# Patient Record
Sex: Female | Born: 1965 | Race: White | Hispanic: No | Marital: Married | Smoking: Never smoker
Health system: Southern US, Community
[De-identification: ages and names within clinical notes are randomized; demographics above are authoritative.]

## PROBLEM LIST (undated history)

## (undated) DIAGNOSIS — M069 Rheumatoid arthritis, unspecified: Secondary | ICD-10-CM

## (undated) DIAGNOSIS — E785 Hyperlipidemia, unspecified: Secondary | ICD-10-CM

## (undated) DIAGNOSIS — G459 Transient cerebral ischemic attack, unspecified: Secondary | ICD-10-CM

## (undated) DIAGNOSIS — F419 Anxiety disorder, unspecified: Secondary | ICD-10-CM

## (undated) DIAGNOSIS — G47419 Narcolepsy without cataplexy: Principal | ICD-10-CM

## (undated) HISTORY — PX: OTHER SURGICAL HISTORY: SHX169

## (undated) HISTORY — PX: WISDOM TOOTH EXTRACTION: SHX21

## (undated) HISTORY — DX: Hyperlipidemia, unspecified: E78.5

## (undated) HISTORY — DX: Transient cerebral ischemic attack, unspecified: G45.9

## (undated) HISTORY — DX: Narcolepsy without cataplexy: G47.419

---

## 1998-12-26 ENCOUNTER — Other Ambulatory Visit: Admission: RE | Admit: 1998-12-26 | Discharge: 1998-12-26 | Payer: Self-pay | Admitting: Obstetrics and Gynecology

## 1999-12-18 ENCOUNTER — Other Ambulatory Visit: Admission: RE | Admit: 1999-12-18 | Discharge: 1999-12-18 | Payer: Self-pay | Admitting: Obstetrics and Gynecology

## 2000-12-30 ENCOUNTER — Other Ambulatory Visit: Admission: RE | Admit: 2000-12-30 | Discharge: 2000-12-30 | Payer: Self-pay | Admitting: Obstetrics and Gynecology

## 2001-08-25 ENCOUNTER — Other Ambulatory Visit: Admission: RE | Admit: 2001-08-25 | Discharge: 2001-08-25 | Payer: Self-pay | Admitting: Obstetrics and Gynecology

## 2002-03-30 ENCOUNTER — Inpatient Hospital Stay (HOSPITAL_COMMUNITY): Admission: AD | Admit: 2002-03-30 | Discharge: 2002-04-03 | Payer: Self-pay | Admitting: Obstetrics and Gynecology

## 2002-05-01 ENCOUNTER — Other Ambulatory Visit: Admission: RE | Admit: 2002-05-01 | Discharge: 2002-05-01 | Payer: Self-pay | Admitting: Obstetrics and Gynecology

## 2002-10-17 ENCOUNTER — Emergency Department (HOSPITAL_COMMUNITY): Admission: EM | Admit: 2002-10-17 | Discharge: 2002-10-17 | Payer: Self-pay | Admitting: Emergency Medicine

## 2002-12-25 ENCOUNTER — Ambulatory Visit (HOSPITAL_COMMUNITY): Admission: RE | Admit: 2002-12-25 | Discharge: 2002-12-25 | Payer: Self-pay | Admitting: Gastroenterology

## 2003-04-30 ENCOUNTER — Ambulatory Visit (HOSPITAL_BASED_OUTPATIENT_CLINIC_OR_DEPARTMENT_OTHER): Admission: RE | Admit: 2003-04-30 | Discharge: 2003-04-30 | Payer: Self-pay | Admitting: Neurology

## 2003-05-14 ENCOUNTER — Other Ambulatory Visit: Admission: RE | Admit: 2003-05-14 | Discharge: 2003-05-14 | Payer: Self-pay | Admitting: Obstetrics and Gynecology

## 2003-08-06 ENCOUNTER — Encounter (INDEPENDENT_AMBULATORY_CARE_PROVIDER_SITE_OTHER): Payer: Self-pay | Admitting: Specialist

## 2003-08-06 ENCOUNTER — Ambulatory Visit (HOSPITAL_BASED_OUTPATIENT_CLINIC_OR_DEPARTMENT_OTHER): Admission: RE | Admit: 2003-08-06 | Discharge: 2003-08-06 | Payer: Self-pay | Admitting: Surgery

## 2003-08-06 ENCOUNTER — Ambulatory Visit (HOSPITAL_COMMUNITY): Admission: RE | Admit: 2003-08-06 | Discharge: 2003-08-06 | Payer: Self-pay | Admitting: Surgery

## 2004-08-07 ENCOUNTER — Encounter: Admission: RE | Admit: 2004-08-07 | Discharge: 2004-08-07 | Payer: Self-pay | Admitting: Gastroenterology

## 2004-09-04 ENCOUNTER — Encounter: Admission: RE | Admit: 2004-09-04 | Discharge: 2004-09-04 | Payer: Self-pay | Admitting: Gastroenterology

## 2005-07-28 ENCOUNTER — Inpatient Hospital Stay (HOSPITAL_COMMUNITY): Admission: RE | Admit: 2005-07-28 | Discharge: 2005-07-31 | Payer: Self-pay | Admitting: Obstetrics and Gynecology

## 2007-10-06 HISTORY — PX: COLONOSCOPY: SHX174

## 2008-12-11 ENCOUNTER — Encounter: Admission: RE | Admit: 2008-12-11 | Discharge: 2008-12-11 | Payer: Self-pay | Admitting: Obstetrics and Gynecology

## 2008-12-18 ENCOUNTER — Observation Stay (HOSPITAL_COMMUNITY): Admission: EM | Admit: 2008-12-18 | Discharge: 2008-12-18 | Payer: Self-pay | Admitting: Emergency Medicine

## 2010-01-14 ENCOUNTER — Encounter: Admission: RE | Admit: 2010-01-14 | Discharge: 2010-01-14 | Payer: Self-pay | Admitting: Obstetrics and Gynecology

## 2011-01-15 LAB — BASIC METABOLIC PANEL
BUN: 17 mg/dL (ref 6–23)
CO2: 28 mEq/L (ref 19–32)
Calcium: 9.1 mg/dL (ref 8.4–10.5)
Chloride: 102 mEq/L (ref 96–112)
Creatinine, Ser: 0.84 mg/dL (ref 0.4–1.2)
GFR calc Af Amer: >60 mL/min (ref >60)
GFR calc non Af Amer: >60 mL/min (ref >60)
Glucose, Bld: 73 mg/dL (ref 70–99)
Potassium: 3.3 mEq/L — ABNORMAL LOW (ref 3.5–5.1)
Sodium: 137 mEq/L (ref 135–145)

## 2011-01-15 LAB — URINE MICROSCOPIC-ADD ON

## 2011-01-15 LAB — POCT I-STAT, CHEM 8
BUN: 19 mg/dL (ref 6–23)
Calcium, Ion: 1.21 mmol/L (ref 1.12–1.32)
Chloride: 104 mEq/L (ref 96–112)
Creatinine, Ser: 1 mg/dL (ref 0.4–1.2)
Glucose, Bld: 79 mg/dL (ref 70–99)
HCT: 50 % — ABNORMAL HIGH (ref 36.0–46.0)
Hemoglobin: 17 g/dL — ABNORMAL HIGH (ref 12.0–15.0)
Potassium: 3.4 mEq/L — ABNORMAL LOW (ref 3.5–5.1)
Sodium: 137 mEq/L (ref 135–145)
TCO2: 27 mmol/L (ref 0–100)

## 2011-01-15 LAB — CBC
HCT: 47.1 % — ABNORMAL HIGH (ref 36.0–46.0)
Hemoglobin: 16.7 g/dL — ABNORMAL HIGH (ref 12.0–15.0)
MCHC: 35.5 g/dL (ref 30.0–36.0)
MCV: 85.1 fL (ref 78.0–100.0)
Platelets: 317 10*3/uL (ref 150–400)
RBC: 5.53 MIL/uL — ABNORMAL HIGH (ref 3.87–5.11)
RDW: 12 % (ref 11.5–15.5)
WBC: 8 10*3/uL (ref 4.0–10.5)

## 2011-01-15 LAB — URINALYSIS, ROUTINE W REFLEX MICROSCOPIC
Glucose, UA: NEGATIVE mg/dL
Hgb urine dipstick: NEGATIVE
Ketones, ur: 15 mg/dL — AB
Nitrite: NEGATIVE
Protein, ur: 30 mg/dL — AB
Specific Gravity, Urine: 1.038 — ABNORMAL HIGH (ref 1.005–1.030)
Urobilinogen, UA: 1 mg/dL (ref 0.0–1.0)
pH: 6.5 (ref 5.0–8.0)

## 2011-01-15 LAB — DIFFERENTIAL
Basophils Absolute: 0 10*3/uL (ref 0.0–0.1)
Basophils Relative: 1 % (ref 0–1)
Eosinophils Absolute: 0.2 10*3/uL (ref 0.0–0.7)
Eosinophils Relative: 2 % (ref 0–5)
Lymphocytes Relative: 21 % (ref 12–46)
Lymphs Abs: 1.7 10*3/uL (ref 0.7–4.0)
Monocytes Absolute: 0.8 10*3/uL (ref 0.1–1.0)
Monocytes Relative: 10 % (ref 3–12)
Neutro Abs: 5.3 10*3/uL (ref 1.7–7.7)
Neutrophils Relative %: 66 % (ref 43–77)

## 2011-01-15 LAB — MAGNESIUM: Magnesium: 1.9 mg/dL (ref 1.5–2.5)

## 2011-02-20 NOTE — H&P (Signed)
[facility]  Patient:    Kathleen Alvarez, Kathleen Alvarez Visit Number: [id number] MRN: [id number]          Service Type: OBS Location: 910B 9154 01 Attending Physician:  Frederich Balding Dictated by:   Lenoard Aden, M.D. Admit Date:  03/30/2002                           History and Physical  CHIEF COMPLAINT:              Postdate for cervical ripening and induction.  HISTORY OF PRESENT ILLNESS:   A 45 year old white female, G1, P0, EDD of June 19,2003 at 418 weeks gestation, who presents for induction.  PAST OBSTETRIC HISTORY:       Noncontributory.  MEDICATIONS:                  Prenatal vitamins.  ALLERGIES:                    No known drug allergies.  PAST MEDICAL HISTORY:         No medical or surgical hospitalizations.  FAMILY HISTORY:               Colon cancer and adult onset diabetes and hypertension.  PRENATAL LABORATORY DATA:     Blood type B+, Rh antibody negative, rubella immune, hepatitis B surface antigen negative, HIV nonreactive.  GC and Chlamydia negative.  Pregnancy complicated by postdate and questionable borderline pelvimetry.  PHYSICAL EXAMINATION:  GENERAL:                      She is a well-developed, well-nourished, obese white female in no apparent distress.  HEENT:                        Normal.  LUNGS:                        Clear.  HEART:                        Regular rate and rhythm.  ABDOMEN:                      Soft, gravid, nontender.  Estimated fetal weight of 7.5-8 pounds.  PELVIC:                       Cervix is closed, 3 cm, long, vertex, and -2.  EXTREMITIES:                  No cords.  NEUROLOGIC:                   Nonfocal.  IMPRESSION:                   Postdates intrauterine pregnancy with unfavorable cervix for cervical ripening and induction.  PLAN:                         Proceed with cervical ripening and induction. Dictated by:   Lenoard Aden, M.D. Attending Physician:  Frederich Balding DD:  03/30/02 TD:  03/30/02 Job: [id number]

## 2011-02-20 NOTE — Op Note (Signed)
[facility]  Patient:    Kathleen Alvarez, Kathleen Alvarez Visit Number: [id number] MRN: [id number]          Service Type: OBS Location: 910A 9137 01 Attending Physician:  Lenoard Aden Dictated by:   Lenoard Aden, M.D. Proc. Date: 03/31/02 Admit Date:  03/30/2002   CC:         [facility]   Operative Report  PREOPERATIVE DIAGNOSES:       Nonreassuring fetal heart rate tracing, active phase arrest, probable cephalopelvic disproportion.  POSTOPERATIVE DIAGNOSES:      Nonreassuring fetal heart rate tracing, active phase arrest, probable cephalopelvic disproportion.  PROCEDURE:                    Primary low segment transverse cesarean section.  SURGEON:                      Lenoard Aden, M.D.  ANESTHESIA:                   Epidural.  ESTIMATED BLOOD LOSS:         1200 cc.  COMPLICATIONS:                None.  DRAINS:                       Foley.  COUNTS:                       Correct.  FINDINGS:                     Full-term living female occiput anterior position.  Placenta posterior and intact.  Normal tubes and ovaries noted. Three vessel cord noted.  Apgars 8 and 9.  BRIEF OPERATIVE NOTE:         After being apprised of the risks of anesthesia, infection, bleeding, injury to abdominal organs with need for repair, the patient was brought to the operating room where she was administered additional epidural anesthetic without complications.  Prepped and draped in the usual sterile fashion.  Foley catheter previously placed and draining well.  After achieving adequate anesthesia, dilute Marcaine solution placed in the area of Pfannenstiel skin incision which is made with the scalpel and carried down to the fascia which is nicked in the midline and opened transversely using Mayo scissors.  Rectus muscles dissected sharply in the midline.  Peritoneum entered sharply.  Bladder blade placed. Visceroperitoneum is scored in a smile like  fashion.  Kerr hysterotomy incision made for atraumatic delivery of a molded fetal vertex from occiput anterior position.  Full-term living female, Apgars 8 and 9.  Handed to pediatricians who were in attendance after bulb suction.  Placenta delivered manually intact.  Three vessel cord noted.  Uterus exteriorized.  Normal tubes and ovaries noted.  Uterus curetted using a dry lap pack and good hemostasis achieved.  Bladder flap was well away from the lower aspect of the uterine incision.  Uterine incision closed using a 0 Monocryl in a continuous running fashion in two layers.  At this time pericolic gutters are irrigated.  Blood clots subsequently removed.  Two interrupted stitches placed for hemostasis along the lower uterine incision.  At this time bladder flap is reinspected and found to be hemostatic.  Fascia closed using a 0 Vicryl in a continuous running fashion.  Skin closed using staples.  The patient tolerates  procedure well and is transferred to recovery in good condition. Dictated by:   Lenoard Aden, M.D. Attending Physician:  Lenoard Aden DD:  03/31/02 TD:  04/02/02 Job: [id number]

## 2011-02-20 NOTE — Op Note (Signed)
Kathleen Alvarez, Kathleen Alvarez                ACCOUNT NO.:  [id number]   MEDICAL RECORD NO.:  [id number]          PATIENT TYPE:  INP   LOCATION:  9114                          FACILITY:  [facility]   PHYSICIAN:  Lenoard Aden, M.D.DATE OF BIRTH:  [birth year]   DATE OF PROCEDURE:  07/28/2005  DATE OF DISCHARGE:                                 OPERATIVE REPORT   PREOPERATIVE DIAGNOSIS:  39-week intrauterine pregnancy, previous cesarean  section, for repeat.   POSTOPERATIVE DIAGNOSIS:  39-week intrauterine pregnancy, previous cesarean  section, for repeat.   PROCEDURE:  Repeat low segment transverse cesarean section.   SURGEON:  Lenoard Aden, M.D.   ASSISTANT:  Richardean Sale, M.D.   ANESTHESIA:  Spinal by Raul Del, M.D.   ESTIMATED BLOOD LOSS:  500 mL.   COMPLICATIONS:  None.   DRAINS:  Foley.   COUNTS:  Correct.   DISPOSITION:  The patient went to recovery room in good condition.   FINDINGS:  Full term living female, occipitotransverse position, 6 pounds,  Apgars 8 and 9, and nuchal cord x1.  Pediatricians in attendance.  Placenta  was posterior.  Normal tubes and normal ovaries are palpable.   DESCRIPTION OF PROCEDURE:  After achieving adequate anesthesia, the patient  is prepped and draped in the usual sterile fashion and Foley catheter  placed.  Dilute Marcaine solution placed in the area of previous  Pfannenstiel. Skin incision made with the scalpel and carried down to the  fascia which was nicked in the midline and opened transversely using Mayo  scissors.  The rectus muscles were dissected sharply in the midline.  The  peritoneum was entered bluntly.  The bladder blade was placed.  The  visceroperitoneum was scored sharply using scissors and dissected sharply  off of the lower uterine segment.  Kerr hysterotomy incision was made and  extended with bandage scissors.  Atraumatic amniotomy with clear amniotic  fluid noted.  Atraumatic delivery of full term living  female as noted.  Apgars 8 and 9.  Pediatricians in attendance.  Cord blood collected.  Placenta delivered manually intact.  The uterus was curetted using a dry  laparoscopy pack and closed in two layers using a 0 Monocryl suture.  Bleeding along the right lateral edges controlled using an interrupted  suture and bleeding along the bladder flap was  controlled with a 3-0 Vicryl interrupted suture.  At this time good  hemostasis was noted and irrigation accomplished.  The fascia was then  closed using a 0 Monocryl in continuous running fashion.  Skin closed using  staples.  The patient tolerates the procedure well and is transferred to  recovery room in good condition.      Lenoard Aden, M.D.  Electronically Signed     RJT/MEDQ  D:  07/28/2005  T:  07/28/2005  Job:  [id number]

## 2011-02-20 NOTE — Op Note (Signed)
   NAMELOUVINIA, CUMBO                            ACCOUNT NO.:  [id number]   MEDICAL RECORD NO.:  [id number]                   PATIENT TYPE:  AMB   LOCATION:  DSC                                  FACILITY:  [facility]   PHYSICIAN:  Sandria Bales. Ezzard Standing, M.D.               DATE OF BIRTH:  May 26, 1966   DATE OF PROCEDURE:  08/06/2003  DATE OF DISCHARGE:                                 OPERATIVE REPORT   PREOPERATIVE DIAGNOSIS:  Lipoma, left upper arm.   POSTOPERATIVE DIAGNOSIS:  Five-centimeter lipoma of left upper arm.   PROCEDURE:  Excision of lipoma, left upper arm.   SURGEON:  Sandria Bales. Ezzard Standing, M.D.   FIRST ASSISTANT:  No first assistant.   ANESTHESIA:  MAC with approximately 19 mL of 1% Xylocaine.   COMPLICATIONS:  None.   INDICATION FOR PROCEDURE:  Ms. Stmarie is a 45 year old white female who has  a mass of her left upper arm.  She has had this for at least two years.  She  said it was biopsied about two years ago and told it was a lipoma.  It has  gotten slowly bigger and causing discomfort and she now was interested in  having this excised.   DESCRIPTION OF PROCEDURE:  The patient was placed in a supine position and  given a MAC anesthesia.  She had the left upper arm painted with Betadine  solution and sterilely draped.  I marked the mass preoperatively; it  measured about 5 to 6 cm in diameter.  I made an incision directly over the  mass.  She also had a little scar from the biopsy site that she wanted me to  excise so I excised it at the same time.  This mass actually was fairly  deep, attached to sort of the lateral part of her biceps and triceps muscle  in kind of that groove, but I excised the mass out, measuring about 5 cm in  diameter.  Hemostasis was controlled with Bovie electrocautery.  The  subcutaneous tissues were closed with 3-0 Vicryl suture, the skin with a 5-0  Vicryl suture, painted with tincture of Benzoin and steri-stripped.   The patient tolerated the  procedure well.  We discharged her home today, to  return to see me in two weeks, call for any problems.                                               Sandria Bales. Ezzard Standing, M.D.    DHN/MEDQ  D:  08/06/2003  T:  08/06/2003  Job:  [id number]   cc:   Oley Balm. Georgina Pillion, M.D.  [address]  [place]  [place] [address]  Fax: [phone]

## 2011-02-20 NOTE — Discharge Summary (Signed)
[facility]  Patient:    SHERIECE, JEFCOAT Visit Number: [id number] MRN: [id number]          Service Type: OBS Location: 910A 9137 01 Attending Physician:  Lenoard Aden Dictated by:   Lenoard Aden, M.D. Admit Date:  03/30/2002 Discharge Date: 04/03/2002                             Discharge Summary  The patient underwent cervical ripening and induction for postdates and had a primary cesarean section for nonreassuring fetal heart rate tracing and active phase arrest on March 31, 2002, without complications.  Postoperative course without difficulty.  Hemoglobin 11.4.  Discharged to home on day #3, tolerated a regular diet without difficulty.  DISCHARGE MEDICATIONS:        1. Tylox #30.                               2. Motrin 600 mg #30 given.  FOLLOW-UP:                    In the office in four to six weeks.  Discharge teaching done. Dictated by:   Lenoard Aden, M.D. Attending Physician:  Lenoard Aden DD:  04/03/02 TD:  04/04/02 Job: [id number]

## 2011-02-20 NOTE — Discharge Summary (Signed)
Kathleen Alvarez, Kathleen Alvarez                ACCOUNT NO.:  [id number]   MEDICAL RECORD NO.:  [id number]          PATIENT TYPE:  INP   LOCATION:  9114                          [facility]   PHYSICIAN:  Lenoard Aden, M.D.DATE OF BIRTH:  05-17-1966   DATE OF ADMISSION:  07/28/2005  DATE OF DISCHARGE:  07/31/2005                                 DISCHARGE SUMMARY   The patient underwent uncomplicated repeat cesarean section July 28, 2005. Postoperative course uncomplicated. Tolerated a regular diet well.  Discharged to home day 3.  Tylox, prenatal vitamins and iron given. Follow  up in the office four to six weeks.  Discharge teaching done.      Lenoard Aden, M.D.  Electronically Signed     RJT/MEDQ  D:  09/17/2005  T:  09/18/2005  Job:  [id number]

## 2011-02-20 NOTE — Op Note (Signed)
Kathleen Alvarez, Kathleen Alvarez                            ACCOUNT NO.:  [id number]   MEDICAL RECORD NO.:  [id number]                   PATIENT TYPE:  AMB   LOCATION:  ENDO                                 FACILITY:  [facility]   PHYSICIAN:  Anselmo Rod, M.D.               DATE OF BIRTH:  [birth year]   DATE OF PROCEDURE:  12/25/2002  DATE OF DISCHARGE:                                 OPERATIVE REPORT   PROCEDURE:  Colonoscopy.   ENDOSCOPIST:  Charna Elizabeth, M.D.   INSTRUMENT USED:  Pediatric adjustable Olympus colonoscope.   INDICATIONS FOR PROCEDURE:  A 45 year old white female with history of  rectal bleeding and family history of colon cancer in her father.  Rule out  colonic polyps, masses, etc.   PREPROCEDURE PREPARATION:  Informed consent was procured from the patient.  The patient was fasted for eight hours prior to the procedure and prepped  with a bottle of magnesium citrate and GoLYTELY the night prior to the  procedure.   PREPROCEDURE PHYSICAL:  VITAL SIGNS:  The patient had stable vital signs.  NECK:  Supple.  CHEST:  Clear to auscultation.  CARDIAC:  S1 and S2 regular.  ABDOMEN:  Soft with normal bowel sounds.   DESCRIPTION OF PROCEDURE:  The patient was placed in the left lateral  decubitus position and sedated with 70 mg of Demerol and 7.5 mg of Versed  intravenously.  Once the patient was adequately sedated, maintained on low  flow oxygen and continuous cardiac monitoring, the Olympus video colonoscope  was advanced from the rectum to the cecum with difficulty.  There was some  residual stool especially in the right colon.  Multiple washes were done.  The cecum was completely obliterated by stool and therefore, after washing  liberally, basically there was no change.  The patient's position was  changed to the supine position.  The appendiceal orifice and ileocecal valve  were visualized and photographed.  The terminal ileum appeared normal.  A  small amount of bleeding  internal hemorrhoids were seen on retroflexion.  No  other abnormalities were noted.  No masses or polyps were seen.  The patient  tolerated the procedure well without complications.   IMPRESSION:  1. Normal colonoscopy up to the terminal ileum except for small amount of     bleeding internal hemorrhoids.  2. No masses or polyps.  3. Some residual stool in the right colon __________.   RECOMMENDATIONS:  1. High fiber diet was discussed with the patient.  Brochures were given for     education.  2.     Repeat colorectal cancer screening is recommended in five years unless the     patient develops any abnormal symptoms in the interim.  3. Outpatient followup on a p.r.n. basis.  Anselmo Rod, M.D.    JNM/MEDQ  D:  12/25/2002  T:  12/25/2002  Job:  [id number]   cc:   Schuyler Amor, M.D.  [address]  [place], [place] [address]  Fax: [phone]

## 2011-02-24 ENCOUNTER — Other Ambulatory Visit: Payer: Self-pay | Admitting: Obstetrics and Gynecology

## 2011-02-24 DIAGNOSIS — Z1231 Encounter for screening mammogram for malignant neoplasm of breast: Secondary | ICD-10-CM

## 2011-03-10 ENCOUNTER — Ambulatory Visit
Admission: RE | Admit: 2011-03-10 | Discharge: 2011-03-10 | Disposition: A | Discharge status: Home / Self Care | Payer: BC Managed Care – PPO | Source: Ambulatory Visit | Attending: Obstetrics and Gynecology | Admitting: Obstetrics and Gynecology

## 2011-03-10 DIAGNOSIS — Z1231 Encounter for screening mammogram for malignant neoplasm of breast: Secondary | ICD-10-CM

## 2012-03-24 ENCOUNTER — Other Ambulatory Visit: Payer: Self-pay | Admitting: Obstetrics and Gynecology

## 2012-03-24 DIAGNOSIS — Z1231 Encounter for screening mammogram for malignant neoplasm of breast: Secondary | ICD-10-CM

## 2012-04-05 ENCOUNTER — Ambulatory Visit
Admission: RE | Admit: 2012-04-05 | Discharge: 2012-04-05 | Disposition: A | Discharge status: Home / Self Care | Payer: BC Managed Care – PPO | Source: Ambulatory Visit | Attending: Obstetrics and Gynecology | Admitting: Obstetrics and Gynecology

## 2012-04-05 DIAGNOSIS — Z1231 Encounter for screening mammogram for malignant neoplasm of breast: Secondary | ICD-10-CM

## 2012-04-13 ENCOUNTER — Ambulatory Visit (INDEPENDENT_AMBULATORY_CARE_PROVIDER_SITE_OTHER): Payer: BC Managed Care – PPO | Admitting: Sports Medicine

## 2012-04-13 VITALS — BP 107/75 | Ht 63.0 in | Wt 150.0 lb

## 2012-04-13 DIAGNOSIS — S86119A Strain of other muscle(s) and tendon(s) of posterior muscle group at lower leg level, unspecified leg, initial encounter: Secondary | ICD-10-CM

## 2012-04-13 DIAGNOSIS — S838X9A Sprain of other specified parts of unspecified knee, initial encounter: Secondary | ICD-10-CM

## 2012-04-13 DIAGNOSIS — S86819A Strain of other muscle(s) and tendon(s) at lower leg level, unspecified leg, initial encounter: Secondary | ICD-10-CM

## 2012-04-13 NOTE — Progress Notes (Signed)
  Subjective:    Patient ID: Kathleen Alvarez, female    DOB: 05-22-1966, 46 y.o.   MRN: [id number]  HPI  patient is a 46 year old female that comes in today complaining of 2 months of left calf pain. She is a sprint triathlete and initially noticed pain with running 2 months ago. She describes a pulling type sensation along the medial aspect of her proximal calf. She to go week off from running and her symptoms improved. 3 weeks ago she reinjured her calf while doing speed work. She had some swelling and bruising at the time which is improved. She's not run since. 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 She denies any numbness or tingling. No pain brought more proximally in her knee.  Medications include of Vyvance and Effexor. No known drug allergies. Socially she does not smoke, does not drink, and works as an Research scientist (medical) for [employer].  Review of Systems     Objective:   Physical Exam  Well-developed well-nourished. No acute distress, awake alert and oriented x3. Examination of the left calf shows tenderness to palpation along the medial head of the gastrocnemius proximally. No soft tissue swelling, no ecchymosis. No palpable defect. No tenderness to palpation along the lateral calf. Mild pain with Achilles stretches. Evaluation of her running form shows her to be a forefoot Striker lens and varus. She runs without a limp but did get some discomfort in the calf immediately afterwards.  Muscle skeletal ultrasound: Examination of the left calf was performed. Images were obtained in both the transit and longitudinal positions. She has extensive hypo-echogenicity and increased Doppler flow in both the mid to proximal portions of the medial head of the gastrocnemius as well as the underlying soleus muscle. This likely represents partial tearing.      Assessment & Plan:  #1. Strain with MSK ultrasound evidence of  probable partial tear of the medial head of the gastrocnemius  Sports insoles with a 5/16 inch heel lift. Body helix sleeve to wear with activity. I recommended that she continue to cross train for an additional 2-3 weeks and then gradually increase her running. I cautioned her about stretching and injured muscle. She will start decentered strengthening exercises when comfortable. She'll followup for ongoing or recalcitrant issues.

## 2012-07-11 ENCOUNTER — Encounter (HOSPITAL_COMMUNITY): Payer: Self-pay | Admitting: *Deleted

## 2012-07-12 ENCOUNTER — Other Ambulatory Visit: Payer: Self-pay | Admitting: Obstetrics and Gynecology

## 2012-07-16 ENCOUNTER — Encounter (HOSPITAL_COMMUNITY): Payer: Self-pay | Admitting: Pharmacist

## 2012-07-29 ENCOUNTER — Ambulatory Visit (HOSPITAL_COMMUNITY)
Admission: RE | Admit: 2012-07-29 | Payer: BC Managed Care – PPO | Source: Ambulatory Visit | Admitting: Obstetrics and Gynecology

## 2012-07-29 ENCOUNTER — Encounter (HOSPITAL_COMMUNITY): Admission: RE | Payer: Self-pay | Source: Ambulatory Visit

## 2012-07-29 HISTORY — DX: Anxiety disorder, unspecified: F41.9

## 2012-07-29 SURGERY — DILATATION & CURETTAGE/HYSTEROSCOPY WITH NOVASURE ABLATION
Anesthesia: Choice

## 2012-08-25 NOTE — H&P (Signed)
Kathleen Alvarez, Kathleen Alvarez                ACCOUNT NO.:  [id number]  MEDICAL RECORD NO.:  [id number]  LOCATION:  PERIO                         [facility]  PHYSICIAN:  Lenoard Aden, M.D.DATE OF BIRTH:  May 03, 1966  DATE OF ADMISSION:  08/09/2012 DATE OF DISCHARGE:                             HISTORY & PHYSICAL   CHIEF COMPLAINT:  Refractory menorrhagia.  HISTORY OF PRESENT ILLNESS:  She is a 46 year old white female, G2, P2, who presents for endometrial ablation.  She has a history of C-section x2.  ALLERGIES:  MONISTAT.  MEDICATIONS:  Vyvanse, Effexor, and multivitamin.  FAMILY HISTORY:  She has a family history of colon cancer, liver cancer, and diabetes.  PHYSICAL EXAMINATION:  GENERAL:  She is a well-developed, well-nourished white female, in no acute distress. VITAL SIGNS: stable HEENT:  Normal. NECK:  Supple, full range of motion. LUNGS:  Clear. HEART:  Regular rhythm. ABDOMEN:  Soft and nontender. PELVIC:  Reveals normal size, shape, and contour uterus with no adnexal masses.  IMPRESSION:  Refractory menorrhagia for endometrial ablation.  PLAN:  Proceed with NovaSure endometrial ablation.  Risks include anesthesia, infection, bleeding, injury to abdominal organs, need for repair was discussed, delayed versus immediate complications to include bowel and bladder injury, possible need for repair noted.  Failure risk of NovaSure ablation discussed.  The patient acknowledges and wishes to proceed.     Lenoard Aden, M.D.     RJT/MEDQ  D:  08/25/2012  T:  08/25/2012  Job:  [id number]

## 2012-08-26 ENCOUNTER — Encounter (HOSPITAL_COMMUNITY)
Admission: RE | Disposition: A | Discharge status: Home / Self Care | Payer: Self-pay | Source: Ambulatory Visit | Attending: Obstetrics and Gynecology

## 2012-08-26 ENCOUNTER — Encounter (HOSPITAL_COMMUNITY): Payer: Self-pay | Admitting: Anesthesiology

## 2012-08-26 ENCOUNTER — Encounter (HOSPITAL_COMMUNITY): Payer: Self-pay | Admitting: *Deleted

## 2012-08-26 ENCOUNTER — Ambulatory Visit (HOSPITAL_COMMUNITY): Payer: BC Managed Care – PPO | Admitting: Anesthesiology

## 2012-08-26 ENCOUNTER — Ambulatory Visit (HOSPITAL_COMMUNITY)
Admission: RE | Admit: 2012-08-26 | Discharge: 2012-08-26 | Disposition: A | Discharge status: Home / Self Care | Payer: BC Managed Care – PPO | Source: Ambulatory Visit | Attending: Obstetrics and Gynecology | Admitting: Obstetrics and Gynecology

## 2012-08-26 DIAGNOSIS — N920 Excessive and frequent menstruation with regular cycle: Secondary | ICD-10-CM | POA: Insufficient documentation

## 2012-08-26 HISTORY — PX: DILITATION & CURRETTAGE/HYSTROSCOPY WITH NOVASURE ABLATION: SHX5568

## 2012-08-26 LAB — CBC
HCT: 41.7 % (ref 36.0–46.0)
Hemoglobin: 14.3 g/dL (ref 12.0–15.0)
MCH: 29.8 pg (ref 26.0–34.0)
MCHC: 34.3 g/dL (ref 30.0–36.0)
MCV: 86.9 fL (ref 78.0–100.0)
Platelets: 324 10*3/uL (ref 150–400)
RBC: 4.8 MIL/uL (ref 3.87–5.11)
RDW: 11.5 % (ref 11.5–15.5)
WBC: 8.8 10*3/uL (ref 4.0–10.5)

## 2012-08-26 LAB — HCG, SERUM, QUALITATIVE: Preg, Serum: NEGATIVE

## 2012-08-26 SURGERY — DILATATION & CURETTAGE/HYSTEROSCOPY WITH NOVASURE ABLATION
Anesthesia: General | Wound class: Clean Contaminated

## 2012-08-26 MED ORDER — CEFAZOLIN SODIUM-DEXTROSE 2-3 GM-% IV SOLR
2.0000 g | INTRAVENOUS | Status: AC
  Administered 2012-08-26: 2 g via INTRAVENOUS

## 2012-08-26 MED ORDER — BUPIVACAINE HCL (PF) 0.25 % IJ SOLN
INTRAMUSCULAR | Status: AC
  Filled 2012-08-26: qty 30

## 2012-08-26 MED ORDER — LACTATED RINGERS IV SOLN
INTRAVENOUS | Status: DC
  Administered 2012-08-26: 50 mL/h via INTRAVENOUS
  Administered 2012-08-26: 16:00:00 via INTRAVENOUS

## 2012-08-26 MED ORDER — MIDAZOLAM HCL 2 MG/2ML IJ SOLN
INTRAMUSCULAR | Status: AC
  Filled 2012-08-26: qty 2

## 2012-08-26 MED ORDER — PROPOFOL 10 MG/ML IV EMUL
INTRAVENOUS | Status: AC
  Filled 2012-08-26: qty 20

## 2012-08-26 MED ORDER — ONDANSETRON HCL 4 MG/2ML IJ SOLN: 4.0000 mg | Freq: Once | INTRAMUSCULAR | Status: DC | PRN

## 2012-08-26 MED ORDER — FENTANYL CITRATE 0.05 MG/ML IJ SOLN
INTRAMUSCULAR | Status: DC | PRN
  Administered 2012-08-26 (×2): 50 ug via INTRAVENOUS

## 2012-08-26 MED ORDER — BUPIVACAINE HCL (PF) 0.25 % IJ SOLN
INTRAMUSCULAR | Status: DC | PRN
  Administered 2012-08-26: 20 mL

## 2012-08-26 MED ORDER — ONDANSETRON HCL 4 MG/2ML IJ SOLN
INTRAMUSCULAR | Status: DC | PRN
  Administered 2012-08-26: 4 mg via INTRAVENOUS

## 2012-08-26 MED ORDER — LACTATED RINGERS IR SOLN
Status: DC | PRN
  Administered 2012-08-26: 3000 mL

## 2012-08-26 MED ORDER — DEXAMETHASONE SODIUM PHOSPHATE 10 MG/ML IJ SOLN
INTRAMUSCULAR | Status: AC
  Filled 2012-08-26: qty 1

## 2012-08-26 MED ORDER — ONDANSETRON HCL 4 MG/2ML IJ SOLN
INTRAMUSCULAR | Status: AC
  Filled 2012-08-26: qty 2

## 2012-08-26 MED ORDER — LIDOCAINE HCL (CARDIAC) 20 MG/ML IV SOLN
INTRAVENOUS | Status: DC | PRN
  Administered 2012-08-26: 60 mg via INTRAVENOUS

## 2012-08-26 MED ORDER — MIDAZOLAM HCL 5 MG/5ML IJ SOLN
INTRAMUSCULAR | Status: DC | PRN
  Administered 2012-08-26: 2 mg via INTRAVENOUS

## 2012-08-26 MED ORDER — PROPOFOL 10 MG/ML IV EMUL
INTRAVENOUS | Status: DC | PRN
  Administered 2012-08-26: 150 mg via INTRAVENOUS

## 2012-08-26 MED ORDER — OXYCODONE-ACETAMINOPHEN 5-325 MG PO TABS: 1.0000 | ORAL_TABLET | ORAL | Status: DC | PRN

## 2012-08-26 MED ORDER — KETOROLAC TROMETHAMINE 30 MG/ML IJ SOLN: 15.0000 mg | Freq: Once | INTRAMUSCULAR | Status: DC | PRN

## 2012-08-26 MED ORDER — FENTANYL CITRATE 0.05 MG/ML IJ SOLN
25.0000 ug | INTRAMUSCULAR | Status: DC | PRN
  Administered 2012-08-26: 50 ug via INTRAVENOUS
  Administered 2012-08-26: 25 ug via INTRAVENOUS

## 2012-08-26 MED ORDER — CEFAZOLIN SODIUM-DEXTROSE 2-3 GM-% IV SOLR
INTRAVENOUS | Status: AC
  Filled 2012-08-26: qty 50

## 2012-08-26 MED ORDER — FENTANYL CITRATE 0.05 MG/ML IJ SOLN
INTRAMUSCULAR | Status: AC
  Filled 2012-08-26: qty 2

## 2012-08-26 MED ORDER — LIDOCAINE HCL (CARDIAC) 20 MG/ML IV SOLN
INTRAVENOUS | Status: AC
  Filled 2012-08-26: qty 5

## 2012-08-26 MED ORDER — DEXAMETHASONE SODIUM PHOSPHATE 4 MG/ML IJ SOLN
INTRAMUSCULAR | Status: DC | PRN
  Administered 2012-08-26: 4 mg via INTRAVENOUS

## 2012-08-26 MED ORDER — MEPERIDINE HCL 25 MG/ML IJ SOLN: 6.2500 mg | INTRAMUSCULAR | Status: DC | PRN

## 2012-08-26 SURGICAL SUPPLY — 13 items
ABLATOR ENDOMETRIAL BIPOLAR (ABLATOR) ×2 IMPLANT
CATH ROBINSON RED A/P 16FR (CATHETERS) ×2 IMPLANT
CLOTH BEACON ORANGE TIMEOUT ST (SAFETY) ×2 IMPLANT
CONTAINER PREFILL 10% NBF 60ML (FORM) ×4 IMPLANT
DRESSING TELFA 8X3 (GAUZE/BANDAGES/DRESSINGS) ×2 IMPLANT
GLOVE BIO SURGEON STRL SZ7.5 (GLOVE) ×4 IMPLANT
GOWN STRL REIN XL XLG (GOWN DISPOSABLE) ×4 IMPLANT
PACK HYSTEROSCOPY LF (CUSTOM PROCEDURE TRAY) ×2 IMPLANT
PAD OB MATERNITY 4.3X12.25 (PERSONAL CARE ITEMS) ×2 IMPLANT
PAD PREP 24X48 CUFFED NSTRL (MISCELLANEOUS) ×2 IMPLANT
SYR TB 1ML 25GX5/8 (SYRINGE) ×2 IMPLANT
TOWEL OR 17X24 6PK STRL BLUE (TOWEL DISPOSABLE) ×4 IMPLANT
WATER STERILE IRR 1000ML POUR (IV SOLUTION) ×2 IMPLANT

## 2012-08-26 NOTE — Anesthesia Preprocedure Evaluation (Signed)
Anesthesia Evaluation  Patient identified by MRN, date of birth, ID band Patient awake    Reviewed: Allergy & Precautions, H&P , NPO status , Patient's Chart, lab work & pertinent test results  Airway Mallampati: I TM Distance: >3 FB Neck ROM: full    Dental No notable dental hx. (+) Teeth Intact   Pulmonary neg pulmonary ROS,    Pulmonary exam normal       Cardiovascular negative cardio ROS      Neuro/Psych negative neurological ROS  negative psych ROS   GI/Hepatic negative GI ROS, Neg liver ROS,   Endo/Other  negative endocrine ROS  Renal/GU negative Renal ROS  negative genitourinary   Musculoskeletal negative musculoskeletal ROS (+)   Abdominal Normal abdominal exam  (+)   Peds negative pediatric ROS (+)  Hematology negative hematology ROS (+)   Anesthesia Other Findings   Reproductive/Obstetrics negative OB ROS                           Anesthesia Physical  Anesthesia Plan  ASA: I  Anesthesia Plan: General   Post-op Pain Management:    Induction: Intravenous  Airway Management Planned: LMA  Additional Equipment:   Intra-op Plan:   Post-operative Plan:   Informed Consent: I have reviewed the patients History and Physical, chart, labs and discussed the procedure including the risks, benefits and alternatives for the proposed anesthesia with the patient or authorized representative who has indicated his/her understanding and acceptance.     Plan Discussed with: CRNA and Surgeon  Anesthesia Plan Comments:         Anesthesia Quick Evaluation  

## 2012-08-26 NOTE — Preoperative (Signed)
Beta Blockers   Reason not to administer Beta Blockers:Not Applicable 

## 2012-08-26 NOTE — Progress Notes (Signed)
Patient ID: Kathleen Alvarez, female   DOB: March 15, 1966, 46 y.o.   MRN: [id number] Patient seen and examined. Consent witnessed and signed. No changes noted. Update completed.

## 2012-08-26 NOTE — Anesthesia Postprocedure Evaluation (Signed)
Anesthesia Post Note  Patient: Kathleen Alvarez  Procedure(s) Performed: Procedure(s) (LRB): DILATATION & CURETTAGE/HYSTEROSCOPY WITH NOVASURE ABLATION (N/A)  Anesthesia type: General  Patient location: PACU  Post pain: Pain level controlled  Post assessment: Post-op Vital signs reviewed  Last Vitals:  Filed Vitals:   08/26/12 1439  BP: 118/82  Pulse: 69  Temp: 37 C  Resp: 18    Post vital signs: Reviewed  Level of consciousness: sedated  Complications: No apparent anesthesia complicationsfj

## 2012-08-26 NOTE — Op Note (Signed)
08/26/2012  3:48 PM  PATIENT:  Kathleen Alvarez  46 y.o. female  PRE-OPERATIVE DIAGNOSIS:  Menorrhagia  POST-OPERATIVE DIAGNOSIS:  Menorrhagia  PROCEDURE:  Procedure(s): DILATATION & CURETTAGE/HYSTEROSCOPY WITH NOVASURE ABLATION  SURGEON:  Surgeon(s): Lenoard Aden, MD  ASSISTANTS: none   ANESTHESIA:   local and general  ESTIMATED BLOOD LOSS: less than 50 cc  DRAINS: none   LOCAL MEDICATIONS USED:  MARCAINE     SPECIMEN:  Source of Specimen:  [facility]  DISPOSITION OF SPECIMEN:  PATHOLOGY  COUNTS:  YES  DICTATION #: [id number]  PLAN OF CARE: DC home  PATIENT DISPOSITION:  PACU - hemodynamically stable.

## 2012-08-26 NOTE — Transfer of Care (Signed)
Immediate Anesthesia Transfer of Care Note  Patient: Kathleen Alvarez  Procedure(s) Performed: Procedure(s) (LRB) with comments: DILATATION & CURETTAGE/HYSTEROSCOPY WITH NOVASURE ABLATION (N/A)  Patient Location: PACU  Anesthesia Type:General  Level of Consciousness: awake, alert , oriented and patient cooperative  Airway & Oxygen Therapy: Patient Spontanous Breathing and Patient connected to nasal cannula oxygen  Post-op Assessment: Report given to PACU RN and Post -op Vital signs reviewed and stable  Post vital signs: Reviewed and stable  Complications: No apparent anesthesia complications

## 2012-08-27 NOTE — Op Note (Signed)
Kathleen Alvarez, NALLEY                ACCOUNT NO.:  [id number]  MEDICAL RECORD NO.:  [id number]  LOCATION:  [facility]                          FACILITY:  [facility]  PHYSICIAN:  Lenoard Aden, M.D.DATE OF BIRTH:  [birth year]  DATE OF PROCEDURE:  08/26/2012 DATE OF DISCHARGE:  08/26/2012                              OPERATIVE REPORT   DESCRIPTION OF PROCEDURE:  After consents were signed, the patient was brought to the operating room where she was administered general anesthetic without complications.  Feet were placed in Yellofin stirrups.  She was prepped and draped in usual sterile fashion, catheterized until the bladder was empty.  Exam under anesthesia reveals a small anteflexed uterus and no adnexal masses.  At this time, the dilute Marcaine solution was placed.  Standard paracervical block, 20 mL total.  Cervix dilated up to 23 Pratt dilator.  Hysteroscope placed. 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  At this time, the endometrial curettings are collected using sharp curettage in a 4-quadrant method.  The NovaSure device was then placed seated to a length of 5.5 and a width of 2.6.  The device was initiated to a power of 100 watts for 1 minute and 28 seconds without complication.  Device removed, inspected, and found to be intact.  Revisualization revealed slight thinning of the endometrium along the left fundal contour, but no evidence of uterine perforation.  Good hemostasis was noted.  All devices were removed. Fluid deficit of 25 mL total is noted.  The patient tolerated the procedure well, was awakened, and transferred to recovery in good condition.     Lenoard Aden, M.D.     RJT/MEDQ  D:  08/26/2012  T:  08/27/2012  Job:  [id number]

## 2012-08-29 ENCOUNTER — Encounter (HOSPITAL_COMMUNITY): Payer: Self-pay | Admitting: Obstetrics and Gynecology

## 2012-09-16 ENCOUNTER — Other Ambulatory Visit: Payer: Self-pay

## 2012-12-29 ENCOUNTER — Other Ambulatory Visit: Payer: Self-pay

## 2012-12-29 MED ORDER — LISDEXAMFETAMINE DIMESYLATE 70 MG PO CAPS: 70.0000 mg | ORAL_CAPSULE | Freq: Every day | ORAL | Status: DC

## 2012-12-29 NOTE — Telephone Encounter (Signed)
Patient  Calling to request Vyvanse refill.  Dr Vickey Huger is out of the office.  Dr Eusebio Me

## 2013-02-02 ENCOUNTER — Other Ambulatory Visit: Payer: Self-pay

## 2013-02-02 MED ORDER — LISDEXAMFETAMINE DIMESYLATE 70 MG PO CAPS: 70.0000 mg | ORAL_CAPSULE | Freq: Every day | ORAL | Status: DC

## 2013-02-02 NOTE — Telephone Encounter (Signed)
Patient called requesting refill on Vyvanse.  She would like to pick it up when ready.

## 2013-03-01 ENCOUNTER — Other Ambulatory Visit: Payer: Self-pay

## 2013-03-01 MED ORDER — LISDEXAMFETAMINE DIMESYLATE 70 MG PO CAPS: 70.0000 mg | ORAL_CAPSULE | Freq: Every day | ORAL | Status: DC

## 2013-03-02 NOTE — Telephone Encounter (Signed)
Rx ready for pick up.  I called patient.  Got no answer.  Left message.

## 2013-03-31 ENCOUNTER — Other Ambulatory Visit: Payer: Self-pay

## 2013-03-31 MED ORDER — LISDEXAMFETAMINE DIMESYLATE 70 MG PO CAPS: 70.0000 mg | ORAL_CAPSULE | Freq: Every day | ORAL | Status: DC

## 2013-03-31 NOTE — Telephone Encounter (Signed)
Patient called requesting a refill on Vyvanse.  She would like to pick Rx up when it's ready.  Call back number [phone].

## 2013-05-01 ENCOUNTER — Other Ambulatory Visit: Payer: Self-pay

## 2013-05-01 MED ORDER — LISDEXAMFETAMINE DIMESYLATE 70 MG PO CAPS: 70.0000 mg | ORAL_CAPSULE | Freq: Every day | ORAL | Status: DC

## 2013-05-01 NOTE — Telephone Encounter (Signed)
Patient called requesting a refill on Vyvanse.  She would like to pick up the Rx when it's ready.  Call back number [phone].

## 2013-05-17 ENCOUNTER — Other Ambulatory Visit: Payer: Self-pay | Admitting: Gastroenterology

## 2013-05-23 ENCOUNTER — Other Ambulatory Visit: Payer: Self-pay

## 2013-05-31 ENCOUNTER — Other Ambulatory Visit: Payer: Self-pay

## 2013-05-31 MED ORDER — LISDEXAMFETAMINE DIMESYLATE 70 MG PO CAPS: 70.0000 mg | ORAL_CAPSULE | Freq: Every day | ORAL | Status: DC

## 2013-05-31 NOTE — Telephone Encounter (Signed)
Patient called requesting a refill on Vyvanse.  She would like to pick up the Rx when it's ready.  Call back number [phone].   

## 2013-06-01 NOTE — Telephone Encounter (Signed)
Rx signed, ready for pick up.  I called the patient, got no answer.  Left message.

## 2013-06-27 ENCOUNTER — Other Ambulatory Visit: Payer: Self-pay

## 2013-06-27 MED ORDER — LISDEXAMFETAMINE DIMESYLATE 70 MG PO CAPS: 70.0000 mg | ORAL_CAPSULE | Freq: Every day | ORAL | Status: DC

## 2013-06-27 NOTE — Telephone Encounter (Signed)
Patient called requesting a refill on Vyvanse.  She would like to pick up the Rx when it's ready.  Call back number [phone].   

## 2013-06-28 NOTE — Telephone Encounter (Signed)
Rx signed, ready for pick up.  I called the patient.  Got no answer.  Left message.  

## 2013-07-04 ENCOUNTER — Telehealth: Payer: Self-pay

## 2013-07-04 NOTE — Telephone Encounter (Signed)
Patient called stating Vyvanse requires Prior Auth through ins.  I have submitted all required paperwork to ins, pending response.

## 2013-08-01 ENCOUNTER — Other Ambulatory Visit: Payer: Self-pay

## 2013-08-01 NOTE — Telephone Encounter (Signed)
Patient called requesting a refill on Vyvanse.  She would like to pick up the Rx when it's ready.  Call back number [phone].

## 2013-08-02 MED ORDER — LISDEXAMFETAMINE DIMESYLATE 70 MG PO CAPS: 70.0000 mg | ORAL_CAPSULE | Freq: Every day | ORAL | Status: DC

## 2013-08-03 ENCOUNTER — Telehealth: Payer: Self-pay | Admitting: Neurology

## 2013-08-03 NOTE — Telephone Encounter (Signed)
Call pt and left a message notifying her that her prescription was ready to be picked up, and if she had any questions or concerns to call the office.

## 2013-08-30 ENCOUNTER — Other Ambulatory Visit: Payer: Self-pay

## 2013-08-30 MED ORDER — LISDEXAMFETAMINE DIMESYLATE 70 MG PO CAPS: 70.0000 mg | ORAL_CAPSULE | Freq: Every day | ORAL | Status: DC

## 2013-08-30 NOTE — Telephone Encounter (Signed)
Patient called requesting a refill on Vyvanse.  She would like to pick up the Rx when it's ready.  Call back number [phone].   

## 2013-08-30 NOTE — Telephone Encounter (Signed)
I called and left patient a VM that her Rx will be at the front desk today for pick up.

## 2013-08-30 NOTE — Telephone Encounter (Signed)
I called patient back and left VM that we are closing the office now at 4:30 p.m. We will reopen on Monday and she will be able to pick up her Rx then.

## 2013-09-19 ENCOUNTER — Encounter (INDEPENDENT_AMBULATORY_CARE_PROVIDER_SITE_OTHER): Payer: Self-pay

## 2013-09-19 ENCOUNTER — Encounter: Payer: Self-pay | Admitting: Neurology

## 2013-09-19 ENCOUNTER — Ambulatory Visit (INDEPENDENT_AMBULATORY_CARE_PROVIDER_SITE_OTHER): Payer: BC Managed Care – PPO | Admitting: Neurology

## 2013-09-19 VITALS — BP 104/72 | HR 86 | Resp 15 | Ht 63.5 in | Wt 154.0 lb

## 2013-09-19 DIAGNOSIS — G47419 Narcolepsy without cataplexy: Secondary | ICD-10-CM

## 2013-09-19 MED ORDER — LISDEXAMFETAMINE DIMESYLATE 70 MG PO CAPS: 70.0000 mg | ORAL_CAPSULE | Freq: Every day | ORAL | Status: DC

## 2013-09-19 NOTE — Progress Notes (Signed)
[facility]  Provider:  Melvyn Novas, M D  Referring Provider: No ref. provider found Primary Care Physician:  No primary provider on file.  Chief Complaint  Patient presents with  . Follow-up    Room 10  . Fatigue    HPI:  Kathleen Alvarez is a 47 y.o. female  Is seen here as a revisit for education refill.  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 She had this condition since college years and was first seen in this practice of [facility] years ago. For the treatment of for hypersomnia she was started on Provigil but at type but this in 6 months he has noted some irritability and anger on that medication this in addition was more complicated as the patient became pregnant. Provigil was therefore discontinued and she was started on Adderall trooper who vertebral much better for about a year she was eventually changed to Vyvanse in 02/20/2007 . she felt normal , ha no further issues of anger or impulsivity and has the needed  energy . She is taking a dose of 70 mg daily.   Review of Systems: Out of a complete 14 system review, the patient complains of only the following symptoms, and all other reviewed systems are negative. Asians and Doris some environmental allergies daytime sleepiness and sleep talking. She also underwent a breast reduction surgery in December 2013 at her last surgical procedure. She said that there were no changes in her immediate family medical history.  The patient social history:  she is employed at [place] to [employer]. She is married with 2 children does not use to buckle, all or illicit drugs she drinks about one cup of coffee a day.  Patient's father died in 2012/02/20 he had survived colon cancer but died of liver cancer or metastasis. He also was a diabetic. The patient's mother has high cholesterol. Paternal uncle  has sleep apnea but narcolepsy or narcolepsy like hypersomnia is not known in the family.  Epworth sleepiness score on Vyvanse was endorsed at 7 points the Epworth sleepiness score without medication was endorsed at 20 points.    History   Social History  . Marital Status: Married    Spouse Name: [name]     Number of Children: 2  . 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 Smoking status: Never Smoker   . Smokeless tobacco: Never Used  . Alcohol Use: No  . Drug Use: No  . Sexual Activity: Not on file   Other Topics Concern  . Not on file   Social History Narrative   Patient is employed.    Patient married with 2 children.          History reviewed. No pertinent family history.  Past Medical History  Diagnosis Date  . Anxiety     Past Surgical History  Procedure Laterality Date  . Cesarean section      x2  . Lipoma     . Wisdom tooth extraction    . Colonoscopy  20-Feb-2008    family history of colon ca  . Dilitation & currettage/hystroscopy with novasure ablation  08/26/2012    Procedure: DILATATION & CURETTAGE/HYSTEROSCOPY WITH NOVASURE ABLATION;  Surgeon: Lenoard Aden, MD;  Location: [facility];  Service: Gynecology;  Laterality: N/A;  Current Outpatient Prescriptions  Medication Sig Dispense Refill  . albuterol (PROVENTIL HFA;VENTOLIN HFA) 108 (90 BASE) MCG/ACT inhaler Inhale 2 puffs into the lungs every 6 (six) hours as needed. For exercise-induced bronchospasm      . cholecalciferol (VITAMIN D) 1000 UNITS tablet Take 1,000 Units by mouth daily.      Marland Kitchen ibuprofen (ADVIL,MOTRIN) 200 MG tablet Take 800 mg by mouth every 6 (six) hours as needed. For muscle soreness      . lisdexamfetamine (VYVANSE) 70 MG capsule Take 1 capsule (70 mg total) by mouth daily.  30 capsule  0  . Multiple Vitamin (MULTIVITAMIN WITH MINERALS) TABS Take 1 tablet by mouth daily.      Marland Kitchen oxyCODONE-acetaminophen (ROXICET)  5-325 MG per tablet Take 1-2 tablets by mouth every 4 (four) hours as needed for pain.  30 tablet  0   No current facility-administered medications for this visit.    Allergies as of 09/19/2013  . (No Known Allergies)    Vitals: BP 104/72  Pulse 86  Resp 15  Ht 5' 3.5" (1.613 m)  Wt 154 lb (69.854 kg)  BMI 26.85 kg/m2 Last Weight:  Wt Readings from Last 1 Encounters:  09/19/13 154 lb (69.854 kg)   Last Height:   Ht Readings from Last 1 Encounters:  09/19/13 5' 3.5" (1.613 m)    Physical exam:  General: The patient is awake, alert and appears not in acute distress. The patient is well groomed. Head: Normocephalic, atraumatic. Neck is supple. Mallampati*3, neck circumference: 13.75 ,  Cardiovascular:  Regular rate and rhythm , without  murmurs or carotid bruit, and without distended neck veins. Respiratory: Lungs are clear to auscultation. Skin:  Without evidence of edema, or rash Trunk: normal posture.  Neurologic exam : The patient is awake and alert, oriented to place and time.  Memory subjective described as intact.  There is a normal attention span & concentration ability on medication . Speech is fluent without  dysarthria, dysphonia or aphasia. Mood and affect are appropriate.  Cranial nerves: Pupils are equal and briskly reactive to light. Funduscopic exam without  evidence of pallor or edema. Extraocular movements  in vertical and horizontal planes intact and without nystagmus. Visual fields by finger perimetry are intact. Hearing to finger rub intact.  Facial sensation intact to fine touch. Facial motor strength is symmetric and tongue and uvula move midline.  Motor exam:  Normal tone and muscle bulk and symmetric normal strength in all extremities.  Sensory:  Fine touch, pinprick and vibration were tested in all extremities. Proprioception is  normal.  Coordination: Rapid alternating movements in the fingers/hands is tested and normal. Finger-to-nose maneuver  tested and normal without evidence of ataxia, dysmetria or tremor.  Gait and station: Patient walks without assistive device . Strength within normal limits. Stance is stable and normal. Tandem gait isunfragmented. Romberg testing is normal.  Deep tendon reflexes: in the  upper and lower extremities are symmetric and intact. Babinski maneuver response is downgoing.   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 2) Fatigue .  3) hyptension  Plan:  Treatment plan and additional workup : Vyvanse refill 70 mg daily.  Need 99 years.

## 2013-09-19 NOTE — Patient Instructions (Signed)
Narcolepsy Narcolepsy is a disabling neurological disorder of sleep regulation. It affects the control of sleep. It also affects the control of wakefulness. It is an interruption of the dreaming state of sleep. This state is known as REM or rapid eye movement sleep.  SYMPTOMS  The development, number, and severity of symptoms vary widely among people with the disorder. Symptoms generally begin between the ages of 15 and 30. The four classic symptoms of the disorder are:   Excessive daytime sleepiness.  Cataplexy. This is sudden, brief episodes of muscle weakness or paralysis. It is caused by strong emotions. Common strong emotions are laughter, anger, surprise, or anticipation.  Sleep paralysis. This is paralysis upon falling asleep or waking up.  Hallucinations. These are vivid dream-like images that occur at when you first fall asleep. Other symptoms include:   Unrelenting excessive sleepiness. This is usually the first and most obvious symptom.  Sleep attacks. Patients have strong sleep attacks throughout the day. These attacks can last for 30 seconds to more than 30 minutes. These happen no matter how much or how well the person slept the night before. These attacks end up making the person sleep at work and social events. The person can fall asleep while eating, talking, and driving. They also fall asleep at other out of place times.  Disturbed nighttime sleep.  Tossing and turning in bed.  Leg jerks.  Nightmares.  Waking up often. DIAGNOSIS  It's possible that genetics play a role in this disorder. Narcolepsy is not a rare disorder. It is often misdiagnosed. It is often diagnosed years after symptoms first appear. Early diagnosis and treatment are important. This help the physical and mental well-being of the patient. TREATMENT  There is no cure for narcolepsy. The symptoms can be controlled with behavioral and medical therapy. The excessive daytime sleepiness may be treated with  stimulant drugs. It may also be treated with the drug modafinil (Provigil). Cataplexy and other REM-sleep symptoms may be treated with antidepressant medications. Medications will reduce the symptoms. Medications will not ease symptoms entirely. Many available medications have side effects. Basic lifestyle changes may also reduce the symptoms. These changes include having regular sleep schedules and scheduled daytime naps. Other lifestyle changes include avoiding "over-stimulating" situations. Document Released: 09/11/2002 Document Revised: 12/14/2011 Document Reviewed: 09/21/2005 ExitCare Patient Information 2014 [employer].  

## 2013-09-20 ENCOUNTER — Encounter (INDEPENDENT_AMBULATORY_CARE_PROVIDER_SITE_OTHER): Payer: BC Managed Care – PPO | Admitting: Ophthalmology

## 2013-09-20 DIAGNOSIS — H43819 Vitreous degeneration, unspecified eye: Secondary | ICD-10-CM

## 2013-12-18 ENCOUNTER — Ambulatory Visit (INDEPENDENT_AMBULATORY_CARE_PROVIDER_SITE_OTHER): Payer: BC Managed Care – PPO | Admitting: Sports Medicine

## 2013-12-18 ENCOUNTER — Encounter: Payer: Self-pay | Admitting: Sports Medicine

## 2013-12-18 VITALS — BP 101/72 | Ht 63.0 in | Wt 148.0 lb

## 2013-12-18 DIAGNOSIS — IMO0002 Reserved for concepts with insufficient information to code with codable children: Secondary | ICD-10-CM

## 2013-12-18 DIAGNOSIS — S76019A Strain of muscle, fascia and tendon of unspecified hip, initial encounter: Secondary | ICD-10-CM

## 2013-12-18 MED ORDER — MELOXICAM 15 MG PO TABS: 15.0000 mg | ORAL_TABLET | Freq: Every day | ORAL | Status: DC | PRN

## 2013-12-19 NOTE — Progress Notes (Signed)
   Subjective:    Patient ID: Kathleen Alvarez, female    DOB: [birth year], 48 y.o.   MRN: [id number]  HPI chief complaint: Left hip pain/left leg pain  Patient comes in today complaining of about 6 weeks of lateral left hip and leg pain. Since her last office visit, she has begun to do more cross fit training. She denies any specific injury but does remember an acute onset of discomfort that occurred while doing lunges. Since then, she has had intermittent pain along the left lateral hip. Her pain does not change with exercise. She has been using a foam roller as well as ibuprofen but despite that her pain persists. She gets some radiating pain down the upper lateral aspect of the left leg. No radiation to the knee. No groin pain. No similar injuries in the past. No numbness or tingling. No low back pain.  Interim medical history is reviewed and unchanged since last office visit Medications are reviewed    Review of Systems As above     Objective:   Physical Exam Well-developed, well-nourished. No acute distress. Awake alert and oriented x3. Vital signs are reviewed  Left hip: Smooth painless hip range of motion with a negative log roll. Tight IT band. There is tenderness to palpation at the posterior aspect of the greater trochanter at the insertion of the gluteus medius tendon. Reproducible pain with stressing of the gluteus medius muscle. No tenderness to palpation at the piriformis. No tenderness to palpation over the greater trochanteric bursa. Neurovascularly intact distally. Walking without a significant limp.       Assessment & Plan:  Lateral left hip pain likely secondary to gluteus medius tendon strain  I've asked the patient to meet with Ellamae Sia and learn a comprehensive home exercise program including hip strengthening and stretching. She may also benefit from some dry needling so I've asked her to discuss this with Jonny Ruiz as well. Meloxicam 15 mg daily as needed and  followup with me in 4 weeks. If pain persists, we will plan on an ultrasound evaluation. I think she is okay to continue with activity as tolerated using pain as her guide. I've asked her to call or followup sooner with questions or concerns.

## 2013-12-27 ENCOUNTER — Other Ambulatory Visit: Payer: Self-pay | Admitting: Neurology

## 2013-12-27 DIAGNOSIS — G47419 Narcolepsy without cataplexy: Secondary | ICD-10-CM

## 2013-12-27 MED ORDER — LISDEXAMFETAMINE DIMESYLATE 70 MG PO CAPS: 70.0000 mg | ORAL_CAPSULE | Freq: Every day | ORAL | Status: DC

## 2013-12-27 NOTE — Telephone Encounter (Signed)
Patient needs written Rx for Vyvanse--please call when ready to pickup--thank you.

## 2013-12-27 NOTE — Telephone Encounter (Signed)
Called pt and left message informing her that her Rx was ready to be picked up at the front desk and if she has any other problems, questions or concerns to call the office.  °

## 2014-01-22 ENCOUNTER — Other Ambulatory Visit: Payer: BC Managed Care – PPO | Admitting: Sports Medicine

## 2014-04-03 ENCOUNTER — Other Ambulatory Visit: Payer: Self-pay

## 2014-04-03 ENCOUNTER — Telehealth: Payer: Self-pay | Admitting: Neurology

## 2014-04-03 MED ORDER — LISDEXAMFETAMINE DIMESYLATE 70 MG PO CAPS: 70.0000 mg | ORAL_CAPSULE | Freq: Every day | ORAL | Status: DC

## 2014-04-03 NOTE — Telephone Encounter (Signed)
Called pt and left message informing her that her Rx was ready to be picked up at the front desk and if she has any other problems, questions or concerns to call the office.  °

## 2014-04-03 NOTE — Telephone Encounter (Signed)
Needs Rx for lisdexamfetamine (VYVANSE) 70 MG capsule please call for pickup

## 2014-04-04 ENCOUNTER — Other Ambulatory Visit: Payer: Self-pay

## 2014-04-04 DIAGNOSIS — Z1231 Encounter for screening mammogram for malignant neoplasm of breast: Secondary | ICD-10-CM

## 2014-04-24 ENCOUNTER — Ambulatory Visit
Admission: RE | Admit: 2014-04-24 | Discharge: 2014-04-24 | Disposition: A | Discharge status: Home / Self Care | Payer: BC Managed Care – PPO | Source: Ambulatory Visit

## 2014-04-24 ENCOUNTER — Encounter (INDEPENDENT_AMBULATORY_CARE_PROVIDER_SITE_OTHER): Payer: Self-pay

## 2014-04-24 DIAGNOSIS — Z1231 Encounter for screening mammogram for malignant neoplasm of breast: Secondary | ICD-10-CM

## 2014-06-25 ENCOUNTER — Telehealth: Payer: Self-pay | Admitting: Neurology

## 2014-06-25 NOTE — Telephone Encounter (Signed)
Patient requesting refill of Vyvanse 90 day supply, please call and advise.

## 2014-06-28 ENCOUNTER — Other Ambulatory Visit: Payer: Self-pay | Admitting: Neurology

## 2014-06-28 MED ORDER — LISDEXAMFETAMINE DIMESYLATE 70 MG PO CAPS: 70.0000 mg | ORAL_CAPSULE | Freq: Every day | ORAL | Status: DC

## 2014-06-29 NOTE — Telephone Encounter (Signed)
Called pt and left message informing her that her Rx was ready to be picked up at the front desk and if she has any other problems, questions or concerns to call the office.  °

## 2014-08-10 ENCOUNTER — Telehealth: Payer: Self-pay | Admitting: Neurology

## 2014-08-10 NOTE — Telephone Encounter (Signed)
Left message for patient regarding rescheduling 09/19/14 appointment per Dr. Vickey Huger, left message with new appointment time.

## 2014-09-19 ENCOUNTER — Ambulatory Visit (INDEPENDENT_AMBULATORY_CARE_PROVIDER_SITE_OTHER): Payer: BC Managed Care – PPO | Admitting: Neurology

## 2014-09-19 ENCOUNTER — Ambulatory Visit: Payer: BC Managed Care – PPO | Admitting: Neurology

## 2014-09-19 ENCOUNTER — Encounter: Payer: Self-pay | Admitting: Neurology

## 2014-09-19 VITALS — BP 98/65 | HR 81 | Resp 20 | Ht 62.5 in | Wt 149.5 lb

## 2014-09-19 DIAGNOSIS — G47419 Narcolepsy without cataplexy: Secondary | ICD-10-CM

## 2014-09-19 HISTORY — DX: Narcolepsy without cataplexy: G47.419

## 2014-09-19 MED ORDER — AMPHETAMINE-DEXTROAMPHETAMINE 10 MG PO TABS: 10.0000 mg | ORAL_TABLET | Freq: Every day | ORAL | Status: DC

## 2014-09-19 MED ORDER — LISDEXAMFETAMINE DIMESYLATE 70 MG PO CAPS: 70.0000 mg | ORAL_CAPSULE | Freq: Every day | ORAL | Status: DC

## 2014-09-19 NOTE — Progress Notes (Signed)
[facility]  Provider:  Melvyn Novas, M D  Referring Provider: No ref. provider found Primary Care Physician:  Lenoard Aden, MD  Chief Complaint  Patient presents with  . RV narcolepsy    Rm 10, alone    HPI:  Kathleen Alvarez is a 48 y.o. female and  seen here as a revisit for medication refill.  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 She had this condition since college years and was first seen in this practice  In 02-07-2006. For the treatment of for hypersomnia she was started on Provigil but  in 6 months she had noted some irritability and anger on that medication,  this in addition was more complicated as the patient became pregnant.  Provigil was therefore discontinued and she was started on Adderall trooper who vertebral much better for about a year she was eventually changed to Vyvanse in 2007/02/08 .  She continues to do well on Vyvanse after over 6 years. She still sleeps 10-12 hours over a 24 hour period. She is not snoring. No apnea.     Review of Systems: Out of a complete 14 system review, the patient complains of only the following symptoms, and all other reviewed systems are negative.  environmental allergies / daytime sleepiness and sleep talking.  She also underwent a breast reduction surgery in December 2013 at her last surgical procedure.   The patient social history: She is employed at  [employer],  teaching  physiology/ exercise sciences.  She is married with 2 children does not use to buckle, all or illicit drugs she drinks about one cup of coffee a day. Patient's father died in 08-Feb-2012 he had survived initial treatment for colon cancer but died of liver cancer or metastasis.  He also was a diabetic.The patient's mother has high cholesterol. Paternal uncle has sleep apnea but narcolepsy or narcolepsy like hypersomnia is not known in the family.    ROS  sleep related :  Epworth sleepiness score on Vyvanse was endorsed at 5 points, FSS 34 -  the Epworth sleepiness score without medication was endorsed at 20 points.    History   Social History  . Marital Status: Married    Spouse Name: [name]     Number of Children: 2  . 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 Smoking status: Never Smoker   . Smokeless tobacco: Never Used  . Alcohol Use: No  . Drug Use: No  . Sexual Activity: Not on file   Other Topics Concern  . Not on file   Social History Narrative   Patient is employed.    Patient married with 2 children.          History reviewed. No pertinent family history.  Past Medical History  Diagnosis Date  . Anxiety   . Narcolepsy without cataplexy 09/19/2014    Past Surgical History  Procedure Laterality Date  . Cesarean section      x2  . Lipoma     . Wisdom tooth extraction    . Colonoscopy  02-08-08    family history of colon ca  . Dilitation & currettage/hystroscopy with novasure ablation  08/26/2012    Procedure: DILATATION & CURETTAGE/HYSTEROSCOPY WITH NOVASURE ABLATION;  Surgeon: Lenoard Aden, MD;  Location: [facility];  Service: Gynecology;  Laterality: N/A;  Current Outpatient Prescriptions  Medication Sig Dispense Refill  . albuterol (PROVENTIL HFA;VENTOLIN HFA) 108 (90 BASE) MCG/ACT inhaler Inhale 2 puffs into the lungs every 6 (six) hours as needed. For exercise-induced bronchospasm    . cholecalciferol (VITAMIN D) 1000 UNITS tablet Take 1,000 Units by mouth daily.    Marland Kitchen ibuprofen (ADVIL,MOTRIN) 200 MG tablet Take 800 mg by mouth every 6 (six) hours as needed. For muscle soreness    . lisdexamfetamine (VYVANSE) 70 MG capsule Take 1 capsule (70 mg total) by mouth daily. 90 capsule 0  . Multiple Vitamin (MULTIVITAMIN WITH MINERALS) TABS Take 1 tablet by mouth daily.     No current facility-administered medications for this visit.     Allergies as of 09/19/2014 - Review Complete 09/19/2014  Allergen Reaction Noted  . Peg [polyethylene glycol] Itching and Nausea And Vomiting 09/19/2014    Vitals: BP 98/65 mmHg  Pulse 81  Resp 20  Ht 5' 2.5" (1.588 m)  Wt 149 lb 8 oz (67.813 kg)  BMI 26.89 kg/m2 Last Weight:  Wt Readings from Last 1 Encounters:  09/19/14 149 lb 8 oz (67.813 kg)   Last Height:   Ht Readings from Last 1 Encounters:  09/19/14 5' 2.5" (1.588 m)    Physical exam:  General: The patient is awake, alert and appears not in acute distress. The patient is well groomed. Head: Normocephalic, atraumatic. Neck is supple. Mallampati*3, neck circumference: 13.75 ,  Cardiovascular:  Regular rate and rhythm , without  murmurs or carotid bruit, and without distended neck veins. Respiratory: Lungs are clear to auscultation. Skin:  Without evidence of edema, or rash Trunk: normal posture.  Neurologic exam : The patient is awake and alert, oriented to place and time. Memory subjective described as intact.  There is a normal attention span & concentration ability on medication.  Speech is fluent without  dysarthria, dysphonia or aphasia. Mood and affect are appropriate.  Cranial nerves: Pupils are equal and briskly reactive to light. Funduscopic exam without  evidence of pallor or edema.  Extraocular movements in vertical and horizontal planes intact and without nystagmus. Visual fields by finger perimetry are intact. Facial sensation intact to fine touch. Facial motor strength is symmetric and tongue and uvula move midline.  Motor exam:  Normal tone and muscle bulk and symmetric normal strength in all extremities. Sensory:  Fine touch, pinprick and vibration were tested in all extremities. Proprioception is  normal. Coordination: Rapid alternating movements in the fingers/hands  Normal. Gait and station: Patient walks without assistive device . Deep tendon reflexes: in the  upper and lower extremities are  symmetric and intact. Babinski maneuver response is downgoing.  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 2) Fatigue treated with vyvanse.Marland Kitchen  3) hypotension, sporadic.   Plan:  Treatment plan and additional workup : Vyvanse refill 70 mg daily.   Need 90 days.

## 2014-09-19 NOTE — Patient Instructions (Signed)
Lisdexamfetamine Oral Capsule What is this medicine? LISDEXAMFETAMINE (lis DEX am fet a meen) is used to treat attention-deficit hyperactivity disorder (ADHD) in adults and children. It is also used to treat binge-eating disorder in adults. Federal law prohibits giving this medicine to any person other than the person for whom it was prescribed. Do not share this medicine with anyone else. This medicine may be used for other purposes; ask your health care provider or pharmacist if you have questions. COMMON BRAND NAME(S): Vyvanse What should I tell my health care provider before I take this medicine? They need to know if you have any of these conditions: -anxiety or panic attacks -circulation problems in fingers and toes -glaucoma -hardening or blockages of the arteries or heart blood vessels -heart disease or a heart defect -high blood pressure -history of a drug or alcohol abuse problem -history of stroke -kidney disease -liver disease -mental illness -seizures -suicidal thoughts, plans, or attempt; a previous suicide attempt by you or a family member -thyroid disease -Tourette's syndrome -an unusual or allergic reaction to lisdexamfetamine, other medicines, foods, dyes, or preservatives -pregnant or trying to get pregnant -breast-feeding How should I use this medicine? Take this medicine by mouth. Follow the directions on the prescription label. Swallow the capsules with a drink of water. You may open capsule and add to a glass of water, then drink right away. Take your doses at regular intervals. Do not take your medicine more often than directed. Do not suddenly stop your medicine. You must gradually reduce the dose or you may feel withdrawal effects. Ask your doctor or health care professional for advice. A special MedGuide will be given to you by the pharmacist with each prescription and refill. Be sure to read this information carefully each time. Talk to your pediatrician  regarding the use of this medicine in children. While this drug may be prescribed for children as young as 8 years of age for selected conditions, precautions do apply. Overdosage: If you think you have taken too much of this medicine contact a poison control center or emergency room at once. NOTE: This medicine is only for you. Do not share this medicine with others. What if I miss a dose? If you miss a dose, take it as soon as you can. If it is almost time for your next dose, take only that dose. Do not take double or extra doses. What may interact with this medicine? Do not take this medicine with any of the following medications: -certain medicines for migraine headache like almotriptan, eletriptan, frovatriptan, naratriptan, rizatriptan, sumatriptan, zolmitriptan -MAOIs like Carbex, Eldepryl, Marplan, Nardil, and Parnate -meperidine -other stimulant medicines for attention disorders, weight loss, or to stay awake -pimozide -procarbazine This medicine may also interact with the following medications: -acetazolamide -ammonium chloride -antacids -ascorbic acid -atomoxetine -caffeine -certain medicines for blood pressure -certain medicines for depression, anxiety, or psychotic disturbances -certain medicines for seizures like carbamazepine, phenobarbital, phenytoin -certain medicines for stomach problems like cimetidine, famotidine, omeprazole, lansoprazole -cold or allergy medicines -green tea -levodopa -linezolid -medicines for sleep during surgery -methenamine -norepinephrine -phenothiazines like chlorpromazine, mesoridazine, prochlorperazine, thioridazine -propoxyphene -sodium acid phosphate -sodium bicarbonate This list may not describe all possible interactions. Give your health care provider a list of all the medicines, herbs, non-prescription drugs, or dietary supplements you use. Also tell them if you smoke, drink alcohol, or use illegal drugs. Some items may interact  with your medicine. What should I watch for while using this medicine? Visit your doctor for  regular check ups. This prescription requires that you follow special procedures with your doctor and pharmacy. You will need to have a new written prescription from your doctor every time you need a refill. This medicine may affect your concentration, or hide signs of tiredness. Until you know how this medicine affects you, do not drive, ride a bicycle, use machinery, or do anything that needs mental alertness. Tell your doctor or health care professional if this medicine loses its effects, or if you feel you need to take more than the prescribed amount. Do not change your dose without talking to your doctor or health care professional. Decreased appetite is a common side effect when starting this medicine. Eating small, frequent meals or snacks can help. Talk to your doctor if you continue to have poor eating habits. Height and weight growth of a child taking this medicine will be monitored closely. Do not take this medicine close to bedtime. It may prevent you from sleeping. If you are going to need surgery, a MRI, CT scan, or other procedure, tell your doctor that you are taking this medicine. You may need to stop taking this medicine before the procedure. Tell your doctor or healthcare professional right away if you notice unexplained wounds on your fingers and toes while taking this medicine. You should also tell your healthcare provider if you experience numbness or pain, changes in the skin color, or sensitivity to temperature in your fingers or toes. What side effects may I notice from receiving this medicine? Side effects that you should report to your doctor or health care professional as soon as possible: -allergic reactions like skin rash, itching or hives, swelling of the face, lips, or tongue -changes in vision -chest pain or chest tightness -confusion, trouble speaking or understanding -fast,  irregular heartbeat -fingers or toes feel numb, cool, painful -hallucination, loss of contact with reality -high blood pressure -males: prolonged or painful erection -seizures -severe headaches -shortness of breath -suicidal thoughts or other mood changes -trouble walking, dizziness, loss of balance or coordination -uncontrollable head, mouth, neck, arm, or leg movements Side effects that usually do not require medical attention (report to your doctor or health care professional if they continue or are bothersome): -anxious -headache -loss of appetite -nausea, vomiting -trouble sleeping -weight loss This list may not describe all possible side effects. Call your doctor for medical advice about side effects. You may report side effects to FDA at 1-800-FDA-1088. Where should I keep my medicine? Keep out of the reach of children. This medicine can be abused. Keep your medicine in a safe place to protect it from theft. Do not share this medicine with anyone. Selling or giving away this medicine is dangerous and against the law. Store at room temperature between 15 and 30 degrees C (59 and 86 degrees F). Protect from light. Keep container tightly closed. Throw away any unused medicine after the expiration date. NOTE: This sheet is a summary. It may not cover all possible information. If you have questions about this medicine, talk to your doctor, pharmacist, or health care provider.  2015, Elsevier/Gold Standard. (2013-11-09 15:41:08)

## 2014-10-01 ENCOUNTER — Telehealth: Payer: Self-pay | Admitting: Neurology

## 2014-10-01 NOTE — Telephone Encounter (Signed)
Patient stated Pharmacy needs prior authorization for Rx lisdexamfetamine (VYVANSE) 70 MG capsule.  Patient stated last dosage will be tomorrow, questioning if we have samples.  Please call and advise.

## 2014-10-01 NOTE — Telephone Encounter (Signed)
I have contacted ins and provided all clinical info required.  Request is currently under review.  Unfortunately, CII narcotics are not sampled.  I called the patient back, got no answer.  Left message.

## 2014-10-02 ENCOUNTER — Telehealth: Payer: Self-pay

## 2014-10-02 DIAGNOSIS — G47419 Narcolepsy without cataplexy: Secondary | ICD-10-CM

## 2014-10-02 DIAGNOSIS — F900 Attention-deficit hyperactivity disorder, predominantly inattentive type: Secondary | ICD-10-CM | POA: Insufficient documentation

## 2014-10-02 MED ORDER — LISDEXAMFETAMINE DIMESYLATE 70 MG PO CAPS: 70.0000 mg | ORAL_CAPSULE | Freq: Every day | ORAL | Status: DC

## 2014-10-02 NOTE — Telephone Encounter (Signed)
I called the patient back and explained ins has denied the request for coverage on Vyvanse.  They required a confirmed documented diagnosis of ADD or ADHD.  Message forwarded to provider for review.  Patient is aware.

## 2014-10-02 NOTE — Telephone Encounter (Signed)
Pt is calling back stating she needs to know what's going on with her medication. She is completely out of Vyvanse. She took the last one today. Please call and advise ASAP.

## 2014-10-02 NOTE — Telephone Encounter (Signed)
I contacted ins.  They asked that we fax additional clinical info regarding this to the Appeals Dept at [phone].  I have faxed all info they requested.  As well, I called the patient back.  She is aware.

## 2014-10-02 NOTE — Telephone Encounter (Signed)
Insurance contacted [place] to advise they have denied our request for coverage on Vyvanse.  Their current formulary does not allow coverage of this medication without a confirmed documented diagnosis of either ADD or ADHD.  They will not cover any other drug in this class without the same criteria being met.  Ref # [id number]  Please advise.  Thank you.

## 2014-10-02 NOTE — Telephone Encounter (Signed)
This patient uses it for both ADD and narcolepsy.

## 2014-10-03 ENCOUNTER — Telehealth: Payer: Self-pay

## 2014-10-03 NOTE — Telephone Encounter (Signed)
May take 10 mg adderall  in AM and 10 mg at lunch time until Vyvanse is filled. PO

## 2014-10-03 NOTE — Telephone Encounter (Signed)
Additional info was faxed to ins regarding Vyvanse yesterday evening.  Patient called back and would like to know if she could take additional tabs of Adderall 10mg  to help her until Vyvanse redetermination has been made by ins.  If so, she would like to know how many she should take.  Please advise.  Thank you.

## 2014-10-03 NOTE — Telephone Encounter (Signed)
I called the patient back.  Relayed Dr Dohmeier's message.  She verbalized understanding, and was agreeable to this plan.  Will call us back if anything further is needed.

## 2014-10-03 NOTE — Telephone Encounter (Signed)
I called the patient to let her know that her Rx for Vyvanse was ready for pick up, but the patient stated that she didn't need another Rx and that she already got her Rx on 09/19/14.  This Rx was placed in the shred ben after it was voided.

## 2014-10-08 ENCOUNTER — Telehealth: Payer: Self-pay | Admitting: Neurology

## 2014-10-08 NOTE — Telephone Encounter (Signed)
I called the patient back.  She is aware all current clinical info has been faxed.

## 2014-10-08 NOTE — Telephone Encounter (Signed)
All required info has been submitted to ins.  Request is currently under review.  I called back, got no answer.  Left message.

## 2014-10-08 NOTE — Telephone Encounter (Signed)
Pt is calling stating her amphetamine-dextroamphetamine (ADDERALL) 10 MG tablet needs prior approval before it can be filled.  Please call and advise.

## 2014-10-08 NOTE — Telephone Encounter (Signed)
Pt called back and wants Shanda Bumps to call back she wants to make sure you are sending the correct information to the insurance company. Please call and advise.

## 2014-10-09 ENCOUNTER — Telehealth: Payer: Self-pay

## 2014-10-09 NOTE — Telephone Encounter (Signed)
[employer] Plan notified us they have approved our request for coverage on Adderall effective until 10/08/2015 Ref # [id number]

## 2014-10-10 ENCOUNTER — Telehealth: Payer: Self-pay

## 2014-10-10 NOTE — Telephone Encounter (Signed)
[employer] [facility] Plan notified us, as well as the patient, that they have decided to approve our request for coverage on Vyvanse effective until 10/10/2015 Ref # [id number]

## 2014-11-05 ENCOUNTER — Telehealth: Payer: Self-pay | Admitting: *Deleted

## 2014-11-05 DIAGNOSIS — G47419 Narcolepsy without cataplexy: Secondary | ICD-10-CM

## 2014-11-05 DIAGNOSIS — F900 Attention-deficit hyperactivity disorder, predominantly inattentive type: Secondary | ICD-10-CM

## 2014-11-05 MED ORDER — LISDEXAMFETAMINE DIMESYLATE 70 MG PO CAPS: 70.0000 mg | ORAL_CAPSULE | Freq: Every day | ORAL | Status: DC

## 2014-11-05 NOTE — Telephone Encounter (Signed)
Request entered, forwarded to provider for approval.  

## 2014-11-06 ENCOUNTER — Telehealth: Payer: Self-pay

## 2014-11-06 NOTE — Telephone Encounter (Signed)
Called patient to inform Rx ready for pick up at front desk. No answer. Left vmail. 

## 2014-11-06 NOTE — Telephone Encounter (Signed)
A new Rx for #30 was written so patient can use discount card.  Kathleen Alvarez already called the patient regarding this Rx, but had to leave her a message.  Please see other note.

## 2014-11-06 NOTE — Telephone Encounter (Signed)
Patient stated she could only get discount for 30 day supply for Rx lisdexamfetamine (VYVANSE) 70 MG capsule.  Please call and advise.

## 2014-11-19 ENCOUNTER — Ambulatory Visit: Payer: BC Managed Care – PPO | Admitting: Sports Medicine

## 2014-11-21 ENCOUNTER — Ambulatory Visit (INDEPENDENT_AMBULATORY_CARE_PROVIDER_SITE_OTHER): Payer: BC Managed Care – PPO | Admitting: Sports Medicine

## 2014-11-21 ENCOUNTER — Encounter: Payer: Self-pay | Admitting: Sports Medicine

## 2014-11-21 VITALS — BP 108/74 | HR 99 | Ht 63.0 in | Wt 148.0 lb

## 2014-11-21 DIAGNOSIS — M25561 Pain in right knee: Secondary | ICD-10-CM

## 2014-11-21 NOTE — Progress Notes (Signed)
   Subjective:    Patient ID: Kathleen Alvarez, female    DOB: Oct 08, 1965, 49 y.o.   MRN: [id number]  HPI chief complaint: Right calf pain  Patient comes in today complaining of 3 months of right calf pain. She initially injured the calf while exercising. This was around Thanksgiving. Symptoms did improve with the use of a compression sleeve but she reinjured herself 5 weeks later. Since then she has noticed persistent discomfort in the proximal calf particularly with sprinting. She has been able to run at a slower pace without too much difficulty. She did notice some bruising initially with her injury. That has resolved. Pain at times will radiate into the proximal medial gastroc. Although she has been able to run at a slower pace she does note returning discomfort while running up hill. She has had calf strains in the past. She has also had problems with her Achilles tendons in the past. No associated numbness or tingling. Interval medical history reviewed Medications reviewed Allergies reviewed    Review of Systems     Objective:   Physical Exam Fit appearing. No acute distress.  Right calf: Patient is tender to palpation in the midline of the proximal calf just below the popliteal fossa. No palpable defect. No bruising. No pain with resisted knee flexion. Achilles tendon is intact distally. Tight Achilles bilaterally. Neurovascular intact distally. Walking without a limp.  MSK ultrasound of the right calf was performed. There is an area of hypoechoic change in the proximal soleus which is likely consistent with a partial tear here. Gastrocnemius appears to be within normal limits.       Assessment & Plan:  Right calf pain likely secondary to proximal soleus strain  This is an unusual injury. I will try a body helix knee compression sleeve and she can choose between this or her calf sleeve which one she wants to wear with activity. 5/16 inch heel lifts on green sports insoles for her cross  fit shoes. She will start daily Alfredson heel drop eccentric exercises with the knee both straight and bent. She is okay to continue running and CrossFit training but will avoid sprinting. Follow-up with me in 4 weeks. Repeat ultrasound at that time. We discussed the possibility of nitroglycerin patches if symptoms persist.

## 2014-12-03 ENCOUNTER — Telehealth: Payer: Self-pay | Admitting: Neurology

## 2014-12-03 NOTE — Telephone Encounter (Signed)
Patient requesting 30 day supply refill for Rx lisdexamfetamine (VYVANSE) 70 MG capsule. Please call and advise.

## 2014-12-04 ENCOUNTER — Telehealth: Payer: Self-pay | Admitting: *Deleted

## 2014-12-04 DIAGNOSIS — F900 Attention-deficit hyperactivity disorder, predominantly inattentive type: Secondary | ICD-10-CM

## 2014-12-04 DIAGNOSIS — G47419 Narcolepsy without cataplexy: Secondary | ICD-10-CM

## 2014-12-04 MED ORDER — LISDEXAMFETAMINE DIMESYLATE 70 MG PO CAPS: 70.0000 mg | ORAL_CAPSULE | Freq: Every day | ORAL | Status: DC

## 2014-12-04 NOTE — Telephone Encounter (Signed)
Refill for vyvanse 70mg  po daily # 30, no refill printed for signature.

## 2014-12-04 NOTE — Telephone Encounter (Signed)
Pt calling for refill

## 2014-12-05 MED ORDER — LISDEXAMFETAMINE DIMESYLATE 70 MG PO CAPS: 70.0000 mg | ORAL_CAPSULE | Freq: Every day | ORAL | Status: DC

## 2014-12-06 ENCOUNTER — Telehealth: Payer: Self-pay

## 2014-12-06 NOTE — Telephone Encounter (Signed)
Refilled,  Prescription signed.  Placed in pharm tech room.

## 2014-12-06 NOTE — Telephone Encounter (Signed)
Called patient and informed Rx ready for pick up at front desk. Patient verbalized understanding.  

## 2014-12-19 ENCOUNTER — Encounter: Payer: Self-pay | Admitting: Sports Medicine

## 2014-12-19 ENCOUNTER — Ambulatory Visit (INDEPENDENT_AMBULATORY_CARE_PROVIDER_SITE_OTHER): Payer: BC Managed Care – PPO | Admitting: Sports Medicine

## 2014-12-19 VITALS — BP 100/70 | Ht 63.0 in | Wt 150.0 lb

## 2014-12-19 DIAGNOSIS — S86811D Strain of other muscle(s) and tendon(s) at lower leg level, right leg, subsequent encounter: Secondary | ICD-10-CM | POA: Diagnosis not present

## 2014-12-19 MED ORDER — NITROGLYCERIN 0.2 MG/HR TD PT24: MEDICATED_PATCH | TRANSDERMAL | Status: DC

## 2014-12-19 NOTE — Patient Instructions (Signed)

## 2014-12-20 NOTE — Progress Notes (Signed)
   Subjective:    Patient ID: Kathleen Alvarez, female    DOB: July 08, 1966, 49 y.o.   MRN: [id number]  HPI   Patient comes in today for follow-up on her right calf strain. Unfortunately she reinjured herself this past weekend. While playing basketball she felt a pull in the mid substance of the right calf. Her pain is very localized to this area. Her previous pain was a little more proximal near the popliteal fossa. She denies any swelling or bruising. She has been doing her home exercises but has not been doing them consistently. She has also been wearing her heel lifts but admits that she was not wearing them when she injured herself playing basketball.    Review of Systems     Objective:   Physical Exam Well-developed, fit appearing. No acute distress  Examination of the right calf shows palpable tenderness in the mid substance of the medial head of the gastrocnemius. No palpable defect. No soft tissue swelling. No ecchymosis. She is no longer tender to palpation more proximally in the popliteal fossa. Achilles tendon is intact. Neurovascularly intact distally. Walking with a slight limp.  MSK ultrasound of the right calf was performed. There is a small hypoechoic area at the interface of the gastrocnemius and the soleus directly over the area of tenderness. There is also some hyperechoic change surrounding this area. Findings are consistent with a small tear of the gastrocnemius and evidence of scar tissue from prior injury. The hypoechoic area seen in the soleus on her previous ultrasound is not seen today.       Assessment & Plan:  Right calf pain secondary to calf strain  Up until now the patient has been using her knee sleeve. Her pain is now localized to the gastrocnemius so I will have her use her calf sleeve instead. We will also start a nitroglycerin protocol and she will continue with her home exercises. Continue with her heel lifts. She may resume activity as tolerated with the  exception of sprinting and jumping. Follow-up in 4 weeks for reevaluation and repeat ultrasound. I explained to her that these types of injuries can take up to 3 months before resolving.

## 2014-12-31 ENCOUNTER — Telehealth: Payer: Self-pay

## 2014-12-31 ENCOUNTER — Other Ambulatory Visit: Payer: Self-pay | Admitting: Neurology

## 2014-12-31 DIAGNOSIS — F900 Attention-deficit hyperactivity disorder, predominantly inattentive type: Secondary | ICD-10-CM

## 2014-12-31 DIAGNOSIS — G47419 Narcolepsy without cataplexy: Secondary | ICD-10-CM

## 2014-12-31 MED ORDER — LISDEXAMFETAMINE DIMESYLATE 70 MG PO CAPS: 70.0000 mg | ORAL_CAPSULE | Freq: Every day | ORAL | Status: DC

## 2014-12-31 NOTE — Telephone Encounter (Signed)
Last written on 03/02.  Forwarded to provider for review.

## 2014-12-31 NOTE — Telephone Encounter (Signed)
Left message that Rx is ready to pick up.

## 2014-12-31 NOTE — Telephone Encounter (Signed)
Pt is calling requesting a written Rx for lisdexamfetamine (VYVANSE) 70 MG capsule. Please call when ready for pick up.

## 2015-01-17 ENCOUNTER — Ambulatory Visit (HOSPITAL_COMMUNITY)
Admission: RE | Admit: 2015-01-17 | Discharge: 2015-01-17 | Disposition: A | Discharge status: Home / Self Care | Payer: BC Managed Care – PPO | Source: Ambulatory Visit | Attending: Rheumatology | Admitting: Rheumatology

## 2015-01-17 ENCOUNTER — Other Ambulatory Visit (HOSPITAL_COMMUNITY): Payer: Self-pay | Admitting: Rheumatology

## 2015-01-17 DIAGNOSIS — Z5189 Encounter for other specified aftercare: Secondary | ICD-10-CM

## 2015-01-17 DIAGNOSIS — D899 Disorder involving the immune mechanism, unspecified: Secondary | ICD-10-CM | POA: Insufficient documentation

## 2015-01-17 DIAGNOSIS — M069 Rheumatoid arthritis, unspecified: Secondary | ICD-10-CM | POA: Diagnosis not present

## 2015-01-31 ENCOUNTER — Other Ambulatory Visit: Payer: Self-pay | Admitting: Neurology

## 2015-01-31 ENCOUNTER — Telehealth: Payer: Self-pay

## 2015-01-31 DIAGNOSIS — F900 Attention-deficit hyperactivity disorder, predominantly inattentive type: Secondary | ICD-10-CM

## 2015-01-31 DIAGNOSIS — G47419 Narcolepsy without cataplexy: Secondary | ICD-10-CM

## 2015-01-31 MED ORDER — LISDEXAMFETAMINE DIMESYLATE 70 MG PO CAPS: 70.0000 mg | ORAL_CAPSULE | Freq: Every day | ORAL | Status: DC

## 2015-01-31 NOTE — Telephone Encounter (Signed)
Patient is calling to get a written Rx for lisdexamfetamine (VYVANSE) 70 MG capsule. Please call patient when ready for pickup. Thank you.

## 2015-01-31 NOTE — Telephone Encounter (Signed)
Request entered, forwarded to provider for approval.  

## 2015-01-31 NOTE — Telephone Encounter (Signed)
Called pt to inform her that Rx was available for pick up. Left message. Asked her to bring a photo ID and gave her the clinic hours.

## 2015-03-01 ENCOUNTER — Other Ambulatory Visit: Payer: Self-pay | Admitting: Neurology

## 2015-03-01 ENCOUNTER — Telehealth: Payer: Self-pay

## 2015-03-01 DIAGNOSIS — F900 Attention-deficit hyperactivity disorder, predominantly inattentive type: Secondary | ICD-10-CM

## 2015-03-01 DIAGNOSIS — G47419 Narcolepsy without cataplexy: Secondary | ICD-10-CM

## 2015-03-01 MED ORDER — LISDEXAMFETAMINE DIMESYLATE 70 MG PO CAPS: 70.0000 mg | ORAL_CAPSULE | Freq: Every day | ORAL | Status: DC

## 2015-03-01 NOTE — Telephone Encounter (Signed)
Request entered, forwarded to provider for approval.  

## 2015-03-01 NOTE — Telephone Encounter (Signed)
Called pt to inform her that her RX is ready for pick up at the front desk. Left a message on mobile per DPR giving her this information and clinic hours.

## 2015-03-01 NOTE — Telephone Encounter (Signed)
Patient called requesting a refill for lisdexamfetamine (VYVANSE) 70 MG capsule. Patient was advised that the script will be ready for pick up in 24 hours. Patient was also advised that Monday is [facility] and the office will be closed so it will be until Tuesday until she can pick up her script unless she hears otherwise by the nurse. Patient can be reached @ [phone]

## 2015-03-29 ENCOUNTER — Other Ambulatory Visit: Payer: Self-pay | Admitting: Neurology

## 2015-03-29 DIAGNOSIS — G47419 Narcolepsy without cataplexy: Secondary | ICD-10-CM

## 2015-03-29 DIAGNOSIS — F900 Attention-deficit hyperactivity disorder, predominantly inattentive type: Secondary | ICD-10-CM

## 2015-03-29 MED ORDER — LISDEXAMFETAMINE DIMESYLATE 70 MG PO CAPS: 70.0000 mg | ORAL_CAPSULE | Freq: Every day | ORAL | Status: DC

## 2015-03-29 NOTE — Telephone Encounter (Signed)
Request entered, forwarded to provider for approval.  

## 2015-03-29 NOTE — Telephone Encounter (Signed)
Patient called and requested refill on Rx. lisdexamfetamine (VYVANSE) 70 MG capsule. Please call and advise.

## 2015-04-01 ENCOUNTER — Telehealth: Payer: Self-pay | Admitting: Neurology

## 2015-04-01 ENCOUNTER — Other Ambulatory Visit: Payer: Self-pay | Admitting: *Deleted

## 2015-04-01 DIAGNOSIS — G47419 Narcolepsy without cataplexy: Secondary | ICD-10-CM

## 2015-04-01 DIAGNOSIS — F900 Attention-deficit hyperactivity disorder, predominantly inattentive type: Secondary | ICD-10-CM

## 2015-04-01 MED ORDER — LISDEXAMFETAMINE DIMESYLATE 70 MG PO CAPS: 70.0000 mg | ORAL_CAPSULE | Freq: Every day | ORAL | Status: DC

## 2015-04-01 NOTE — Telephone Encounter (Signed)
Dr. Dohmeier printed rx. on 03-29-15, but didn't sign it, and she is not in the office today, so rx. reprinted under Dr. Sater's name, as he is the w/i doc today/fim 

## 2015-04-01 NOTE — Telephone Encounter (Signed)
I called back.  Patient is asking for meds 2 days early because she is going out of town.  Pharmacy wanted to let us know.

## 2015-04-01 NOTE — Telephone Encounter (Signed)
Called and left message (per DPR) that pt has her RX ready for pick up at the front desk.

## 2015-04-01 NOTE — Telephone Encounter (Signed)
Kathleen Alvarez from [facility] pharmacy called wanting to get an approval on getting lisdexamfetamine (VYVANSE) 70 MG capsule refilled early. Patient is going out of town Please call and advise. Kathleen Alvarez can be reached @ [phone]

## 2015-05-03 ENCOUNTER — Other Ambulatory Visit: Payer: Self-pay | Admitting: Neurology

## 2015-05-03 DIAGNOSIS — F900 Attention-deficit hyperactivity disorder, predominantly inattentive type: Secondary | ICD-10-CM

## 2015-05-03 DIAGNOSIS — G47419 Narcolepsy without cataplexy: Secondary | ICD-10-CM

## 2015-05-03 NOTE — Telephone Encounter (Signed)
Rx entered, forwarded to provider for approval.  

## 2015-05-03 NOTE — Telephone Encounter (Signed)
Patient is calling to get a written Rx for lisdexamfetamine (VYVANSE) 70 MG capsule. I advised the Rx will be ready in 24 hours(Monday) unless otherwise advised by the nurse. Thank you.

## 2015-05-06 ENCOUNTER — Telehealth: Payer: Self-pay

## 2015-05-06 ENCOUNTER — Other Ambulatory Visit: Payer: Self-pay

## 2015-05-06 MED ORDER — LISDEXAMFETAMINE DIMESYLATE 70 MG PO CAPS: 70.0000 mg | ORAL_CAPSULE | Freq: Every day | ORAL | Status: DC

## 2015-05-06 NOTE — Progress Notes (Signed)
Pt's RX is ready for pick up at the front desk. 

## 2015-05-06 NOTE — Telephone Encounter (Signed)
Pt's RX is available for pick up at the front desk. 

## 2015-05-15 ENCOUNTER — Ambulatory Visit (INDEPENDENT_AMBULATORY_CARE_PROVIDER_SITE_OTHER): Payer: BC Managed Care – PPO | Admitting: Sports Medicine

## 2015-05-15 ENCOUNTER — Encounter: Payer: Self-pay | Admitting: Sports Medicine

## 2015-05-15 VITALS — BP 100/50 | HR 106 | Ht 63.0 in | Wt 148.0 lb

## 2015-05-15 DIAGNOSIS — M25562 Pain in left knee: Secondary | ICD-10-CM | POA: Diagnosis not present

## 2015-05-16 NOTE — Progress Notes (Signed)
   Subjective:    Patient ID: Kathleen Alvarez, female    DOB: [birth year], 49 y.o.   MRN: [id number]  HPI chief complaint: Left knee pain  Patient comes in today complaining of 3 months of left knee pain. She injured the knee while lifting weights. While lifting the barbell up from the floor she accidentally caught her weight lifting belt which resulted in a twisting type injury to the left knee. Her pain initially felt like she had "hit my funny bone". That pain eventually resolved but she has been left with intermittent stiffness in the knee particularly if she sits with the knee bent for a long period of time. She denies any swelling in the knee at the time of the injury or since. She feels like most of her discomfort is along the lateral knee. It has not interfered with her ability to continue to be active. She is very fit and enjoys running and weight lifting. She denies instability in the knee. Other than the initial feeling of neuropathic pain she denies any recurring numbness, tingling, or weakness into the left leg or foot. Her pain has not bothered her enough to take any sort of pain medication. She denies significant problems with this knee in the past. No prior left knee surgery.    Review of Systems     Objective:   Physical Exam Well-developed, fit appearing. No acute distress. Awake alert and oriented 3. Vital signs reviewed.  Left knee: Full range of motion. No effusion. Good strength. She has some slight tenderness to palpation along the distal IT band at the lateral femoral condyle. Slight tenderness over the lateral joint line as well but a negative McMurray's and a negative Thessaly's. There is no tenderness to palpation along the medial joint line. Knee is stable to valgus and varus stressing. Negative anterior drawer, negative posterior drawer. No popliteal cyst. Negative Tinel's over the fibular head. No tenderness to palpation over the proximal fibula. No atrophy. Good strength  distally. Walking without a limp.  MSK ultrasound: A very brief limited ultrasound of the lateral knee was performed. Visualized portion of the distal IT band appears to be unremarkable. Visualized portion of the lateral meniscus also appears to be unremarkable. There is no joint effusion.       Assessment & Plan:  Left knee pain  I'm not quite sure of the exact etiology of her pain. It is certainly not interfering with her ability to the active. I've reassured her that I do not see any significant abnormalities on her ultrasound and I think she can continue with activity as tolerated. She may have a very mild amount of early arthritis that she aggravated with her injury but I've recommended a period of watchful waiting. Hopefully this will resolve over the next several weeks. If not, or if symptoms worsen, she will return to the office for reevaluation and I'll consider further diagnostic imaging. I recommended that she wear a compression wrap on her left knee with weight lifting. Follow-up if symptoms persist or worsen.  Of note, she was also complaining of some returning right foot arch pain. She has had this before. It usually resolves with a change of shoes. I did go ahead and give her an arch strap to try. She will let me know if symptoms persist here as well.

## 2015-06-03 ENCOUNTER — Telehealth: Payer: Self-pay

## 2015-06-03 ENCOUNTER — Other Ambulatory Visit: Payer: Self-pay | Admitting: Neurology

## 2015-06-03 DIAGNOSIS — F900 Attention-deficit hyperactivity disorder, predominantly inattentive type: Secondary | ICD-10-CM

## 2015-06-03 DIAGNOSIS — G47419 Narcolepsy without cataplexy: Secondary | ICD-10-CM

## 2015-06-03 MED ORDER — LISDEXAMFETAMINE DIMESYLATE 70 MG PO CAPS: 70.0000 mg | ORAL_CAPSULE | Freq: Every day | ORAL | Status: DC

## 2015-06-03 NOTE — Telephone Encounter (Signed)
Left message on cell (per DPR) that pt's RX is available for pick up at at the front desk.

## 2015-06-03 NOTE — Telephone Encounter (Signed)
Pt needs refill on lisdexamfetamine (VYVANSE) 70 MG capsule, May call pt at [phone]

## 2015-06-03 NOTE — Telephone Encounter (Signed)
Request entered, forwarded to provider for approval.   (This is a patient of Dr Vickey Huger, not Dr Epimenio Foot.)

## 2015-07-01 ENCOUNTER — Telehealth: Payer: Self-pay | Admitting: Neurology

## 2015-07-01 ENCOUNTER — Other Ambulatory Visit: Payer: Self-pay

## 2015-07-01 DIAGNOSIS — F900 Attention-deficit hyperactivity disorder, predominantly inattentive type: Secondary | ICD-10-CM

## 2015-07-01 DIAGNOSIS — G47419 Narcolepsy without cataplexy: Secondary | ICD-10-CM

## 2015-07-01 NOTE — Telephone Encounter (Signed)
Patient is calling to order a written Rx lisdexamfetamine 70 mg. Thanks!

## 2015-07-02 MED ORDER — LISDEXAMFETAMINE DIMESYLATE 70 MG PO CAPS: 70.0000 mg | ORAL_CAPSULE | Freq: Every day | ORAL | Status: DC

## 2015-07-02 NOTE — Telephone Encounter (Signed)
Left message on cell phone VM per DPR, informing her that her RX is ready for pick up at the front desk.

## 2015-07-31 ENCOUNTER — Other Ambulatory Visit: Payer: Self-pay | Admitting: Neurology

## 2015-07-31 ENCOUNTER — Telehealth: Payer: Self-pay

## 2015-07-31 DIAGNOSIS — F900 Attention-deficit hyperactivity disorder, predominantly inattentive type: Secondary | ICD-10-CM

## 2015-07-31 DIAGNOSIS — G47419 Narcolepsy without cataplexy: Secondary | ICD-10-CM

## 2015-07-31 MED ORDER — LISDEXAMFETAMINE DIMESYLATE 70 MG PO CAPS: 70.0000 mg | ORAL_CAPSULE | Freq: Every day | ORAL | Status: DC

## 2015-07-31 NOTE — Telephone Encounter (Signed)
Request entered, forwarded to provider for approval   Patient has appt scheduled in Dec

## 2015-07-31 NOTE — Telephone Encounter (Signed)
Patient is calling to get a written Rx for lisdexamfetamine (VYVANSE) 70 MG capsule. I advised the Rx will be ready in 24 hours unless otherwise advised by the nurse. Thank you.

## 2015-07-31 NOTE — Telephone Encounter (Signed)
Left a message (per DPR) that her RX is ready for pick up at the front desk.

## 2015-08-27 ENCOUNTER — Other Ambulatory Visit: Payer: Self-pay

## 2015-08-27 ENCOUNTER — Telehealth: Payer: Self-pay | Admitting: Neurology

## 2015-08-27 ENCOUNTER — Telehealth: Payer: Self-pay

## 2015-08-27 DIAGNOSIS — F900 Attention-deficit hyperactivity disorder, predominantly inattentive type: Secondary | ICD-10-CM

## 2015-08-27 DIAGNOSIS — G47419 Narcolepsy without cataplexy: Secondary | ICD-10-CM

## 2015-08-27 MED ORDER — LISDEXAMFETAMINE DIMESYLATE 70 MG PO CAPS: 70.0000 mg | ORAL_CAPSULE | Freq: Every day | ORAL | Status: DC

## 2015-08-27 NOTE — Telephone Encounter (Signed)
Has annual appt scheduled.

## 2015-08-27 NOTE — Telephone Encounter (Signed)
Patient is calling for a written Rx lisdexamfetamine(Vyvanse) 70 mg.  Thanks!

## 2015-08-27 NOTE — Telephone Encounter (Signed)
This is not a patient of Dr Epimenio Foot.  Patient sees Dr Vickey Huger.  Request has been entered and forwarded to her for approval.

## 2015-08-27 NOTE — Telephone Encounter (Signed)
Left a message on cell phone number (per DPR) advising pt that her RX is ready for pick up at the front desk.

## 2015-09-18 ENCOUNTER — Ambulatory Visit (INDEPENDENT_AMBULATORY_CARE_PROVIDER_SITE_OTHER): Payer: BC Managed Care – PPO | Admitting: Neurology

## 2015-09-18 ENCOUNTER — Encounter: Payer: Self-pay | Admitting: Neurology

## 2015-09-18 VITALS — BP 98/82 | HR 88 | Resp 20 | Ht 63.0 in | Wt 139.0 lb

## 2015-09-18 DIAGNOSIS — G47419 Narcolepsy without cataplexy: Secondary | ICD-10-CM

## 2015-09-18 DIAGNOSIS — F900 Attention-deficit hyperactivity disorder, predominantly inattentive type: Secondary | ICD-10-CM

## 2015-09-18 MED ORDER — AMPHETAMINE-DEXTROAMPHETAMINE 10 MG PO TABS: 10.0000 mg | ORAL_TABLET | Freq: Every day | ORAL | Status: DC

## 2015-09-18 MED ORDER — LISDEXAMFETAMINE DIMESYLATE 70 MG PO CAPS: 70.0000 mg | ORAL_CAPSULE | Freq: Every day | ORAL | Status: DC

## 2015-09-18 NOTE — Progress Notes (Signed)
[facility]  Provider:  Melvyn Novas, M D  Referring Provider: [name], MD Primary Care Physician:  Lenoard Aden, MD  Chief Complaint  Patient presents with  . Follow-up    narcolepsy with cataplexy, med refill visit, rm 11, alone    HPI:  Kathleen Alvarez is a 49 y.o. female and  seen here as a revisit for medication refill.    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  She had this condition since college years and was first seen in this practice  In 01-25-06. For the treatment of for hypersomnia she was started on Provigil but  in 6 months she had noted some irritability and anger on that medication,  this in addition was more complicated as the patient became pregnant.  Provigil was therefore discontinued and she was started on Adderall trooper who vertebral much better for about a year she was eventually changed to Vyvanse in 2007/01/26 . In 04/23/2016she was diagnosed with rheumatoid arthritis, takes MTX. this condition deepens her fatigue further.   She continues to do well on Vyvanse after over 7 years. She still sleeps 10-12 hours over a 24 hour period. She is not snoring. No apnea.     Review of Systems: Out of a complete 14 system review, the patient complains of only the following symptoms, and all other reviewed systems are negative.  environmental allergies / daytime sleepiness and sleep talking.  She also underwent a breast reduction surgery in December 2013 at her last surgical procedure.   The patient social history: She is employed at  [employer],  teaching  physiology/ exercise sciences.  She is married with 2 children  No hisory of drugs she drinks about one cup of coffee a day.   family History : Patient's father died in Jan 26, 2012 he had survived initial treatment for colon cancer but died of liver cancer or metastasis.  He also was a  diabetic. The patient's mother has high cholesterol. Maternal aunt was diagnosed with lung cancer but has no smoking history. Age 58. Paternal uncle has sleep apnea but narcolepsy or narcolepsy like hypersomnia is not known in the family. Brother has gout/   ROS sleep related :  Epworth sleepiness score on Vyvanse was endorsed at 10points, FSS 44- the Epworth sleepiness score without medication was endorsed at 16 points.    Social History   Social History  . Marital Status: Married    Spouse Name: [name]   . Number of Children: 2  . 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 Smoking status: Never Smoker   . Smokeless tobacco: Never Used  . Alcohol Use: No  . Drug Use: No  . Sexual Activity: Not on file   Other Topics Concern  . Not on file   Social History Narrative   Patient is employed.    Patient married with 2 children.          Family History  Problem Relation Age of Onset  . Colon cancer Father     deceased  . Liver cancer Father 52    deceased     Past Medical History  Diagnosis Date  . Anxiety   . Narcolepsy without cataplexy 09/19/2014    Past Surgical History  Procedure Laterality Date  . Cesarean section  x2  . Lipoma     . Wisdom tooth extraction    . Colonoscopy  2009    family history of colon ca  . Dilitation & currettage/hystroscopy with novasure ablation  08/26/2012    Procedure: DILATATION & CURETTAGE/HYSTEROSCOPY WITH NOVASURE ABLATION;  Surgeon: Lenoard Aden, MD;  Location: [facility];  Service: Gynecology;  Laterality: N/A;    Current Outpatient Prescriptions  Medication Sig Dispense Refill  . albuterol (PROVENTIL HFA;VENTOLIN HFA) 108 (90 BASE) MCG/ACT inhaler Inhale 2 puffs into the lungs every 6 (six) hours as needed. For exercise-induced bronchospasm    . amphetamine-dextroamphetamine (ADDERALL) 10 MG tablet Take 1 tablet (10 mg total) by mouth daily with  breakfast. 30 tablet 0  . cholecalciferol (VITAMIN D) 1000 UNITS tablet Take 1,000 Units by mouth daily.    . fluconazole (DIFLUCAN) 150 MG tablet     . folic acid (FOLVITE) 1 MG tablet Take 2 mg by mouth daily.  3  . ibuprofen (ADVIL,MOTRIN) 200 MG tablet Take 800 mg by mouth every 6 (six) hours as needed. For muscle soreness    . lisdexamfetamine (VYVANSE) 70 MG capsule Take 1 capsule (70 mg total) by mouth daily. 30 capsule 0  . Methotrexate Sodium (METHOTREXATE, PF,) 250 MG/10ML injection INJECT 10MG  FOR 2 WEEKS, 15MG  FOR 2 WEEKS, INCREASE TO 20MG  DOSE IF LABS NORMAL  2  . Multiple Vitamin (MULTIVITAMIN WITH MINERALS) TABS Take 1 tablet by mouth daily.     No current facility-administered medications for this visit.    Allergies as of 09/18/2015 - Review Complete 09/18/2015  Allergen Reaction Noted  . Peg [polyethylene glycol] Itching and Nausea And Vomiting 09/19/2014    Vitals: BP 98/82 mmHg  Pulse 88  Resp 20  Ht 5\' 3"  (1.6 m)  Wt 139 lb (63.05 kg)  BMI 24.63 kg/m2 Last Weight:  Wt Readings from Last 1 Encounters:  09/18/15 139 lb (63.05 kg)   Last Height:   Ht Readings from Last 1 Encounters:  09/18/15 5\' 3"  (1.6 m)    Physical exam:  General: The patient is awake, alert and appears not in acute distress. The patient is well groomed. Head: Normocephalic, atraumatic. Neck is supple. Mallampati*3, neck circumference: 13.75 ,  Cardiovascular:  Regular rate and rhythm , without  murmurs or carotid bruit, and without distended neck veins. Respiratory: Lungs are clear to auscultation. Skin:  Without evidence of edema, or rash Trunk: normal posture.  Neurologic exam : The patient is awake and alert, oriented to place and time. Memory subjective described as intact.  There is a normal attention span & concentration ability on medication.  Speech is fluent without  dysarthria, dysphonia or aphasia. Mood and affect are appropriate.  Cranial nerves: Pupils are equal and  briskly reactive to light. Funduscopic exam without  evidence of pallor or edema.  Extraocular movements in vertical and horizontal planes intact and without nystagmus. Visual fields by finger perimetry are intact. Facial sensation intact to fine touch. Facial motor strength is symmetric and tongue and uvula move midline.  Deep tendon reflexes: in the  upper and lower extremities are symmetric and intact. Babinski maneuver response is downgoing.  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 2) Fatigue treated with vyvanse.  3) Patient has Rheumatoid arthritis.   Plan:  Treatment plan and additional workup :  Vyvanse refill 70 mg daily.  Rv in 12 month    Need 90 days supplies.     Debera Sterba, MD

## 2015-11-04 ENCOUNTER — Telehealth: Payer: Self-pay | Admitting: Neurology

## 2015-11-04 NOTE — Telephone Encounter (Signed)
Pt called said she picked up lisdexamfetamine (VYVANSE) 70 MG capsule and had to paid for it out of pocket (she got 2 days). She said [facility] said it needs PA and can call [phone]. She was told the approval date needs to be back dated to 11/01/15 so she will be able to get it at co-pay rate.

## 2015-11-05 NOTE — Telephone Encounter (Signed)
PA for vyvanse completed and was back dated to 10/05/2015-11/03/2018. PA # [id number].  Spoke to pt. She is aware. She actually pick up her vyvanse last night from the pharmacy and they reimbursed her copay.

## 2015-12-30 ENCOUNTER — Other Ambulatory Visit: Payer: Self-pay | Admitting: Neurology

## 2015-12-30 DIAGNOSIS — F900 Attention-deficit hyperactivity disorder, predominantly inattentive type: Secondary | ICD-10-CM

## 2015-12-30 DIAGNOSIS — G47419 Narcolepsy without cataplexy: Secondary | ICD-10-CM

## 2015-12-30 MED ORDER — LISDEXAMFETAMINE DIMESYLATE 70 MG PO CAPS: 70.0000 mg | ORAL_CAPSULE | Freq: Every day | ORAL | Status: DC

## 2015-12-30 NOTE — Telephone Encounter (Signed)
Spoke to pt and advised her that her RX for vyvnase is ready for pick up at the front desk.

## 2015-12-30 NOTE — Telephone Encounter (Signed)
Patient is calling to order a written Rx lisdexamfetamine(YVANSE)70 mg.  Thanks!

## 2016-03-30 ENCOUNTER — Other Ambulatory Visit: Payer: Self-pay | Admitting: Neurology

## 2016-03-30 DIAGNOSIS — F900 Attention-deficit hyperactivity disorder, predominantly inattentive type: Secondary | ICD-10-CM

## 2016-03-30 DIAGNOSIS — G47419 Narcolepsy without cataplexy: Secondary | ICD-10-CM

## 2016-03-30 MED ORDER — LISDEXAMFETAMINE DIMESYLATE 70 MG PO CAPS: 70.0000 mg | ORAL_CAPSULE | Freq: Every day | ORAL | Status: DC

## 2016-03-30 NOTE — Telephone Encounter (Signed)
Patient requesting refill of lisdexamfetamine (VYVANSE) 70 MG capsule

## 2016-03-30 NOTE — Telephone Encounter (Signed)
Sent to [facility] for review 

## 2016-03-30 NOTE — Telephone Encounter (Signed)
Sent to Dr. Anne Hahn to review- Dr. Epimenio Foot had to leave the office and did not get a chance to address this request.

## 2016-03-31 NOTE — Telephone Encounter (Signed)
Looks like this has been printed by Dr. Anne Hahn

## 2016-03-31 NOTE — Telephone Encounter (Signed)
Placed up front for pickup ° °

## 2016-04-01 NOTE — Telephone Encounter (Signed)
I left a message (per DPR) that pt's RX is ready for pick up at the front desk.

## 2016-06-26 ENCOUNTER — Telehealth: Payer: Self-pay | Admitting: Neurology

## 2016-06-26 DIAGNOSIS — G47419 Narcolepsy without cataplexy: Secondary | ICD-10-CM

## 2016-06-26 DIAGNOSIS — F900 Attention-deficit hyperactivity disorder, predominantly inattentive type: Secondary | ICD-10-CM

## 2016-06-26 MED ORDER — LISDEXAMFETAMINE DIMESYLATE 70 MG PO CAPS: 70.0000 mg | ORAL_CAPSULE | Freq: Every day | ORAL | 0 refills | Status: DC

## 2016-06-26 NOTE — Telephone Encounter (Signed)
Patient called to request refill of lisdexamfetamine (VYVANSE) 70 MG capsule

## 2016-06-26 NOTE — Telephone Encounter (Signed)
Refilled for 90 days. CD

## 2016-06-29 NOTE — Telephone Encounter (Signed)
I left a message on pt's cell hone number (per DPR) advising her that her RX for vyvanse is ready for pick up at the front desk and asked pt to call back with any further questions.

## 2016-07-24 ENCOUNTER — Ambulatory Visit (INDEPENDENT_AMBULATORY_CARE_PROVIDER_SITE_OTHER): Payer: BC Managed Care – PPO

## 2016-08-09 ENCOUNTER — Other Ambulatory Visit: Payer: Self-pay | Admitting: Rheumatology

## 2016-08-10 NOTE — Telephone Encounter (Signed)
Patient started Humira on 07/24/16 Please review and advise if you wanted her to continue PLQ or d/c, I have reviewed, not clear.

## 2016-08-10 NOTE — Telephone Encounter (Signed)
I have called patient to advise her to d/c PLQ use the Humira instead  She has orders She has appt.

## 2016-08-10 NOTE — Telephone Encounter (Signed)
Pt will not be needing plq anymore.  She will continue mtx while on humira for now.  We wanted to see her back jan 2018 for f-u. But I see there is an appt w/ some doctor 08-14-16.. Is that with [place] or another doctor. We don't need to see her back Aug 14, 2016.  She will need stanmding order  labs every 2 months.. Pls send if she does not have already

## 2016-08-14 ENCOUNTER — Ambulatory Visit: Payer: BC Managed Care – PPO | Admitting: Rheumatology

## 2016-08-14 ENCOUNTER — Telehealth: Payer: Self-pay | Admitting: Rheumatology

## 2016-08-14 NOTE — Telephone Encounter (Signed)
[facility] is requesting more information for the referral that was sent to them as to why the patient should be seen there.

## 2016-08-14 NOTE — Telephone Encounter (Signed)
I do not see a referral in Epic or SRS

## 2016-08-17 NOTE — Telephone Encounter (Signed)
This number is fast busy, does not connect when I call  [phone] is the number I have listed for the hand center, I have called and asked the hand center to send me a copy of the referral.

## 2016-08-20 ENCOUNTER — Telehealth: Payer: Self-pay | Admitting: Radiology

## 2016-08-20 NOTE — Telephone Encounter (Signed)
Have discussed with [facility], I think a referral was sent to them in error, she states she will disregard

## 2016-09-02 ENCOUNTER — Other Ambulatory Visit: Payer: Self-pay | Admitting: Pharmacist

## 2016-09-02 DIAGNOSIS — Z79899 Other long term (current) drug therapy: Secondary | ICD-10-CM

## 2016-09-02 LAB — CBC WITH DIFFERENTIAL/PLATELET
Basophils Absolute: 57 cells/uL (ref 0–200)
Basophils Relative: 1 %
Eosinophils Absolute: 114 cells/uL (ref 15–500)
Eosinophils Relative: 2 %
HCT: 43.7 % (ref 35.0–45.0)
Hemoglobin: 14.3 g/dL (ref 11.7–15.5)
Lymphocytes Relative: 30 %
Lymphs Abs: 1710 cells/uL (ref 850–3900)
MCH: 31 pg (ref 27.0–33.0)
MCHC: 32.7 g/dL (ref 32.0–36.0)
MCV: 94.6 fL (ref 80.0–100.0)
MPV: 10.4 fL (ref 7.5–12.5)
Monocytes Absolute: 570 cells/uL (ref 200–950)
Monocytes Relative: 10 %
Neutro Abs: 3249 cells/uL (ref 1500–7800)
Neutrophils Relative %: 57 %
Platelets: 288 10*3/uL (ref 140–400)
RBC: 4.62 MIL/uL (ref 3.80–5.10)
RDW: 12.8 % (ref 11.0–15.0)
WBC: 5.7 10*3/uL (ref 3.8–10.8)

## 2016-09-02 LAB — COMPLETE METABOLIC PANEL WITH GFR
ALT: 26 U/L (ref 6–29)
AST: 28 U/L (ref 10–35)
Albumin: 4.2 g/dL (ref 3.6–5.1)
Alkaline Phosphatase: 57 U/L (ref 33–115)
BUN: 19 mg/dL (ref 7–25)
CO2: 31 mmol/L (ref 20–31)
Calcium: 9.7 mg/dL (ref 8.6–10.2)
Chloride: 106 mmol/L (ref 98–110)
Creat: 0.87 mg/dL (ref 0.50–1.10)
GFR, Est African American: >89 mL/min (ref >=60)
GFR, Est Non African American: 79 mL/min (ref >=60)
Glucose, Bld: 88 mg/dL (ref 65–99)
Potassium: 5.2 mmol/L (ref 3.5–5.3)
Sodium: 141 mmol/L (ref 135–146)
Total Bilirubin: 0.5 mg/dL (ref 0.2–1.2)
Total Protein: 6.3 g/dL (ref 6.1–8.1)

## 2016-09-02 NOTE — Progress Notes (Signed)
Patient was initiated on Humira on 07/24/16.  She presents to the office today for her standing labs.  Standing labs discussed with Mr. Leane Call.  She will get standing labs today then every 2 months.

## 2016-09-03 NOTE — Progress Notes (Signed)
CMP with GFR and CBC with differential are normal.Please notify patient

## 2016-09-08 ENCOUNTER — Other Ambulatory Visit: Payer: Self-pay | Admitting: Rheumatology

## 2016-09-09 NOTE — Telephone Encounter (Signed)
Last Visit: 06/30/16 Next Visit: 11/26/16 Labs: 09/02/16 WNL  Okay to refill MTX?

## 2016-09-15 ENCOUNTER — Encounter: Payer: Self-pay | Admitting: Neurology

## 2016-09-15 ENCOUNTER — Ambulatory Visit (INDEPENDENT_AMBULATORY_CARE_PROVIDER_SITE_OTHER): Payer: BC Managed Care – PPO | Admitting: Neurology

## 2016-09-15 DIAGNOSIS — G47419 Narcolepsy without cataplexy: Secondary | ICD-10-CM

## 2016-09-15 DIAGNOSIS — G47429 Narcolepsy in conditions classified elsewhere without cataplexy: Secondary | ICD-10-CM

## 2016-09-15 MED ORDER — LISDEXAMFETAMINE DIMESYLATE 70 MG PO CAPS: 70.0000 mg | ORAL_CAPSULE | Freq: Every day | ORAL | 0 refills | Status: DC

## 2016-09-15 NOTE — Progress Notes (Signed)
[facility]  Provider:  Melvyn Novas, M D  Referring Provider: [name], MD Primary Care Physician:  Lenoard Aden, MD  Chief Complaint  Patient presents with  . Follow-up    Rm 11. No new concerns per patient.     HPI:  Kathleen Alvarez is a 50 y.o. female and  seen here as a revisit for medication refill.    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  She had this condition since college years and was first seen in this practice  In Feb 16, 2006. For the treatment of for hypersomnia she was started on Provigil but  in 6 months she had noted some irritability and anger on that medication, this in addition was more complicated as the patient became pregnant.  Provigil was therefore discontinued and she was started on Adderall , Tolerated much better for about a year she was eventually changed to Vyvanse in Feb 17, 2007 . In 05-15-16she was diagnosed with rheumatoid arthritis, takes MTX. this condition deepens her fatigue further.  She continues to do well on Vyvanse after over 7 years. She still sleeps 10-12 hours over a 24 hour period. She is not snoring. No apnea.   Interval history from 09/15/2016. I have the pleasure of seeing Dr. [name], PhD, for routine revisit. The patient introduced to tolerate Vyvanse which she will have taken for 10 years next year. Refills today. No evidence of hypertension, hypersomnia, hyperglycemia,   Review of Systems: Out of a complete 14 system review, the patient complains of only the following symptoms, and all other reviewed systems are negative.  environmental allergies / daytime sleepiness and sleep talking.  She also underwent a breast reduction surgery in December 2013 at her last surgical procedure.   The patient social history: She is employed at [employer], teaching  physiology/ exercise sciences.  She is married with 2 children   No hisory of drugs she drinks about one cup of coffee a day.   family History : Patient's father died in Feb 17, 2012 he had survived initial treatment for colon cancer but died of liver cancer or metastasis.  He also was a diabetic. The patient's mother has high cholesterol. Maternal aunt was diagnosed with lung cancer but has no smoking history. Age 48. Paternal uncle has sleep apnea but narcolepsy or narcolepsy like hypersomnia is not known in the family. Brother has gout/   ROS sleep related :  Epworth sleepiness score (on Vyvanse) was endorsed at 11points,  Without 17 points.  FSS 34-   Social History   Social History  . Marital status: Married    Spouse name: [name]   . Number of children: 2  . 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 Smoking status: Never Smoker  . Smokeless tobacco: Never Used  . Alcohol use No  . Drug use: No  . Sexual activity: Not on file   Other Topics Concern  . Not on file   Social History Narrative   Patient is employed.    Patient married with 2 children.          Family History  Problem Relation Age of Onset  . Colon cancer Father     deceased  . Liver cancer Father 6    deceased     Past Medical History:  Diagnosis Date  .  Anxiety   . 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 CESAREAN SECTION     x2  . COLONOSCOPY  2009   family history of colon ca  . DILITATION & CURRETTAGE/HYSTROSCOPY WITH NOVASURE ABLATION  08/26/2012   Procedure: DILATATION & CURETTAGE/HYSTEROSCOPY WITH NOVASURE ABLATION;  Surgeon: Lenoard Aden, MD;  Location: [facility];  Service: Gynecology;  Laterality: N/A;  . lipoma     . WISDOM TOOTH EXTRACTION      Current Outpatient Prescriptions  Medication Sig Dispense Refill  . albuterol (PROVENTIL HFA;VENTOLIN HFA) 108 (90 BASE) MCG/ACT inhaler Inhale 2 puffs into the lungs  every 6 (six) hours as needed. For exercise-induced bronchospasm    . amphetamine-dextroamphetamine (ADDERALL) 10 MG tablet Take 1 tablet (10 mg total) by mouth daily with breakfast. 30 tablet 0  . cholecalciferol (VITAMIN D) 1000 UNITS tablet Take 1,000 Units by mouth daily.    . fluconazole (DIFLUCAN) 150 MG tablet     . folic acid (FOLVITE) 1 MG tablet Take 2 mg by mouth daily.  3  . HUMIRA PEN 40 MG/0.8ML PNKT     . ibuprofen (ADVIL,MOTRIN) 200 MG tablet Take 800 mg by mouth every 6 (six) hours as needed. For muscle soreness    . lisdexamfetamine (VYVANSE) 70 MG capsule Take 1 capsule (70 mg total) by mouth daily. 90 capsule 0  . methotrexate 50 MG/2ML injection INJECT 0.8 ML EACH WEEK 10 mL 0  . Methotrexate Sodium (METHOTREXATE, PF,) 250 MG/10ML injection INJECT 10MG  FOR 2 WEEKS, 15MG  FOR 2 WEEKS, INCREASE TO 20MG  DOSE IF LABS NORMAL  2  . Multiple Vitamin (MULTIVITAMIN WITH MINERALS) TABS Take 1 tablet by mouth daily.     No current facility-administered medications for this visit.     Allergies as of 09/15/2016 - Review Complete 09/15/2016  Allergen Reaction Noted  . Peg [polyethylene glycol] Itching and Nausea And Vomiting 09/19/2014    Vitals: BP 102/64   Pulse 80   Resp 16   Ht 5\' 3"  (1.6 m)   Wt 136 lb (61.7 kg)   BMI 24.09 kg/m  Last Weight:  Wt Readings from Last 1 Encounters:  09/15/16 136 lb (61.7 kg)   Last Height:   Ht Readings from Last 1 Encounters:  09/15/16 5\' 3"  (1.6 m)    Physical exam:  General: The patient is awake, alert and appears not in acute distress. The patient is well groomed. Head: Normocephalic, atraumatic. Neck is supple. Mallampati*3, neck circumference: 13.75 ,  Cardiovascular:  Regular rate and rhythm , without  murmurs or carotid bruit, and without distended neck veins. Respiratory: Lungs are clear to auscultation. Skin:  Without evidence of edema, or rash. Trunk: normal posture.  Neurologic exam : The patient is awake and alert,  oriented to place and time. Memory subjective described as intact.  There is a normal attention span & concentration ability on medication.  Speech is fluent without  dysarthria, dysphonia or aphasia. Mood and affect are appropriate.  Cranial nerves: Pupils are equal and briskly reactive to light. Funduscopic exam without  evidence of pallor or edema.  Extraocular movements in vertical and horizontal planes intact and without nystagmus. Visual fields by finger perimetry are intact. Facial sensation intact to fine touch. Facial motor strength is symmetric and tongue and uvula move midline.  Deep tendon reflexes: in the  upper and lower extremities are symmetric and intact. Babinski maneuver response is downgoing. Assessment:  After physical  and neurologic examination, review of laboratory studies, imaging, neurophysiology testing and pre-existing records, assessment is :  1) Hypersomnia, proven to be Narcolepsy. 2) Fatigue treated with vyvanse.  3) Patient has Rheumatoid arthritis.   Plan:  Treatment plan and additional workup :  Vyvanse refill 70 mg daily.   Rv in 12 month    Need 90 days supplies.     Leiana Rund, MD

## 2016-09-15 NOTE — Patient Instructions (Signed)
  Lab tests are done through rheumatology, Dr. Corliss Skains.

## 2016-10-01 ENCOUNTER — Other Ambulatory Visit: Payer: Self-pay | Admitting: Rheumatology

## 2016-10-01 NOTE — Telephone Encounter (Signed)
Last Visit: 06/30/16 Next Visit: 11/26/16 Labs: 09/02/16 WNL TB Gold: 07/06/16 Neg  Okay to refill Humira?

## 2016-11-17 ENCOUNTER — Ambulatory Visit: Payer: BC Managed Care – PPO | Admitting: Rheumatology

## 2016-11-19 NOTE — Progress Notes (Signed)
Office Visit Note  Patient: Kathleen Alvarez             Date of Birth: [birth year]           MRN: [id number]             PCP: Lovenia Kim, MD Referring: Brien Few, MD Visit Date: 11/26/2016 Occupation: @GUAROCC @    Subjective:  Follow-up on rheumatoid arthritis   History of Present Illness: Kathleen Alvarez is a 51 y.o. female with history of sero positive rheumatoid arthritis. She states she's been doing quite well on combination of methotrexate and Humira. She denies any joint swelling. She denies any joint pain. She is scheduled to have septoplasty in 1 week.  Activities of Daily Living:  Patient reports morning stiffness for 0 minutes.   Patient Denies nocturnal pain.  Difficulty dressing/grooming: Denies Difficulty climbing stairs: Denies Difficulty getting out of chair: Denies Difficulty using hands for taps, buttons, cutlery, and/or writing: Denies   Review of Systems  Constitutional: Negative for fatigue, night sweats, weight gain, weight loss and weakness.  HENT: Negative for mouth sores, trouble swallowing, trouble swallowing, mouth dryness and nose dryness.   Eyes: Negative for pain, redness, visual disturbance and dryness.  Respiratory: Negative for cough, shortness of breath and difficulty breathing.   Cardiovascular: Negative for chest pain, palpitations, hypertension, irregular heartbeat and swelling in legs/feet.  Gastrointestinal: Negative for blood in stool, constipation and diarrhea.  Endocrine: Negative for increased urination.  Genitourinary: Negative for vaginal dryness.  Musculoskeletal: Negative for arthralgias, joint pain, joint swelling, myalgias, muscle weakness, morning stiffness, muscle tenderness and myalgias.  Skin: Negative for color change, rash, hair loss, skin tightness, ulcers and sensitivity to sunlight.  Allergic/Immunologic: Negative for susceptible to infections.  Neurological: Negative for dizziness, memory loss and night sweats.    Hematological: Negative for swollen glands.  Psychiatric/Behavioral: Negative for depressed mood and sleep disturbance. The patient is not nervous/anxious.     PMFS History:  Patient Active Problem List   Diagnosis Date Noted  . Narcolepsy 09/15/2016  . 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 Anxiety   . 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 Colon cancer Father     deceased  . Liver cancer Father 14    deceased    Past Surgical History:  Procedure Laterality Date  . CESAREAN SECTION     x2  . COLONOSCOPY  2009   family history of colon ca  . DILITATION & CURRETTAGE/HYSTROSCOPY WITH NOVASURE ABLATION  08/26/2012   Procedure: DILATATION & CURETTAGE/HYSTEROSCOPY WITH NOVASURE ABLATION;  Surgeon: Lovenia Kim, MD;  Location: [facility];  Service: Gynecology;  Laterality: N/A;  . lipoma     . WISDOM TOOTH EXTRACTION     Social History   Social History Narrative   Patient is employed.    Patient married with 2 children.           Objective: Vital Signs: BP 110/60   Pulse 62   Resp 14   Ht 5' 3"  (1.6 m)   Wt 143 lb (64.9 kg)   BMI 25.33 kg/m    Physical Exam  Constitutional: She is oriented to person, place, and time. She appears well-developed and well-nourished.  HENT:  Head: Normocephalic and atraumatic.  Eyes: Conjunctivae and EOM are normal.  Neck: Normal range of motion.  Cardiovascular: Normal rate, regular rhythm, normal heart sounds and intact distal pulses.   Pulmonary/Chest: Effort normal and breath sounds normal.  Abdominal: Soft. Bowel sounds are normal.  Lymphadenopathy:    She has no cervical adenopathy.  Neurological: She is alert and oriented to person, place, and time.  Skin: Skin is warm and dry. Capillary refill takes less than 2 seconds.   Psychiatric: She has a normal mood and affect. Her behavior is normal.  Nursing note and vitals reviewed.    Musculoskeletal Exam: C-spine, thoracic, lumbar spine good range of motion. Shoulder joints, elbow joints wrist joint MCPs PIPs DIPs with good range of motion with no synovitis. She does small palpable area of tenderness on her right third middle phalanx which could be possible rheumatoid nodule. Hip joints knee joints ankles MTPs PIPs with good range of motion with no synovitis.  CDAI Exam: CDAI Homunculus Exam:   Joint Counts:  CDAI Tender Joint count: 0 CDAI Swollen Joint count: 0  Global Assessments:  Patient Global Assessment: 3 Provider Global Assessment: 2  CDAI Calculated Score: 5    Investigation: Findings:  March 2016:  Rheumatoid factor was 17.5.  CBC, comprehensive metabolic panel, sed rate, TSH, C-reactive protein, ANA, Ro and La antibodies were all negative.   01/17/2015 X-rays of bilateral hands, 2 views in the office today, showed minimal PIP narrowing, no MCP changes, no erosive changes Bilateral feet x-rays were also unremarkable with minimal PIP and DIP narrowing.  No MTP changes or erosive changes were noted.  She had a left small calcaneal spur.   01/17/2015 negative CK, TSH, CCP, hep panel, TB Gold, SPEP, immunoglobulins, HIV CCP elevated 26.2   07/2016 TB gold negative, CBC and CMP normal , 09/02/2016 CBC normal, CMP normal  Eye exam 02/22/2016 normal  Orders Only on 09/02/2016  Component Date Value Ref Range Status  . WBC 09/02/2016 5.7  3.8 - 10.8 K/uL Final  . RBC 09/02/2016 4.62  3.80 - 5.10 MIL/uL Final  . Hemoglobin 09/02/2016 14.3  11.7 - 15.5 g/dL Final  . HCT 09/02/2016 43.7  35.0 - 45.0 % Final  . MCV 09/02/2016 94.6  80.0 - 100.0 fL Final  . MCH 09/02/2016 31.0  27.0 - 33.0 pg Final  . MCHC 09/02/2016 32.7  32.0 - 36.0 g/dL Final  . RDW 09/02/2016 12.8  11.0 - 15.0 % Final  . Platelets 09/02/2016 288  140 - 400 K/uL Final  . MPV  09/02/2016 10.4  7.5 - 12.5 fL Final  . Neutro Abs 09/02/2016 3249  1,500 - 7,800 cells/uL Final  . Lymphs Abs 09/02/2016 1710  850 - 3,900 cells/uL Final  . Monocytes Absolute 09/02/2016 570  200 - 950 cells/uL Final  . Eosinophils Absolute 09/02/2016 114  15 - 500 cells/uL Final  . Basophils Absolute 09/02/2016 57  0 - 200 cells/uL Final  . Neutrophils Relative % 09/02/2016 57  % Final  . Lymphocytes Relative 09/02/2016 30  % Final  . Monocytes Relative 09/02/2016 10  % Final  . Eosinophils Relative 09/02/2016 2  % Final  . Basophils Relative 09/02/2016 1  % Final  . Smear Review 09/02/2016 Criteria for review not met   Final  . Sodium 09/02/2016 141  135 - 146 mmol/L Final  . Potassium 09/02/2016 5.2  3.5 - 5.3 mmol/L Final  . Chloride 09/02/2016 106  98 - 110 mmol/L Final  . CO2 09/02/2016 31  20 - 31 mmol/L Final  . Glucose, Bld 09/02/2016 88  65 - 99 mg/dL Final  . BUN 09/02/2016 19  7 - 25 mg/dL Final  . Creat 09/02/2016 0.87  0.50 - 1.10 mg/dL Final  . Total Bilirubin 09/02/2016 0.5  0.2 - 1.2 mg/dL Final  . Alkaline Phosphatase 09/02/2016 57  33 - 115 U/L Final  . AST 09/02/2016 28  10 - 35 U/L Final  . ALT 09/02/2016 26  6 - 29 U/L Final  . Total Protein 09/02/2016 6.3  6.1 - 8.1 g/dL Final  . Albumin 09/02/2016 4.2  3.6 - 5.1 g/dL Final  . Calcium 09/02/2016 9.7  8.6 - 10.2 mg/dL Final  . GFR, Est African American 09/02/2016 >89  >=60 mL/min Final  . GFR, Est Non African American 09/02/2016 79  >=60 mL/min Final     Imaging: No results found.  Speciality Comments: No specialty comments available.    Procedures:  No procedures performed Allergies: Peg [polyethylene glycol]   Assessment / Plan:     Visit Diagnoses: Rheumatoid arthritis involving multiple sites with positive rheumatoid factor (HCC) - +RF +CCP . She has no synovitis on examination she is doing quite well on combination of methotrexate and Humira. She has a possible rheumatoid nodule over her right  second finger over the middle phalanx we will observe it for right now.  High risk medication use - Methotrexate 0.8 ML subcutaneously every week, folic acid 2 mg by mouth daily, Humira 50 mg subcutaneous Q o week 10/17 - Plan: CBC with Differential/Platelet, COMPLETE METABOLIC PANEL WITH GFR, CBC with Differential/Platelet, COMPLETE METABOLIC PANEL WITH GFR, CBC with Differential/Platelet, COMPLETE METABOLIC PANEL WITH GFR SHE will have labs every 3 months to monitor for drug toxicity.  She is scheduled to have surgery for deviated nasal septum in one week. Have advised her to discontinue use Humira and methotrexate. She may resume methotrexate 1 week after surgery and Humira 2 weeks after surgery if there is no infection and the healing process is going well. She will have to coordinate that with her surgeon.   Orders: Orders Placed This Encounter  Procedures  . CBC with Differential/Platelet  . COMPLETE METABOLIC PANEL WITH GFR  . CBC with Differential/Platelet  . COMPLETE METABOLIC PANEL WITH GFR   No orders of the defined types were placed in this encounter.   Face-to-face time spent with patient was 30 minutes. 50% of time was spent in counseling and coordination of care.  Follow-Up Instructions: Return in about 5 months (around 04/25/2017) for Rheumatoid arthritis.   Bo Merino, MD  Note - This record has been created using Editor, commissioning.  Chart creation errors have been sought, but may not always  have been located. Such creation errors do not reflect on  the standard of medical care.

## 2016-11-24 ENCOUNTER — Other Ambulatory Visit: Payer: Self-pay | Admitting: Rheumatology

## 2016-11-24 ENCOUNTER — Telehealth: Payer: Self-pay | Admitting: Rheumatology

## 2016-11-24 NOTE — Telephone Encounter (Signed)
Patient called to let you know that she will be doing labs before her Thursday appointment.

## 2016-11-24 NOTE — Telephone Encounter (Signed)
Last Visit: 06/30/16 Next Visit: 11/26/16 Labs: 09/02/16 WNL Left message to remind patient she is due for labs.   Okay to refill MTX?

## 2016-11-25 ENCOUNTER — Other Ambulatory Visit: Payer: Self-pay | Admitting: Rheumatology

## 2016-11-25 ENCOUNTER — Other Ambulatory Visit: Payer: Self-pay | Admitting: *Deleted

## 2016-11-25 MED ORDER — "TUBERCULIN SYRINGE 27G X 1/2"" 1 ML MISC": 3 refills | Status: DC

## 2016-11-25 NOTE — Telephone Encounter (Signed)
Last Visit: 06/30/16 Next Visit: 11/26/16 Labs: 09/02/16 WNL Left message to remind patient she is due for labs.  Okay to refill Folic Acid?

## 2016-11-25 NOTE — Telephone Encounter (Signed)
Last Visit: 06/30/16 Next Visit: 11/26/16 Labs: 09/02/16 WNL Left message to remind patient she is due for labs.  Okay to refill Syringes?

## 2016-11-26 ENCOUNTER — Ambulatory Visit (INDEPENDENT_AMBULATORY_CARE_PROVIDER_SITE_OTHER): Payer: BC Managed Care – PPO | Admitting: Rheumatology

## 2016-11-26 ENCOUNTER — Encounter: Payer: Self-pay | Admitting: Rheumatology

## 2016-11-26 VITALS — BP 110/60 | HR 62 | Resp 14 | Ht 63.0 in | Wt 143.0 lb

## 2016-11-26 DIAGNOSIS — Z79899 Other long term (current) drug therapy: Secondary | ICD-10-CM | POA: Diagnosis not present

## 2016-11-26 DIAGNOSIS — M0579 Rheumatoid arthritis with rheumatoid factor of multiple sites without organ or systems involvement: Secondary | ICD-10-CM

## 2016-11-26 LAB — CBC WITH DIFFERENTIAL/PLATELET
Basophils Absolute: 0 cells/uL (ref 0–200)
Basophils Relative: 0 %
Eosinophils Absolute: 74 cells/uL (ref 15–500)
Eosinophils Relative: 1 %
HCT: 41.1 % (ref 35.0–45.0)
Hemoglobin: 13.7 g/dL (ref 11.7–15.5)
Lymphocytes Relative: 27 %
Lymphs Abs: 1998 cells/uL (ref 850–3900)
MCH: 31.1 pg (ref 27.0–33.0)
MCHC: 33.3 g/dL (ref 32.0–36.0)
MCV: 93.2 fL (ref 80.0–100.0)
MPV: 10.6 fL (ref 7.5–12.5)
Monocytes Absolute: 518 cells/uL (ref 200–950)
Monocytes Relative: 7 %
Neutro Abs: 4810 cells/uL (ref 1500–7800)
Neutrophils Relative %: 65 %
Platelets: 263 10*3/uL (ref 140–400)
RBC: 4.41 MIL/uL (ref 3.80–5.10)
RDW: 13.6 % (ref 11.0–15.0)
WBC: 7.4 10*3/uL (ref 3.8–10.8)

## 2016-11-26 LAB — COMPLETE METABOLIC PANEL WITH GFR
ALT: 36 U/L — ABNORMAL HIGH (ref 6–29)
AST: 30 U/L (ref 10–35)
Albumin: 4 g/dL (ref 3.6–5.1)
Alkaline Phosphatase: 51 U/L (ref 33–115)
BUN: 18 mg/dL (ref 7–25)
CO2: 30 mmol/L (ref 20–31)
Calcium: 9.6 mg/dL (ref 8.6–10.2)
Chloride: 103 mmol/L (ref 98–110)
Creat: 0.79 mg/dL (ref 0.50–1.10)
GFR, Est African American: >89 mL/min (ref >=60)
GFR, Est Non African American: 89 mL/min (ref >=60)
Glucose, Bld: 92 mg/dL (ref 65–99)
Potassium: 4.6 mmol/L (ref 3.5–5.3)
Sodium: 141 mmol/L (ref 135–146)
Total Bilirubin: 0.7 mg/dL (ref 0.2–1.2)
Total Protein: 6.3 g/dL (ref 6.1–8.1)

## 2016-11-26 NOTE — Patient Instructions (Signed)
Standing Labs We placed an order today for your standing lab work.    Please come back and get your standing labs in May and every 3 months  We have open lab Monday through Friday from 8:30-11:30 AM and 1:30-4 PM at the office of Dr. Princetta Uplinger/Naitik Panwala, PA.   The office is located at [address], [address], [place], [place] [address] No appointment is necessary.   Labs are drawn by [facility].  You may receive a bill from [facility] for your lab work.    

## 2016-11-27 NOTE — Progress Notes (Signed)
Labs normal.

## 2016-12-23 ENCOUNTER — Other Ambulatory Visit: Payer: Self-pay | Admitting: Neurology

## 2016-12-23 DIAGNOSIS — G47419 Narcolepsy without cataplexy: Secondary | ICD-10-CM

## 2016-12-23 MED ORDER — LISDEXAMFETAMINE DIMESYLATE 70 MG PO CAPS
70.0000 mg | ORAL_CAPSULE | Freq: Every day | ORAL | 0 refills | Status: DC
Start: 2016-12-23 — End: 2017-03-15

## 2016-12-23 NOTE — Telephone Encounter (Signed)
I called pt and left a detailed message on her cell number (per DPR) advising her that her vyvanse RX is available for pick up at the front desk and to call back with any questions or concerns.

## 2016-12-23 NOTE — Addendum Note (Signed)
Addended by: Geronimo Running A on: 12/23/2016 10:32 AM   Modules accepted: Orders

## 2016-12-23 NOTE — Telephone Encounter (Signed)
Patient called office requesting refill for lisdexamfetamine (VYVANSE) 70 MG capsule. °

## 2017-01-14 ENCOUNTER — Other Ambulatory Visit: Payer: Self-pay | Admitting: Rheumatology

## 2017-01-14 NOTE — Telephone Encounter (Signed)
ok 

## 2017-01-14 NOTE — Telephone Encounter (Signed)
Last Visit: 11/26/16 Next Visit: 04/26/17 Labs: 11/26/16 WNL TB Gold: 07/2016 Neg  Okay to refill Humira?

## 2017-02-26 ENCOUNTER — Other Ambulatory Visit: Payer: Self-pay

## 2017-02-26 DIAGNOSIS — Z79899 Other long term (current) drug therapy: Secondary | ICD-10-CM

## 2017-02-26 LAB — CBC WITH DIFFERENTIAL/PLATELET
Basophils Absolute: 57 cells/uL (ref 0–200)
Basophils Relative: 1 %
Eosinophils Absolute: 57 cells/uL (ref 15–500)
Eosinophils Relative: 1 %
HCT: 43.6 % (ref 35.0–45.0)
Hemoglobin: 14.3 g/dL (ref 11.7–15.5)
Lymphocytes Relative: 31 %
Lymphs Abs: 1767 cells/uL (ref 850–3900)
MCH: 30.5 pg (ref 27.0–33.0)
MCHC: 32.8 g/dL (ref 32.0–36.0)
MCV: 93 fL (ref 80.0–100.0)
MPV: 11.1 fL (ref 7.5–12.5)
Monocytes Absolute: 684 cells/uL (ref 200–950)
Monocytes Relative: 12 %
Neutro Abs: 3135 cells/uL (ref 1500–7800)
Neutrophils Relative %: 55 %
Platelets: 289 10*3/uL (ref 140–400)
RBC: 4.69 MIL/uL (ref 3.80–5.10)
RDW: 13.8 % (ref 11.0–15.0)
WBC: 5.7 10*3/uL (ref 3.8–10.8)

## 2017-02-27 LAB — COMPLETE METABOLIC PANEL WITH GFR
ALT: 24 U/L (ref 6–29)
AST: 26 U/L (ref 10–35)
Albumin: 4 g/dL (ref 3.6–5.1)
Alkaline Phosphatase: 62 U/L (ref 33–115)
BUN: 17 mg/dL (ref 7–25)
CO2: 26 mmol/L (ref 20–31)
Calcium: 9.7 mg/dL (ref 8.6–10.2)
Chloride: 102 mmol/L (ref 98–110)
Creat: 0.92 mg/dL (ref 0.50–1.10)
GFR, Est African American: 85 mL/min (ref >=60)
GFR, Est Non African American: 73 mL/min (ref >=60)
Glucose, Bld: 86 mg/dL (ref 65–99)
Potassium: 5.2 mmol/L (ref 3.5–5.3)
Sodium: 139 mmol/L (ref 135–146)
Total Bilirubin: 0.9 mg/dL (ref 0.2–1.2)
Total Protein: 6.3 g/dL (ref 6.1–8.1)

## 2017-03-13 ENCOUNTER — Other Ambulatory Visit: Payer: Self-pay | Admitting: Rheumatology

## 2017-03-15 ENCOUNTER — Other Ambulatory Visit: Payer: Self-pay | Admitting: Neurology

## 2017-03-15 DIAGNOSIS — G47419 Narcolepsy without cataplexy: Secondary | ICD-10-CM

## 2017-03-15 MED ORDER — LISDEXAMFETAMINE DIMESYLATE 70 MG PO CAPS: 70.0000 mg | ORAL_CAPSULE | Freq: Every day | ORAL | 0 refills | Status: DC

## 2017-03-15 NOTE — Telephone Encounter (Signed)
Last Visit: 11/26/16 Next Visit: 04/26/17 Labs: 02/26/17 WNL  Okay to refill MTX?

## 2017-03-15 NOTE — Telephone Encounter (Signed)
Patient called office requesting refill for lisdexamfetamine (VYVANSE) 70 MG capsule.

## 2017-03-15 NOTE — Telephone Encounter (Signed)
I called pt. I left a detailed message on her cell phone number advising her that her RX is ready for pick up at the front desk and to call back with questions or concerns.

## 2017-03-15 NOTE — Addendum Note (Signed)
Addended by: Geronimo Running A on: 03/15/2017 10:04 AM   Modules accepted: Orders

## 2017-03-15 NOTE — Telephone Encounter (Signed)
I called pt, her last vyvanse RX was written on 3/21. Its not due for refill until 6/21. Pt states that she is leaving for [place] on Sunday and wishes to bring the paper rx to [place] to fill closer to 6/21. I advise her that Dr Vickey Huger is out of the office and I will need to send this to the work in physician for review and approval. Pt verbalized understanding.

## 2017-04-09 ENCOUNTER — Other Ambulatory Visit: Payer: Self-pay | Admitting: Rheumatology

## 2017-04-09 NOTE — Telephone Encounter (Signed)
Last Visit: 11/26/16 Next Visit: 04/26/17 Labs: 02/26/17 WNL TB Gold: 07/2016 Neg   Okay to refill per Dr. Corliss Skains

## 2017-04-26 ENCOUNTER — Ambulatory Visit: Payer: BC Managed Care – PPO | Admitting: Rheumatology

## 2017-05-03 DIAGNOSIS — M0579 Rheumatoid arthritis with rheumatoid factor of multiple sites without organ or systems involvement: Secondary | ICD-10-CM | POA: Insufficient documentation

## 2017-05-03 NOTE — Progress Notes (Signed)
Office Visit Note  Patient: Kathleen Alvarez             Date of Birth: 1965/11/02           MRN: [id number]             PCP: [name], MD Referring: [name], MD Visit Date: 05/07/2017 Occupation: @GUAROCC @    Subjective:  Feet pain.   History of Present Illness: Kathleen Alvarez is a 51 y.o. female with history of sero positive rheumatoid arthritis. She states she's been having some discomfort over bilateral feet which she describes over bilateral fifth MTP joint. None of the other joints are painful. She denies any joint swelling. She has been tolerating her medications. He is stiff in the morning  Activities of Daily Living:  Patient reports morning stiffness for 0 minute.   Patient Denies nocturnal pain.  Difficulty dressing/grooming: Denies Difficulty climbing stairs: Denies Difficulty getting out of chair: Denies Difficulty using hands for taps, buttons, cutlery, and/or writing: Denies   Review of Systems  Constitutional: Negative for fatigue, night sweats, weight gain, weight loss and weakness.  HENT: Negative for mouth sores, trouble swallowing, trouble swallowing, mouth dryness and nose dryness.   Eyes: Negative for pain, redness, visual disturbance and dryness.  Respiratory: Negative for cough, shortness of breath and difficulty breathing.   Cardiovascular: Negative for chest pain, palpitations, hypertension, irregular heartbeat and swelling in legs/feet.  Gastrointestinal: Negative for blood in stool, constipation and diarrhea.  Endocrine: Negative for increased urination.  Genitourinary: Negative for vaginal dryness.  Musculoskeletal: Positive for arthralgias and joint pain. Negative for joint swelling, myalgias, muscle weakness, morning stiffness, muscle tenderness and myalgias.  Skin: Negative for color change, rash, hair loss, skin tightness, ulcers and sensitivity to sunlight.  Allergic/Immunologic: Negative for susceptible to infections.  Neurological:  Negative for dizziness, memory loss and night sweats.  Hematological: Negative for swollen glands.  Psychiatric/Behavioral: Negative for depressed mood and sleep disturbance. The patient is nervous/anxious.     PMFS History:  Patient Active Problem List   Diagnosis Date Noted  . Rheumatoid arthritis involving multiple sites with positive rheumatoid factor (HCC) 05/03/2017  . Narcolepsy 09/15/2016  . 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 Anxiety   . 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 Colon cancer Father        deceased  . Liver cancer Father 69       deceased    Past Surgical History:  Procedure Laterality Date  . CESAREAN SECTION     x2  . COLONOSCOPY  2009   family history of colon ca  . DILITATION & CURRETTAGE/HYSTROSCOPY WITH NOVASURE ABLATION  08/26/2012   Procedure: DILATATION & CURETTAGE/HYSTEROSCOPY WITH NOVASURE ABLATION;  Surgeon: 08/28/2012, MD;  Location: [facility];  Service: Gynecology;  Laterality: N/A;  . lipoma     . WISDOM TOOTH EXTRACTION     Social History   Social History Narrative   Patient is employed.    Patient married with 2 children.           Objective: Vital Signs: BP 103/67 (BP Location: Left Arm, Patient Position: Sitting, Cuff Size: Normal)   Pulse 75   Ht 5\' 3"  (1.6 m)   Wt 141 lb (64 kg)   BMI 24.98 kg/m    Physical Exam  Constitutional: She is oriented to person, place, and time. She appears well-developed and well-nourished.  HENT:  Head: Normocephalic and atraumatic.  Eyes: Conjunctivae and EOM are normal.  Neck: Normal range of motion.  Cardiovascular: Normal rate, regular rhythm, normal heart sounds and intact distal pulses.   Pulmonary/Chest: Effort normal and breath sounds normal.  Abdominal: Soft. Bowel  sounds are normal.  Lymphadenopathy:    She has no cervical adenopathy.  Neurological: She is alert and oriented to person, place, and time.  Skin: Skin is warm and dry. Capillary refill takes less than 2 seconds.  Psychiatric: She has a normal mood and affect. Her behavior is normal.  Nursing note and vitals reviewed.    Musculoskeletal Exam: C-spine and thoracic lumbar spine good range of motion. Shoulder joints elbow joints wrist joint MCPs PIPs DIPs with good range of motion. Hip joints knee joints ankles MTPs PIPs with good range of motion. No synovitis was noted in any of her joints.  CDAI Exam: CDAI Homunculus Exam:   Joint Counts:  CDAI Tender Joint count: 0 CDAI Swollen Joint count: 0  Global Assessments:  Patient Global Assessment: 2 Provider Global Assessment: 1  CDAI Calculated Score: 3    Investigation: No additional findings. CBC Latest Ref Rng & Units 02/26/2017 11/26/2016 09/02/2016  WBC 3.8 - 10.8 K/uL 5.7 7.4 5.7  Hemoglobin 11.7 - 15.5 g/dL 53.9 76.7 34.1  Hematocrit 35.0 - 45.0 % 43.6 41.1 43.7  Platelets 140 - 400 K/uL 289 263 288   CMP     Component Value Date/Time   NA 139 02/26/2017 1102   K 5.2 02/26/2017 1102   CL 102 02/26/2017 1102   CO2 26 02/26/2017 1102   GLUCOSE 86 02/26/2017 1102   BUN 17 02/26/2017 1102   CREATININE 0.92 02/26/2017 1102   CALCIUM 9.7 02/26/2017 1102   PROT 6.3 02/26/2017 1102   ALBUMIN 4.0 02/26/2017 1102   AST 26 02/26/2017 1102   ALT 24 02/26/2017 1102   ALKPHOS 62 02/26/2017 1102   BILITOT 0.9 02/26/2017 1102   GFRNONAA 73 02/26/2017 1102   GFRAA 85 02/26/2017 1102   07/2016 TB gold negative Imaging: No results found.  Speciality Comments: No specialty comments available.    Procedures:  No procedures performed Allergies: Peg [polyethylene glycol]   Assessment / Plan:     Visit Diagnoses: Rheumatoid arthritis involving multiple sites with positive rheumatoid factor (HCC) - Positive RF, positive  anti-CCP. She has no synovitis on examination today. She describes some tenderness over the MTP joints but not today. She appears to be doing quite well on current medication regimen. She wants to reduce her methotrexate dose although she had a flare in the past. When she reduce methotrexate to 0.6 mL every week. She will try reducing her methotrexate to 0.7 mL subcutaneous every week.  High risk medication use - Methotrexate 0.8 ML subcutaneous every week, folic acid 2 mg by mouth daily, Humira 50 mg subcutaneous every other week (October 2017) - Plan: CBC with Differential/Platelet, COMPLETE METABOLIC PANEL WITH GFR, Quantiferon tb gold assay (blood) today and then her labs will be done every 3 months to monitor for drug toxicity.  Narcolepsy   Attention deficit hyperactivity disorder (ADHD),    Orders: Orders Placed This Encounter  Procedures  . Quantiferon tb gold assay (blood)   No orders of the defined types were placed in this encounter.   Face-to-face time spent with patient was 20 minutes. 50% of time was spent in counseling  and coordination of care.  Follow-Up Instructions: Return in about 5 months (around 10/07/2017) for Rheumatoid arthritis.   Pollyann Savoy, MD  Note - This record has been created using Animal nutritionist.  Chart creation errors have been sought, but may not always  have been located. Such creation errors do not reflect on  the standard of medical care.

## 2017-05-07 ENCOUNTER — Encounter: Payer: Self-pay | Admitting: Rheumatology

## 2017-05-07 ENCOUNTER — Ambulatory Visit (INDEPENDENT_AMBULATORY_CARE_PROVIDER_SITE_OTHER): Payer: BC Managed Care – PPO | Admitting: Rheumatology

## 2017-05-07 VITALS — BP 103/67 | HR 75 | Ht 63.0 in | Wt 141.0 lb

## 2017-05-07 DIAGNOSIS — G47429 Narcolepsy in conditions classified elsewhere without cataplexy: Secondary | ICD-10-CM

## 2017-05-07 DIAGNOSIS — Z79899 Other long term (current) drug therapy: Secondary | ICD-10-CM | POA: Diagnosis not present

## 2017-05-07 DIAGNOSIS — F900 Attention-deficit hyperactivity disorder, predominantly inattentive type: Secondary | ICD-10-CM | POA: Diagnosis not present

## 2017-05-07 DIAGNOSIS — M0579 Rheumatoid arthritis with rheumatoid factor of multiple sites without organ or systems involvement: Secondary | ICD-10-CM | POA: Diagnosis not present

## 2017-05-07 LAB — CBC WITH DIFFERENTIAL/PLATELET
Basophils Absolute: 87 cells/uL (ref 0–200)
Basophils Relative: 1 %
Eosinophils Absolute: 87 cells/uL (ref 15–500)
Eosinophils Relative: 1 %
HCT: 45.1 % — ABNORMAL HIGH (ref 35.0–45.0)
Hemoglobin: 15.4 g/dL (ref 11.7–15.5)
Lymphocytes Relative: 21 %
Lymphs Abs: 1827 cells/uL (ref 850–3900)
MCH: 32.2 pg (ref 27.0–33.0)
MCHC: 34.1 g/dL (ref 32.0–36.0)
MCV: 94.2 fL (ref 80.0–100.0)
MPV: 10.9 fL (ref 7.5–12.5)
Monocytes Absolute: 783 cells/uL (ref 200–950)
Monocytes Relative: 9 %
Neutro Abs: 5916 cells/uL (ref 1500–7800)
Neutrophils Relative %: 68 %
Platelets: 294 10*3/uL (ref 140–400)
RBC: 4.79 MIL/uL (ref 3.80–5.10)
RDW: 13.3 % (ref 11.0–15.0)
WBC: 8.7 10*3/uL (ref 3.8–10.8)

## 2017-05-07 NOTE — Patient Instructions (Signed)
Standing Labs We placed an order today for your standing lab work.    Please come back and get your standing labs in November and every 3 months  We have open lab Monday through Friday from 8:30-11:30 AM and 1:30-4 PM at the office of Dr. Lorien Shingler.   The office is located at [address], [address], [place], [place] [address] No appointment is necessary.   Labs are drawn by [facility].  You may receive a bill from [facility] for your lab work. If you have any questions regarding directions or hours of operation,  please call [phone].    

## 2017-05-08 LAB — COMPLETE METABOLIC PANEL WITH GFR
ALT: 21 U/L (ref 6–29)
AST: 23 U/L (ref 10–35)
Albumin: 4.4 g/dL (ref 3.6–5.1)
Alkaline Phosphatase: 65 U/L (ref 33–115)
BUN: 21 mg/dL (ref 7–25)
CO2: 26 mmol/L (ref 20–31)
Calcium: 9.7 mg/dL (ref 8.6–10.2)
Chloride: 105 mmol/L (ref 98–110)
Creat: 0.86 mg/dL (ref 0.50–1.10)
GFR, Est African American: >89 mL/min (ref >=60)
GFR, Est Non African American: 80 mL/min (ref >=60)
Glucose, Bld: 86 mg/dL (ref 65–99)
Potassium: 4.6 mmol/L (ref 3.5–5.3)
Sodium: 139 mmol/L (ref 135–146)
Total Bilirubin: 0.5 mg/dL (ref 0.2–1.2)
Total Protein: 6.7 g/dL (ref 6.1–8.1)

## 2017-05-09 LAB — QUANTIFERON TB GOLD ASSAY (BLOOD)
Interferon Gamma Release Assay: NEGATIVE
Mitogen-Nil: >10 IU/mL
Quantiferon Nil Value: 0.03 IU/mL
Quantiferon Tb Ag Minus Nil Value: 0.01 IU/mL

## 2017-05-09 NOTE — Progress Notes (Signed)
WNL

## 2017-06-10 ENCOUNTER — Other Ambulatory Visit: Payer: Self-pay | Admitting: *Deleted

## 2017-06-10 MED ORDER — METHOTREXATE SODIUM CHEMO INJECTION 50 MG/2ML: INTRAMUSCULAR | 0 refills | Status: DC

## 2017-06-10 NOTE — Telephone Encounter (Signed)
Refill request received via fax for MTX  Last Visit: 05/07/17 Next Visit: 09/20/17 Labs: 05/07/17 WNL  Okay to refill per Dr. Corliss Skains

## 2017-06-21 ENCOUNTER — Telehealth: Payer: Self-pay | Admitting: Neurology

## 2017-06-21 ENCOUNTER — Other Ambulatory Visit: Payer: Self-pay | Admitting: Neurology

## 2017-06-21 DIAGNOSIS — G47419 Narcolepsy without cataplexy: Secondary | ICD-10-CM

## 2017-06-21 MED ORDER — LISDEXAMFETAMINE DIMESYLATE 70 MG PO CAPS: 70.0000 mg | ORAL_CAPSULE | Freq: Every day | ORAL | 0 refills | Status: DC

## 2017-06-21 NOTE — Telephone Encounter (Signed)
Script will be ready for the patient at the front for pick up

## 2017-06-21 NOTE — Telephone Encounter (Signed)
Patient requesting refill of lisdexamfetamine (VYVANSE) 70 MG capsule.

## 2017-06-30 ENCOUNTER — Other Ambulatory Visit: Payer: Self-pay | Admitting: Rheumatology

## 2017-06-30 NOTE — Telephone Encounter (Signed)
Last Visit: 05/07/17 Next Visit: 09/20/17 Labs: 05/07/17 WNL Tb Gold: 05/07/17 Neg  Okay to refill per Dr. Corliss Skains

## 2017-08-24 ENCOUNTER — Other Ambulatory Visit: Payer: Self-pay | Admitting: Rheumatology

## 2017-08-24 NOTE — Telephone Encounter (Signed)
Last Visit: 05/07/17 Next Visit: 09/20/17  Okay to refill per Dr. Corliss Skains

## 2017-09-16 ENCOUNTER — Ambulatory Visit: Payer: BC Managed Care – PPO | Admitting: Neurology

## 2017-09-20 ENCOUNTER — Ambulatory Visit: Payer: BC Managed Care – PPO | Admitting: Neurology

## 2017-09-20 ENCOUNTER — Telehealth: Payer: Self-pay | Admitting: Neurology

## 2017-09-20 ENCOUNTER — Encounter: Payer: Self-pay | Admitting: Neurology

## 2017-09-20 VITALS — BP 109/74 | HR 104 | Ht 63.0 in | Wt 141.0 lb

## 2017-09-20 DIAGNOSIS — Z79899 Other long term (current) drug therapy: Secondary | ICD-10-CM

## 2017-09-20 DIAGNOSIS — Z79631 Long term (current) use of antimetabolite agent: Secondary | ICD-10-CM | POA: Insufficient documentation

## 2017-09-20 DIAGNOSIS — G47429 Narcolepsy in conditions classified elsewhere without cataplexy: Secondary | ICD-10-CM

## 2017-09-20 DIAGNOSIS — G47419 Narcolepsy without cataplexy: Secondary | ICD-10-CM

## 2017-09-20 MED ORDER — AMPHETAMINE-DEXTROAMPHETAMINE 10 MG PO TABS: 10.0000 mg | ORAL_TABLET | Freq: Every day | ORAL | 0 refills | Status: DC

## 2017-09-20 MED ORDER — LISDEXAMFETAMINE DIMESYLATE 70 MG PO CAPS: 70.0000 mg | ORAL_CAPSULE | Freq: Every day | ORAL | 0 refills | Status: DC

## 2017-09-20 NOTE — Patient Instructions (Signed)
Lisdexamfetamine Oral Capsule What is this medicine? LISDEXAMFETAMINE (lis DEX am fet a meen) is used to treat attention-deficit hyperactivity disorder (ADHD) in adults and children. It is also used to treat binge-eating disorder in adults. Federal law prohibits giving this medicine to any person other than the person for whom it was prescribed. Do not share this medicine with anyone else. This medicine may be used for other purposes; ask your health care provider or pharmacist if you have questions. COMMON BRAND NAME(S): Vyvanse What should I tell my health care provider before I take this medicine? They need to know if you have any of these conditions: -anxiety or panic attacks -circulation problems in fingers and toes -glaucoma -hardening or blockages of the arteries or heart blood vessels -heart disease or a heart defect -high blood pressure -history of a drug or alcohol abuse problem -history of stroke -kidney disease -liver disease -mental illness -seizures -suicidal thoughts, plans, or attempt; a previous suicide attempt by you or a family member -thyroid disease -Tourette's syndrome -an unusual or allergic reaction to lisdexamfetamine, other medicines, foods, dyes, or preservatives -pregnant or trying to get pregnant -breast-feeding How should I use this medicine? Take this medicine by mouth. Follow the directions on the prescription label. Swallow the capsules with a drink of water. You may open capsule and add to a glass of water, then drink right away. Take your doses at regular intervals. Do not take your medicine more often than directed. Do not suddenly stop your medicine. You must gradually reduce the dose or you may feel withdrawal effects. Ask your doctor or health care professional for advice. A special MedGuide will be given to you by the pharmacist with each prescription and refill. Be sure to read this information carefully each time. Talk to your pediatrician  regarding the use of this medicine in children. While this drug may be prescribed for children as young as 6 years of age for selected conditions, precautions do apply. Overdosage: If you think you have taken too much of this medicine contact a poison control center or emergency room at once. NOTE: This medicine is only for you. Do not share this medicine with others. What if I miss a dose? If you miss a dose, take it as soon as you can. If it is almost time for your next dose, take only that dose. Do not take double or extra doses. What may interact with this medicine? Do not take this medicine with any of the following medications: -MAOIs like Carbex, Eldepryl, Marplan, Nardil, and Parnate -other stimulant medicines for attention disorders, weight loss, or to stay awake This medicine may also interact with the following medications: -acetazolamide -ammonium chloride -antacids -ascorbic acid -atomoxetine -caffeine -certain medicines for blood pressure -certain medicines for depression, anxiety, or psychotic disturbances -certain medicines for seizures like carbamazepine, phenobarbital, phenytoin -certain medicines for stomach problems like cimetidine, famotidine, omeprazole, lansoprazole -cold or allergy medicines -green tea -levodopa -linezolid -medicines for sleep during surgery -methenamine -norepinephrine -phenothiazines like chlorpromazine, mesoridazine, prochlorperazine, thioridazine -propoxyphene -sodium acid phosphate -sodium bicarbonate This list may not describe all possible interactions. Give your health care provider a list of all the medicines, herbs, non-prescription drugs, or dietary supplements you use. Also tell them if you smoke, drink alcohol, or use illegal drugs. Some items may interact with your medicine. What should I watch for while using this medicine? Visit your doctor for regular check ups. This prescription requires that you follow special procedures with  your doctor and pharmacy.   You will need to have a new written prescription from your doctor every time you need a refill. This medicine may affect your concentration, or hide signs of tiredness. Until you know how this medicine affects you, do not drive, ride a bicycle, use machinery, or do anything that needs mental alertness. Tell your doctor or health care professional if this medicine loses its effects, or if you feel you need to take more than the prescribed amount. Do not change your dose without talking to your doctor or health care professional. Decreased appetite is a common side effect when starting this medicine. Eating small, frequent meals or snacks can help. Talk to your doctor if you continue to have poor eating habits. Height and weight growth of a child taking this medicine will be monitored closely. Do not take this medicine close to bedtime. It may prevent you from sleeping. If you are going to need surgery, a MRI, CT scan, or other procedure, tell your doctor that you are taking this medicine. You may need to stop taking this medicine before the procedure. Tell your doctor or healthcare professional right away if you notice unexplained wounds on your fingers and toes while taking this medicine. You should also tell your healthcare provider if you experience numbness or pain, changes in the skin color, or sensitivity to temperature in your fingers or toes. What side effects may I notice from receiving this medicine? Side effects that you should report to your doctor or health care professional as soon as possible: -allergic reactions like skin rash, itching or hives, swelling of the face, lips, or tongue -changes in vision -chest pain or chest tightness -confusion, trouble speaking or understanding -fast, irregular heartbeat -fingers or toes feel numb, cool, painful -hallucination, loss of contact with reality -high blood pressure -males: prolonged or painful  erection -seizures -severe headaches -shortness of breath -suicidal thoughts or other mood changes -trouble walking, dizziness, loss of balance or coordination -uncontrollable head, mouth, neck, arm, or leg movements Side effects that usually do not require medical attention (report to your doctor or health care professional if they continue or are bothersome): -anxious -headache -loss of appetite -nausea, vomiting -trouble sleeping -weight loss This list may not describe all possible side effects. Call your doctor for medical advice about side effects. You may report side effects to FDA at 1-800-FDA-1088. Where should I keep my medicine? Keep out of the reach of children. This medicine can be abused. Keep your medicine in a safe place to protect it from theft. Do not share this medicine with anyone. Selling or giving away this medicine is dangerous and against the law. Store at room temperature between 15 and 30 degrees C (59 and 86 degrees F). Protect from light. Keep container tightly closed. Throw away any unused medicine after the expiration date. NOTE: This sheet is a summary. It may not cover all possible information. If you have questions about this medicine, talk to your doctor, pharmacist, or health care provider.  2018 Elsevier/Gold Standard (2014-07-25 19:20:14)  

## 2017-09-20 NOTE — Addendum Note (Signed)
Addended by: Melvyn Novas on: 09/20/2017 10:27 AM   Modules accepted: Orders

## 2017-09-20 NOTE — Progress Notes (Signed)
[facility]  Provider:  Melvyn Novas, M D  Referring Provider: [name], MD Primary Care Physician:  [name], MD  Chief Complaint  Patient presents with  . Follow-up    pt alone, rm 11. pt states things have been going well.     HPI:  Kathleen Alvarez is a 51 y.o. female and  een here as a revisit for medication refill in the treatment of narcolepsy.    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  She had this condition since college years and was first seen in this practice in Feb 01, 2006. For the treatment of for hypersomnia she was started on Provigil but within 6 months she had noted some irritability and anger on that medication, this in addition was more complicated as the patient became pregnant.  Provigil was therefore discontinued and she was started on Adderall , Tolerated much better for about a year she was eventually changed to Vyvanse in 02/02/2007 . In April 30, 2016she was diagnosed with rheumatoid arthritis, takes MTX. this condition deepens her fatigue further.  She continues to do well on Vyvanse after over 7 years. She still sleeps 10-12 hours over a 24 hour period. She is not snoring. No apnea.   Interval history from 09/15/2016. I have the pleasure of seeing Dr. [name], PhD, for routine revisit. The patient introduced to tolerate Vyvanse which she will have taken for 10 years next year. Refills today. No evidence of hypertension, hypersomnia, hyperglycemia.  Interval history from 20 September 2017, I have the pleasure of seeing Dr. 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  Her degree as an exercise physiology. She endorsed the Epworth sleepiness scale today still at 17 points without medicine and under the influence of Vyvanse around 11 points. We continue Vyvanse.  She has been  on the medication now for 10 years. She has been sleepy all her adult life, since college, perhaps late years of high school.     Review of Systems: Out of a complete 14 system review, the patient complains of only the following symptoms, and all other reviewed systems are negative.  environmental allergies / daytime sleepiness and sleep talking.  She also underwent a breast reduction surgery in December 2013 at her last surgical procedure.  She has arthritis on Humira, an auto immune complex disease.    The patient social history: She is employed at [employer] and [employer], teaching  physiology/ exercise sciences.  She is married with 2 children  No hisory of drugs she drinks about one cup of coffee a day.  Family History : Patient's father died in 02-Feb-2012 he had survived initial treatment for colon cancer but died of liver cancer or metastasis. He also was a diabetic. The patient's mother has high cholesterol, is 69 years old  Maternal aunt was diagnosed with lung cancer after a remote smoking history . Age 59. Maternal uncle has sleep apnea but narcolepsy or narcolepsy like hypersomnia is not known in the family.  Brother has gout/   ROS sleep related :  Epworth sleepiness score (on Vyvanse) was endorsed at 11points,  Without 17 points.  FSS 34- on medication.   Social History   Socioeconomic History  . Marital status: Married    Spouse name: Kathleen Alvarez   . Number of children: 2  .  Years of education: 12+  . Highest education level: Not on file  Social Needs  . Financial resource strain: Not on file  . Food insecurity - worry: Not on file  . Food insecurity - inability: Not on file  . Transportation needs - medical: Not on file  . Transportation needs - non-medical: Not on file  Occupational History  . Occupation: Photographer: [employer]  Tobacco Use  . Smoking status: Never Smoker  . Smokeless tobacco: Never Used  Substance and Sexual Activity  . Alcohol use: No   . Drug use: No  . Sexual activity: Not on file  Other Topics Concern  . Not on file  Social History Narrative   Patient is employed.    Patient married with 2 children.       Family History  Problem Relation Age of Onset  . Colon cancer Father        deceased  . Liver cancer Father 55       deceased     Past Medical History:  Diagnosis Date  . Anxiety   . 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 CESAREAN SECTION     x2  . COLONOSCOPY  2009   family history of colon ca  . DILITATION & CURRETTAGE/HYSTROSCOPY WITH NOVASURE ABLATION  08/26/2012   Procedure: DILATATION & CURETTAGE/HYSTEROSCOPY WITH NOVASURE ABLATION;  Surgeon: Lenoard Aden, MD;  Location: [facility];  Service: Gynecology;  Laterality: N/A;  . lipoma     . WISDOM TOOTH EXTRACTION      Current Outpatient Medications  Medication Sig Dispense Refill  . albuterol (PROVENTIL HFA;VENTOLIN HFA) 108 (90 BASE) MCG/ACT inhaler Inhale 2 puffs into the lungs every 6 (six) hours as needed. For exercise-induced bronchospasm    . amphetamine-dextroamphetamine (ADDERALL) 10 MG tablet Take 1 tablet (10 mg total) by mouth daily with breakfast. 30 tablet 0  . cholecalciferol (VITAMIN D) 1000 UNITS tablet Take 1,000 Units by mouth daily.    . folic acid (FOLVITE) 1 MG tablet TAKE 2 TABLETS BY MOUTH EVERY DAY 180 tablet 2  . HUMIRA PEN 40 MG/0.8ML PNKT INJECT ONE PEN (40 MG) SUBCUTANEOUSLY EVERY OTHER WEEK. REFRIGERATE. 3 each 0  . ibuprofen (ADVIL,MOTRIN) 200 MG tablet Take 800 mg by mouth every 6 (six) hours as needed. For muscle soreness    . lisdexamfetamine (VYVANSE) 70 MG capsule Take 1 capsule (70 mg total) by mouth daily. 90 capsule 0  . methotrexate 50 MG/2ML injection INJECT 0.8 ML EACH WEEK (Patient taking differently: INJECT 0.7 ML EACH WEEK) 10 mL 0  . Multiple Vitamin (MULTIVITAMIN WITH MINERALS) TABS Take 1 tablet by mouth daily.    . TUBERCULIN SYR  1CC/27GX1/2" (B-D TB SYRINGE 1CC/27GX1/2") 27G X 1/2" 1 ML MISC Used as directed once per week 12 each 3   No current facility-administered medications for this visit.     Allergies as of 09/20/2017 - Review Complete 09/20/2017  Allergen Reaction Noted  . Peg [polyethylene glycol] Itching and Nausea And Vomiting 09/19/2014    Vitals: BP 109/74   Pulse (!) 104   Ht 5\' 3"  (1.6 m)   Wt 141 lb (64 kg)   BMI 24.98 kg/m  Last Weight:  Wt Readings from Last 1 Encounters:  09/20/17 141 lb (64 kg)   Last Height:   Ht Readings from Last 1 Encounters:  09/20/17 5\' 3"  (1.6 m)  Physical exam:  General: The patient is awake, alert and appears not in acute distress. The patient is well groomed. Head: Normocephalic, atraumatic. Neck is supple. Mallampati*3, neck circumference: 13.75 ,  Cardiovascular:  Regular rate and rhythm , without  murmurs or carotid bruit, and without distended neck veins. Respiratory: Lungs are clear to auscultation. Skin:  Without evidence of edema, or rash. Trunk: normal posture.  Neurologic exam : The patient is awake and alert, oriented to place and time. Memory subjective described as intact.  There is a normal attention span & concentration ability on medication.  Speech is fluent without  dysarthria, dysphonia or aphasia. Mood and affect are appropriate. No depression reported.   Cranial nerves: Pupils are equal and briskly reactive to light. Funduscopic exam without  evidence of pallor or edema.  Extraocular movements in vertical and horizontal planes intact- without nystagmus. Visual fields by finger perimetry are intact. Facial sensation intact to fine touch. Facial motor strength is symmetric and tongue and uvula move midline.  Deep tendon reflexes: in the  upper and lower extremities are symmetric -  Babinski maneuver response is downgoing. 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 PSG and MSLT , failed adderall for EDS ,modafinil.  2) Fatigue and EDS treated with vyvanse.  3) Patient has Rheumatoid arthritis. Autoimmune narcolepsy ?  Plan:  Treatment plan and additional workup :  Vyvanse refill 70 mg daily.  Adderall prn .   Rv in 12 month    Need 90 days supplies.     Melvyn Novas, MD

## 2017-09-22 ENCOUNTER — Telehealth: Payer: Self-pay | Admitting: Neurology

## 2017-09-22 NOTE — Telephone Encounter (Signed)
PA approved 09/22/2018-09/22/2020

## 2017-09-22 NOTE — Telephone Encounter (Signed)
PA completed for Adderall. KEY: [id number]

## 2017-09-23 ENCOUNTER — Other Ambulatory Visit: Payer: Self-pay | Admitting: *Deleted

## 2017-09-23 MED ORDER — METHOTREXATE SODIUM CHEMO INJECTION 50 MG/2ML: INTRAMUSCULAR | 0 refills | Status: DC

## 2017-09-23 NOTE — Telephone Encounter (Signed)
Last Visit: 05/07/17 Next Visit: 11/02/17 Labs: 05/07/17 WNL  Okay to refill 30 day supply per Dr. Corliss Skains

## 2017-10-07 ENCOUNTER — Other Ambulatory Visit: Payer: Self-pay | Admitting: Rheumatology

## 2017-10-07 NOTE — Telephone Encounter (Signed)
Last Visit: 05/07/17 Next Visit: 11/02/17 Labs: 05/07/17 WNL TB Gold: 05/07/17 Neg  Okay to refill 30 day supply per Dr. Corliss Skains

## 2017-10-13 ENCOUNTER — Other Ambulatory Visit: Payer: Self-pay

## 2017-10-13 DIAGNOSIS — Z79899 Other long term (current) drug therapy: Secondary | ICD-10-CM

## 2017-10-13 LAB — COMPLETE METABOLIC PANEL WITH GFR
AG Ratio: 2 (calc) (ref 1.0–2.5)
ALT: 24 U/L (ref 6–29)
AST: 25 U/L (ref 10–35)
Albumin: 4.3 g/dL (ref 3.6–5.1)
Alkaline phosphatase (APISO): 60 U/L (ref 33–115)
BUN: 17 mg/dL (ref 7–25)
CO2: 29 mmol/L (ref 20–32)
Calcium: 9.8 mg/dL (ref 8.6–10.2)
Chloride: 102 mmol/L (ref 98–110)
Creat: 0.9 mg/dL (ref 0.50–1.10)
GFR, Est African American: 87 mL/min/{1.73_m2} (ref >=60)
GFR, Est Non African American: 75 mL/min/{1.73_m2} (ref >=60)
Globulin: 2.2 g/dL (calc) (ref 1.9–3.7)
Glucose, Bld: 105 mg/dL — ABNORMAL HIGH (ref 65–99)
Potassium: 5.1 mmol/L (ref 3.5–5.3)
Sodium: 139 mmol/L (ref 135–146)
Total Bilirubin: 0.7 mg/dL (ref 0.2–1.2)
Total Protein: 6.5 g/dL (ref 6.1–8.1)

## 2017-10-13 LAB — CBC WITH DIFFERENTIAL/PLATELET
Basophils Absolute: 52 cells/uL (ref 0–200)
Basophils Relative: 0.9 %
Eosinophils Absolute: 17 cells/uL (ref 15–500)
Eosinophils Relative: 0.3 %
HCT: 42.6 % (ref 35.0–45.0)
Hemoglobin: 14.5 g/dL (ref 11.7–15.5)
Lymphs Abs: 1218 cells/uL (ref 850–3900)
MCH: 31.3 pg (ref 27.0–33.0)
MCHC: 34 g/dL (ref 32.0–36.0)
MCV: 91.8 fL (ref 80.0–100.0)
MPV: 11.6 fL (ref 7.5–12.5)
Monocytes Relative: 8.8 %
Neutro Abs: 4002 cells/uL (ref 1500–7800)
Neutrophils Relative %: 69 %
Platelets: 235 10*3/uL (ref 140–400)
RBC: 4.64 10*6/uL (ref 3.80–5.10)
RDW: 12.4 % (ref 11.0–15.0)
Total Lymphocyte: 21 %
WBC mixed population: 510 cells/uL (ref 200–950)
WBC: 5.8 10*3/uL (ref 3.8–10.8)

## 2017-10-19 NOTE — Progress Notes (Deleted)
Office Visit Note  Patient: Kathleen Alvarez             Date of Birth: [birth year]           MRN: [id number]             PCP: [name], MD Referring: [name], MD Visit Date: 11/02/2017 Occupation: @GUAROCC @    Subjective:  No chief complaint on file.   History of Present Illness: Kathleen Alvarez is a 52 y.o. female ***   Activities of Daily Living:  Patient reports morning stiffness for *** {minute/hour:19697}.   Patient {ACTIONS;DENIES/REPORTS:21021675::"Denies"} nocturnal pain.  Difficulty dressing/grooming: {ACTIONS;DENIES/REPORTS:21021675::"Denies"} Difficulty climbing stairs: {ACTIONS;DENIES/REPORTS:21021675::"Denies"} Difficulty getting out of chair: {ACTIONS;DENIES/REPORTS:21021675::"Denies"} Difficulty using hands for taps, buttons, cutlery, and/or writing: {ACTIONS;DENIES/REPORTS:21021675::"Denies"}   No Rheumatology ROS completed.   PMFS History:  Patient Active Problem List   Diagnosis Date Noted  . On methotrexate therapy 09/20/2017  . Rheumatoid arthritis involving multiple sites with positive rheumatoid factor (HCC) 05/03/2017  . Narcolepsy 09/15/2016  . 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 Anxiety   . 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 Colon cancer Father        deceased  . Liver cancer Father 47       deceased    Past Surgical History:  Procedure Laterality Date  . CESAREAN SECTION     x2  . COLONOSCOPY  2009   family history of colon ca  . DILITATION & CURRETTAGE/HYSTROSCOPY WITH NOVASURE ABLATION  08/26/2012   Procedure: DILATATION & CURETTAGE/HYSTEROSCOPY WITH NOVASURE ABLATION;  Surgeon: 08/28/2012, MD;  Location: [facility];  Service: Gynecology;  Laterality: N/A;  . lipoma     . WISDOM TOOTH EXTRACTION     Social  History   Social History Narrative   Patient is employed.    Patient married with 2 children.        Objective: Vital Signs: There were no vitals taken for this visit.   Physical Exam   Musculoskeletal Exam: ***  CDAI Exam: No CDAI exam completed.    Investigation: No additional findings.TB Gold: 05/07/2017 CBC Latest Ref Rng & Units 10/13/2017 05/07/2017 02/26/2017  WBC 3.8 - 10.8 Thousand/uL 5.8 8.7 5.7  Hemoglobin 11.7 - 15.5 g/dL 02/28/2017 17.6 16.0  Hematocrit 35.0 - 45.0 % 42.6 45.1(H) 43.6  Platelets 140 - 400 Thousand/uL 235 294 289   CMP Latest Ref Rng & Units 10/13/2017 05/07/2017 02/26/2017  Glucose 65 - 99 mg/dL 02/28/2017) 86 86  BUN 7 - 25 mg/dL 17 21 17   Creatinine 0.50 - 1.10 mg/dL 106(Y 6.94  Sodium 135 - 146 mmol/L 139 139 139  Potassium 3.5 - 5.3 mmol/L 5.1 4.6 5.2  Chloride 98 - 110 mmol/L 102 105 102  CO2 20 - 32 mmol/L 29 26 26   Calcium 8.6 - 10.2 mg/dL 9.8 9.7 9.7  Total Protein 6.1 - 8.1 g/dL 6.5 6.7 6.3  Total Bilirubin 0.2 - 1.2 mg/dL 0.7 0.5 0.9  Alkaline Phos 33 - 115 U/L - 65 62  AST 10 - 35 U/L 25 23 26   ALT 6 - 29 U/L 24 21 24     Imaging: No results found.  Speciality Comments: No specialty comments available.    Procedures:  No procedures performed Allergies: Peg [polyethylene glycol]   Assessment / Plan:  Visit Diagnoses: No diagnosis found.    Orders: No orders of the defined types were placed in this encounter.  No orders of the defined types were placed in this encounter.   Face-to-face time spent with patient was *** minutes. 50% of time was spent in counseling and coordination of care.  Follow-Up Instructions: No Follow-up on file.   Earnestine Mealing, CMA  Note - This record has been created using Editor, commissioning.  Chart creation errors have been sought, but may not always  have been located. Such creation errors do not reflect on  the standard of medical care.

## 2017-10-26 ENCOUNTER — Ambulatory Visit: Payer: BC Managed Care – PPO | Admitting: Rheumatology

## 2017-10-26 ENCOUNTER — Ambulatory Visit (INDEPENDENT_AMBULATORY_CARE_PROVIDER_SITE_OTHER): Payer: Self-pay

## 2017-10-26 ENCOUNTER — Encounter: Payer: Self-pay | Admitting: Rheumatology

## 2017-10-26 VITALS — BP 100/67 | HR 86 | Resp 16 | Ht 63.0 in | Wt 140.0 lb

## 2017-10-26 DIAGNOSIS — Z79899 Other long term (current) drug therapy: Secondary | ICD-10-CM

## 2017-10-26 DIAGNOSIS — F900 Attention-deficit hyperactivity disorder, predominantly inattentive type: Secondary | ICD-10-CM | POA: Diagnosis not present

## 2017-10-26 DIAGNOSIS — M0579 Rheumatoid arthritis with rheumatoid factor of multiple sites without organ or systems involvement: Secondary | ICD-10-CM | POA: Diagnosis not present

## 2017-10-26 DIAGNOSIS — G47429 Narcolepsy in conditions classified elsewhere without cataplexy: Secondary | ICD-10-CM | POA: Diagnosis not present

## 2017-10-26 NOTE — Progress Notes (Signed)
Office Visit Note  Patient: Kathleen Alvarez             Date of Birth: May 28, 1966           MRN: [id number]             PCP: [name], MD Referring: [name], MD Visit Date: 10/26/2017 Occupation: @GUAROCC @    Subjective:  Medication management   History of Present Illness: Kathleen Alvarez is a 52 y.o. female with history of sero positive rheumatoid arthritis. She states she has noticed some knots on her right hand on the third and fourth fingers. She denies any joint swelling. She's been tolerating medication well without any side effects. None of the joints are painful.  Activities of Daily Living:  Patient reports morning stiffness for 0 minutes.   Patient Denies nocturnal pain.  Difficulty dressing/grooming: Denies Difficulty climbing stairs: Denies Difficulty getting out of chair: Denies Difficulty using hands for taps, buttons, cutlery, and/or writing: Denies   Review of Systems  Constitutional: Negative for fatigue, night sweats, weight gain, weight loss and weakness.  HENT: Negative for mouth sores, trouble swallowing, trouble swallowing, mouth dryness and nose dryness.   Eyes: Negative for pain, redness, visual disturbance and dryness.  Respiratory: Negative for cough, shortness of breath and difficulty breathing.   Cardiovascular: Negative for chest pain, palpitations, hypertension, irregular heartbeat and swelling in legs/feet.  Gastrointestinal: Negative for blood in stool, constipation and diarrhea.  Endocrine: Negative for increased urination.  Genitourinary: Negative for vaginal dryness.  Musculoskeletal: Negative for arthralgias, joint pain, joint swelling, myalgias, muscle weakness, morning stiffness, muscle tenderness and myalgias.  Skin: Negative for color change, rash, hair loss, skin tightness, ulcers and sensitivity to sunlight.  Allergic/Immunologic: Negative for susceptible to infections.  Neurological: Negative for dizziness, memory loss and  night sweats.  Hematological: Negative for swollen glands.  Psychiatric/Behavioral: Negative for depressed mood and sleep disturbance. The patient is nervous/anxious.     PMFS History:  Patient Active Problem List   Diagnosis Date Noted  . On methotrexate therapy 09/20/2017  . Rheumatoid arthritis involving multiple sites with positive rheumatoid factor (HCC) 05/03/2017  . Narcolepsy 09/15/2016  . 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 Anxiety   . 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 Colon cancer Father        deceased  . Liver cancer Father 55       deceased    Past Surgical History:  Procedure Laterality Date  . CESAREAN SECTION     x2  . COLONOSCOPY  2009   family history of colon ca  . DILITATION & CURRETTAGE/HYSTROSCOPY WITH NOVASURE ABLATION  08/26/2012   Procedure: DILATATION & CURETTAGE/HYSTEROSCOPY WITH NOVASURE ABLATION;  Surgeon: 08/28/2012, MD;  Location: [facility];  Service: Gynecology;  Laterality: N/A;  . lipoma     . WISDOM TOOTH EXTRACTION     Social History   Social History Narrative   Patient is employed.    Patient married with 2 children.        Objective: Vital Signs: BP 100/67 (BP Location: Left Arm, Patient Position: Sitting, Cuff Size: Normal)   Pulse 86   Resp 16   Ht 5\' 3"  (1.6 m)   Wt 140 lb (63.5 kg)   BMI 24.80 kg/m    Physical Exam  Constitutional:  She is oriented to person, place, and time. She appears well-developed and well-nourished.  HENT:  Head: Normocephalic and atraumatic.  Eyes: Conjunctivae and EOM are normal.  Neck: Normal range of motion.  Cardiovascular: Normal rate, regular rhythm, normal heart sounds and intact distal pulses.  Pulmonary/Chest: Effort normal and breath sounds normal.  Abdominal: Soft.  Bowel sounds are normal.  Lymphadenopathy:    She has no cervical adenopathy.  Neurological: She is alert and oriented to person, place, and time.  Skin: Skin is warm and dry. Capillary refill takes less than 2 seconds.  She is some tender erythematous nodules on her second, third and fourth fingers  Psychiatric: She has a normal mood and affect. Her behavior is normal.  Nursing note and vitals reviewed.    Musculoskeletal Exam: C-spine and thoracic lumbar spine good range of motion. Shoulder joints elbow joints wrist joint MCPs PIPs DIPs are good range of motion with no synovitis. Hip joints knee joints ankles MTPs PIPs DIPs with good range of motion with no synovitis.  CDAI Exam: No CDAI exam completed.    Investigation: No additional findings.TB Gold: 05/07/2017 Negative  CBC Latest Ref Rng & Units 10/13/2017 05/07/2017 02/26/2017  WBC 3.8 - 10.8 Thousand/uL 5.8 8.7 5.7  Hemoglobin 11.7 - 15.5 g/dL 52.7 78.2 42.3  Hematocrit 35.0 - 45.0 % 42.6 45.1(H) 43.6  Platelets 140 - 400 Thousand/uL 235 294 289   CMP Latest Ref Rng & Units 10/13/2017 05/07/2017 02/26/2017  Glucose 65 - 99 mg/dL 536(R) 86 86  BUN 7 - 25 mg/dL 17 21 17   Creatinine 0.50 - 1.10 mg/dL 4.43 1.54 0.08  Sodium 135 - 146 mmol/L 139 139 139  Potassium 3.5 - 5.3 mmol/L 5.1 4.6 5.2  Chloride 98 - 110 mmol/L 102 105 102  CO2 20 - 32 mmol/L 29 26 26   Calcium 8.6 - 10.2 mg/dL 9.8 9.7 9.7  Total Protein 6.1 - 8.1 g/dL 6.5 6.7 6.3  Total Bilirubin 0.2 - 1.2 mg/dL 0.7 0.5 0.9  Alkaline Phos 33 - 115 U/L - 65 62  AST 10 - 35 U/L 25 23 26   ALT 6 - 29 U/L 24 21 24     Imaging: Xr Foot 2 Views Left  Result Date: 10/26/2017 First MTP narrowing was noted. PIP/DIP narrowing was noted. None of the other MTP showed narrowing or erosive changes. No intertarsal joint space narrowing was noted. A small calcaneal spur was noted. Some dorsal spurring was noted. Impression: These findings are consistent with osteoarthritis of the foot.  Xr  Foot 2 Views Right  Result Date: 10/26/2017 PIP/DIP narrowing was noted. No significant MTP joint narrowing or erosive changes were noted. No intertarsal joint space narrowing was noted. A small posterior calcaneal spur was noted. Impression: These findings are consistent with osteoarthritis of the foot.   Xr Hand 2 View Left  Result Date: 10/26/2017 No MCP  narrowing was noted. Minimal PIP narrowing was noted. No erosive changes were noted. No intercarpal or radiocarpal joint space narrowing was noted. Juxta articular osteopenia was noted. 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   Xr Hand 2 View Right  Result Date: 10/26/2017 No MCP  narrowing was noted. Minimal PIP narrowing was noted. No erosive changes were noted. No intercarpal or radiocarpal joint space narrowing was noted. Juxta articular osteopenia was noted. 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    Speciality Comments: No specialty comments available.    Procedures:  No  procedures performed Allergies: Peg [polyethylene glycol]   Assessment / Plan:     Visit Diagnoses: Rheumatoid arthritis involving multiple sites with positive rheumatoid factor (HCC) - Positive RF, positive anti-CCP. She states she has no joint swelling. She has no synovitis on examination. She has some nodules present on her hands I'm uncertain if there are rheumatoid nodules. I've advised her to see a dermatologist to get is nodule biopsy for evaluation. She will schedule her appointment. - Plan: XR Hand 2 View Right, XR Hand 2 View Left, XR Foot 2 Views Right, XR Foot 2 Views Left. No radiographic progression was noted.  High risk medication use - Humira 40mg  qo wk, MTX 0.7 ml sq qwk, folic acid 2mg  po qd. Her labs are stable we'll continue to monitor her labs every 3 months. I discussed possibly increasing the interval between the Humira injections  but she does not change it at this point. Natural anti-inflammatories were discussed that she's been taking anti-inflammatories on when necessary basis.  Attention deficit hyperactivity disorder (ADHD), predominantly inattentive type  Narcolepsy due to underlying condition without cataplexy  Association of heart disease with rheumatoid arthritis was discussed. Need to monitor blood pressure, cholesterol, and to exercise 30-60 minutes on daily basis was discussed. Poor dental hygiene can be a predisposing factor for rheumatoid arthritis. Good dental hygiene was discussed.  Orders: Orders Placed This Encounter  Procedures  . XR Hand 2 View Right  . XR Hand 2 View Left  . XR Foot 2 Views Right  . XR Foot 2 Views Left   No orders of the defined types were placed in this encounter. Association of heart disease with rheumatoid arthritis was discussed. Need to monitor blood pressure, cholesterol, and to exercise 30-60 minutes on daily basis was discussed. Poor dental hygiene can be a predisposing factor for rheumatoid arthritis. Good dental hygiene was discussed.  Face-to-face time spent with patient was 30 minutes. Greater than 50% of time was spent in counseling and coordination of care.  Follow-Up Instructions: Return in about 5 months (around 03/26/2018) for Rheumatoid arthritis.   , MD  Note - This record has been created using 03/28/2018.  Chart creation errors have been sought, but may not always  have been located. Such creation errors do not reflect on  the standard of medical care.

## 2017-10-26 NOTE — Patient Instructions (Addendum)
Standing Labs We placed an order today for your standing lab work.    Please come back and get your standing labs in April and every 3 months  We have open lab Monday through Friday from 8:30-11:30 AM and 1:30-4 PM at the office of Dr. Pollyann Savoy.   The office is located at [address], [place], [place] [address] No appointment is necessary.   Labs are drawn by [facility].  You may receive a bill from [facility] for your lab work. If you have any questions regarding directions or hours of operation,  please call [phone].     Natural anti-inflammatories  You can purchase these at [employer], [employer] or online.  . Turmeric (capsules)  . Ginger (ginger root or capsules)  . Omega 3 (Fish, flax seeds, chia seeds, walnuts, almonds)  . Tart cherry (dried or extract)   Patient should be under the care of a physician while taking these supplements. This may not be reproduced without the permission of Dr. Pollyann Savoy.   Association of heart disease with rheumatoid arthritis was discussed. Need to monitor blood pressure, cholesterol, and to exercise 30-60 minutes on daily basis was discussed. Poor dental hygiene can be a predisposing factor for rheumatoid arthritis. Good dental hygiene was discussed.

## 2017-10-28 ENCOUNTER — Other Ambulatory Visit: Payer: Self-pay | Admitting: *Deleted

## 2017-10-28 MED ORDER — METHOTREXATE SODIUM CHEMO INJECTION 50 MG/2ML: INTRAMUSCULAR | 0 refills | Status: DC

## 2017-10-28 NOTE — Telephone Encounter (Signed)
Refill request received via fax  Last Visit: 10/26/17 Next Visit: 04/20/18 Labs: 10/13/17 WNL  Okay to refill per Dr. Corliss Skains

## 2017-11-02 ENCOUNTER — Ambulatory Visit: Payer: BC Managed Care – PPO | Admitting: Rheumatology

## 2017-11-04 ENCOUNTER — Other Ambulatory Visit: Payer: Self-pay | Admitting: Rheumatology

## 2017-11-04 NOTE — Telephone Encounter (Signed)
Last Visit: 10/26/17 Next Visit: 04/20/18 Labs: 10/13/17 WNL TB Gold: 05/17/17 Neg   Okay to refill per Dr. Corliss Skains

## 2017-11-11 ENCOUNTER — Telehealth: Payer: Self-pay | Admitting: Rheumatology

## 2017-11-11 NOTE — Telephone Encounter (Signed)
Patient called requesting a prescription refill of Diflucan to be [facility] into [facility] [place].  Patient also wanted to let Dr. Corliss Skains know that she went to the dermatologist and saw Dr. Donzetta Starch who "is not 100% sure" but thinks the spots on her hands is [place]ern[place].

## 2017-11-12 MED ORDER — FLUCONAZOLE 150 MG PO TABS: ORAL_TABLET | ORAL | 2 refills | Status: DC

## 2017-11-12 NOTE — Telephone Encounter (Signed)
Ok to give patient a refill of Diflucan.

## 2017-11-16 ENCOUNTER — Other Ambulatory Visit: Payer: Self-pay | Admitting: *Deleted

## 2017-11-16 MED ORDER — "TUBERCULIN SYRINGE 27G X 1/2"" 1 ML MISC": 3 refills | Status: DC

## 2017-11-16 NOTE — Telephone Encounter (Signed)
Refill request received via fax  Last Visit: 10/26/17 Next Visit: 04/20/18  Okay to refill per Dr. Corliss Skains

## 2017-12-20 ENCOUNTER — Telehealth: Payer: Self-pay | Admitting: Neurology

## 2017-12-20 ENCOUNTER — Other Ambulatory Visit: Payer: Self-pay | Admitting: Neurology

## 2017-12-20 DIAGNOSIS — G47419 Narcolepsy without cataplexy: Secondary | ICD-10-CM

## 2017-12-20 MED ORDER — LISDEXAMFETAMINE DIMESYLATE 70 MG PO CAPS: 70.0000 mg | ORAL_CAPSULE | Freq: Every day | ORAL | 0 refills | Status: DC

## 2017-12-20 NOTE — Telephone Encounter (Signed)
Called patient and verified the pharmacy on file and made her aware that we were trying something new with hopefully being able to e fax her script. Will send to Dr Vickey Huger to fill. I have informed her of that if there are any problems I would call her

## 2017-12-20 NOTE — Telephone Encounter (Signed)
Pt calling stating she is need of a refill prescription for lisdexamfetamine (VYVANSE) 70 MG capsule

## 2018-01-19 ENCOUNTER — Other Ambulatory Visit: Payer: Self-pay | Admitting: Rheumatology

## 2018-01-19 NOTE — Telephone Encounter (Signed)
Last Visit: 10/26/17 Next Visit: 04/20/18 Labs: 10/13/17 WNL  Left message to advise patient she is due for labs.   Okay to refill 30 day supply per Dr. Corliss Skains

## 2018-01-26 ENCOUNTER — Other Ambulatory Visit: Payer: Self-pay | Admitting: *Deleted

## 2018-01-26 DIAGNOSIS — Z79899 Other long term (current) drug therapy: Secondary | ICD-10-CM

## 2018-01-26 LAB — CBC WITH DIFFERENTIAL/PLATELET
Basophils Absolute: 62 cells/uL (ref 0–200)
Basophils Relative: 1.5 %
Eosinophils Absolute: 62 cells/uL (ref 15–500)
Eosinophils Relative: 1.5 %
HCT: 41.6 % (ref 35.0–45.0)
Hemoglobin: 14.2 g/dL (ref 11.7–15.5)
Lymphs Abs: 1624 cells/uL (ref 850–3900)
MCH: 31 pg (ref 27.0–33.0)
MCHC: 34.1 g/dL (ref 32.0–36.0)
MCV: 90.8 fL (ref 80.0–100.0)
MPV: 11.3 fL (ref 7.5–12.5)
Monocytes Relative: 10.7 %
Neutro Abs: 1915 cells/uL (ref 1500–7800)
Neutrophils Relative %: 46.7 %
Platelets: 254 10*3/uL (ref 140–400)
RBC: 4.58 10*6/uL (ref 3.80–5.10)
RDW: 12.7 % (ref 11.0–15.0)
Total Lymphocyte: 39.6 %
WBC mixed population: 439 cells/uL (ref 200–950)
WBC: 4.1 10*3/uL (ref 3.8–10.8)

## 2018-01-26 LAB — COMPLETE METABOLIC PANEL WITH GFR
AG Ratio: 2 (calc) (ref 1.0–2.5)
ALT: 118 U/L — ABNORMAL HIGH (ref 6–29)
AST: 75 U/L — ABNORMAL HIGH (ref 10–35)
Albumin: 4.2 g/dL (ref 3.6–5.1)
Alkaline phosphatase (APISO): 70 U/L (ref 33–130)
BUN: 20 mg/dL (ref 7–25)
CO2: 32 mmol/L (ref 20–32)
Calcium: 9.5 mg/dL (ref 8.6–10.4)
Chloride: 105 mmol/L (ref 98–110)
Creat: 0.87 mg/dL (ref 0.50–1.05)
GFR, Est African American: 90 mL/min/{1.73_m2} (ref >=60)
GFR, Est Non African American: 78 mL/min/{1.73_m2} (ref >=60)
Globulin: 2.1 g/dL (calc) (ref 1.9–3.7)
Glucose, Bld: 94 mg/dL (ref 65–99)
Potassium: 4.9 mmol/L (ref 3.5–5.3)
Sodium: 142 mmol/L (ref 135–146)
Total Bilirubin: 0.6 mg/dL (ref 0.2–1.2)
Total Protein: 6.3 g/dL (ref 6.1–8.1)

## 2018-01-26 NOTE — Addendum Note (Signed)
Addended by: Henriette Combs on: 01/26/2018 10:34 AM   Modules accepted: Orders

## 2018-01-27 NOTE — Progress Notes (Signed)
I tried calling patient but could not reach her.  Her LFTs are elevated.  She should discontinue methotrexate.  If she is taking any NSAIDs she should discontinue that as well.  Patient repeat LFTs in 10 days.

## 2018-02-07 ENCOUNTER — Other Ambulatory Visit: Payer: Self-pay

## 2018-02-07 DIAGNOSIS — Z79899 Other long term (current) drug therapy: Secondary | ICD-10-CM

## 2018-02-07 LAB — CBC WITH DIFFERENTIAL/PLATELET
Basophils Absolute: 68 cells/uL (ref 0–200)
Basophils Relative: 1 %
Eosinophils Absolute: 68 cells/uL (ref 15–500)
Eosinophils Relative: 1 %
HCT: 41.5 % (ref 35.0–45.0)
Hemoglobin: 14.3 g/dL (ref 11.7–15.5)
Lymphs Abs: 2346 cells/uL (ref 850–3900)
MCH: 30.8 pg (ref 27.0–33.0)
MCHC: 34.5 g/dL (ref 32.0–36.0)
MCV: 89.4 fL (ref 80.0–100.0)
MPV: 11.8 fL (ref 7.5–12.5)
Monocytes Relative: 9.9 %
Neutro Abs: 3645 cells/uL (ref 1500–7800)
Neutrophils Relative %: 53.6 %
Platelets: 259 10*3/uL (ref 140–400)
RBC: 4.64 10*6/uL (ref 3.80–5.10)
RDW: 12.1 % (ref 11.0–15.0)
Total Lymphocyte: 34.5 %
WBC mixed population: 673 cells/uL (ref 200–950)
WBC: 6.8 10*3/uL (ref 3.8–10.8)

## 2018-02-08 ENCOUNTER — Ambulatory Visit: Payer: BC Managed Care – PPO | Admitting: Sports Medicine

## 2018-02-08 VITALS — BP 104/80 | Ht 63.0 in | Wt 140.0 lb

## 2018-02-08 DIAGNOSIS — S86812A Strain of other muscle(s) and tendon(s) at lower leg level, left leg, initial encounter: Secondary | ICD-10-CM | POA: Diagnosis not present

## 2018-02-08 LAB — COMPLETE METABOLIC PANEL WITH GFR
AG Ratio: 2 (calc) (ref 1.0–2.5)
ALT: 46 U/L — ABNORMAL HIGH (ref 6–29)
AST: 36 U/L — ABNORMAL HIGH (ref 10–35)
Albumin: 4.3 g/dL (ref 3.6–5.1)
Alkaline phosphatase (APISO): 72 U/L (ref 33–130)
BUN: 20 mg/dL (ref 7–25)
CO2: 32 mmol/L (ref 20–32)
Calcium: 9.9 mg/dL (ref 8.6–10.4)
Chloride: 100 mmol/L (ref 98–110)
Creat: 0.89 mg/dL (ref 0.50–1.05)
GFR, Est African American: 88 mL/min/{1.73_m2} (ref >=60)
GFR, Est Non African American: 76 mL/min/{1.73_m2} (ref >=60)
Globulin: 2.1 g/dL (calc) (ref 1.9–3.7)
Glucose, Bld: 113 mg/dL — ABNORMAL HIGH (ref 65–99)
Potassium: 4.9 mmol/L (ref 3.5–5.3)
Sodium: 140 mmol/L (ref 135–146)
Total Bilirubin: 1 mg/dL (ref 0.2–1.2)
Total Protein: 6.4 g/dL (ref 6.1–8.1)

## 2018-02-08 NOTE — Progress Notes (Signed)
   [facility] Phone: [phone]   Date of Visit: 02/08/2018   HPI:  Left Calf Pain:  - patient with history of partial tear of the left (2013) and right (2016) calf.  - she has not been doing long distance running since last summer but does do sprinting. She was doing a 2 mile run this past Friday when she started having a pulling pain in the proximal medial calf about 0.5 miles into the run. This progressively worsened during the run.  - since then she has stopped running. She has been using ice and calf sleeve which does help - the pain usually does not bother her with activities around the house but the did notice some symptoms towards the end of a quarter mile walk.  - she has been using her orthotics with heel lift which was provided to her at a prior visit which was over three years ago.   - she also reports of weakness of her left gluteus muscle for the past few years. She wanted the provider to do a strength test   ROS: See HPI.  PMFSH:  PMH: Rheumatoid Arthritis   PHYSICAL EXAM: BP 104/80   Ht 5\' 3"  (1.6 m)   Wt 140 lb (63.5 kg)   BMI 24.80 kg/m  Gen: NAD Left Calf: Tenderness to palpation along the medial head of the gastrocnemius proximally. no soft tissue swelling or ecchymosis. No tenderness to palpation along the lateral or posterior calf. No pain with plantar flexion and dorsiflexion with resistance.   Left Hip: Negative Trendelenburg  Hip abduction 4/5   Musculoskeletal Ultrasound: Left Calf  Images were obtained in both the transverse and longitudinal positions. hyperechoic change noted in the medial aspect of the gastrocnemius distal to the area that is tender. This is consistent with scar tissue from prior injury. No signs of new tear today.     ASSESSMENT/PLAN:  Left Calf Pain due to Calf Strain:  No signs of tear on ultrasound. Provided new orthotics with heel lifts. Provided eccentric calf muscle exercises. Can continue to use  calf sleeve. Rest for about a week then can start with her usual exercises as well as running with intermittent periods on walking ideally on treadmill (or flat terrain)    Left Gluteus Medius Weakness:  Provided abduction strengthening exercises.   Follow up in 4-5 weeks.   , MD PGY 3 [facility]  Patient seen and evaluated with the resident. I agree with the above plan of care. Patient will start eccentric exercises and she was given new green sports insoles with heel lifts. Resume activity as stated above and follow-up with me in 4-5 weeks. Work on strengthening left gluteus medius in the meantime.

## 2018-02-08 NOTE — Progress Notes (Signed)
LFTs are better but not normal yet . Recheck labs in 1 month

## 2018-02-09 ENCOUNTER — Telehealth: Payer: Self-pay | Admitting: Rheumatology

## 2018-02-09 ENCOUNTER — Encounter: Payer: Self-pay | Admitting: Sports Medicine

## 2018-02-09 ENCOUNTER — Other Ambulatory Visit: Payer: Self-pay

## 2018-02-09 DIAGNOSIS — R7989 Other specified abnormal findings of blood chemistry: Secondary | ICD-10-CM

## 2018-02-09 DIAGNOSIS — R945 Abnormal results of liver function studies: Principal | ICD-10-CM

## 2018-02-09 DIAGNOSIS — R140 Abdominal distension (gaseous): Secondary | ICD-10-CM

## 2018-02-09 DIAGNOSIS — Z79899 Other long term (current) drug therapy: Secondary | ICD-10-CM

## 2018-02-09 NOTE — Telephone Encounter (Signed)
Patient advised of recommendations. Patient states she had moved Humira to every 18 days and is wondering since we had to stop her MTX should she go back to taking Humira every 14 days. Per Dr. Corliss Skains patient advised to take Humira every 14 days. Patient verbalized understanding. Patient will come to the office today for labs. Patient would like a GI referral.  Referral placed.

## 2018-02-09 NOTE — Telephone Encounter (Signed)
Patient states she has been extremely bloated for 2-3 weeks. Patient states she has not been constipated. Patient states she has been cutting out foods that may contribute to this. Patient states she fills she may be retaining fluid. Patient is also having fatigue and itching. Patient states there no rash and it is random.  Please advise.

## 2018-02-09 NOTE — Telephone Encounter (Signed)
Patient called regarding the results of her labwork from 02/07/18.  Patient states she is experiencing abdominal swelling and itching.  Patient is requesting a return call.

## 2018-02-09 NOTE — Telephone Encounter (Signed)
Could not reach patient by phone.  I left a message for her to call back.  I would recommend her to see a GI specialist for evaluation of bloating and elevated LFTs.  She can come in to have repeat LFTs today.

## 2018-02-10 LAB — CBC WITH DIFFERENTIAL/PLATELET
Basophils Absolute: 92 cells/uL (ref 0–200)
Basophils Relative: 1.5 %
Eosinophils Absolute: 79 cells/uL (ref 15–500)
Eosinophils Relative: 1.3 %
HCT: 42.3 % (ref 35.0–45.0)
Hemoglobin: 14.7 g/dL (ref 11.7–15.5)
Lymphs Abs: 2257 cells/uL (ref 850–3900)
MCH: 31.2 pg (ref 27.0–33.0)
MCHC: 34.8 g/dL (ref 32.0–36.0)
MCV: 89.8 fL (ref 80.0–100.0)
MPV: 11.8 fL (ref 7.5–12.5)
Monocytes Relative: 10 %
Neutro Abs: 3062 cells/uL (ref 1500–7800)
Neutrophils Relative %: 50.2 %
Platelets: 274 10*3/uL (ref 140–400)
RBC: 4.71 10*6/uL (ref 3.80–5.10)
RDW: 12.3 % (ref 11.0–15.0)
Total Lymphocyte: 37 %
WBC mixed population: 610 cells/uL (ref 200–950)
WBC: 6.1 10*3/uL (ref 3.8–10.8)

## 2018-02-10 LAB — COMPLETE METABOLIC PANEL WITH GFR
AG Ratio: 2 (calc) (ref 1.0–2.5)
ALT: 41 U/L — ABNORMAL HIGH (ref 6–29)
AST: 32 U/L (ref 10–35)
Albumin: 4.4 g/dL (ref 3.6–5.1)
Alkaline phosphatase (APISO): 77 U/L (ref 33–130)
BUN: 19 mg/dL (ref 7–25)
CO2: 32 mmol/L (ref 20–32)
Calcium: 9.6 mg/dL (ref 8.6–10.4)
Chloride: 102 mmol/L (ref 98–110)
Creat: 0.88 mg/dL (ref 0.50–1.05)
GFR, Est African American: 89 mL/min/{1.73_m2} (ref >=60)
GFR, Est Non African American: 77 mL/min/{1.73_m2} (ref >=60)
Globulin: 2.2 g/dL (calc) (ref 1.9–3.7)
Glucose, Bld: 92 mg/dL (ref 65–99)
Potassium: 4.3 mmol/L (ref 3.5–5.3)
Sodium: 140 mmol/L (ref 135–146)
Total Bilirubin: 0.5 mg/dL (ref 0.2–1.2)
Total Protein: 6.6 g/dL (ref 6.1–8.1)

## 2018-02-10 NOTE — Progress Notes (Signed)
Repeat LFTs are better.  We will recheck in 1 month.

## 2018-02-15 ENCOUNTER — Other Ambulatory Visit: Payer: Self-pay | Admitting: Gastroenterology

## 2018-02-15 DIAGNOSIS — R140 Abdominal distension (gaseous): Secondary | ICD-10-CM

## 2018-02-16 ENCOUNTER — Other Ambulatory Visit: Payer: Self-pay | Admitting: Gastroenterology

## 2018-02-16 DIAGNOSIS — R140 Abdominal distension (gaseous): Secondary | ICD-10-CM

## 2018-02-17 ENCOUNTER — Ambulatory Visit
Admission: RE | Admit: 2018-02-17 | Discharge: 2018-02-17 | Disposition: A | Discharge status: Home / Self Care | Payer: BC Managed Care – PPO | Source: Ambulatory Visit | Attending: Gastroenterology | Admitting: Gastroenterology

## 2018-02-17 DIAGNOSIS — R140 Abdominal distension (gaseous): Secondary | ICD-10-CM

## 2018-03-02 ENCOUNTER — Other Ambulatory Visit: Payer: Self-pay | Admitting: Rheumatology

## 2018-03-02 NOTE — Telephone Encounter (Signed)
Last visit: 10/26/2017 Next visit: 04/20/2018 Labs: 02/09/2018 Repeat LFTs are better. We will recheck in 1 month. TB Gold: 05/07/2017 Negative   Okay to refill per Dr. Corliss Skains.

## 2018-03-08 ENCOUNTER — Other Ambulatory Visit: Payer: Self-pay

## 2018-03-08 DIAGNOSIS — Z79899 Other long term (current) drug therapy: Secondary | ICD-10-CM

## 2018-03-09 LAB — COMPLETE METABOLIC PANEL WITH GFR
AG Ratio: 1.8 (calc) (ref 1.0–2.5)
ALT: 37 U/L — ABNORMAL HIGH (ref 6–29)
AST: 31 U/L (ref 10–35)
Albumin: 4.4 g/dL (ref 3.6–5.1)
Alkaline phosphatase (APISO): 76 U/L (ref 33–130)
BUN: 19 mg/dL (ref 7–25)
CO2: 32 mmol/L (ref 20–32)
Calcium: 10.1 mg/dL (ref 8.6–10.4)
Chloride: 101 mmol/L (ref 98–110)
Creat: 0.88 mg/dL (ref 0.50–1.05)
GFR, Est African American: 89 mL/min/{1.73_m2} (ref >=60)
GFR, Est Non African American: 77 mL/min/{1.73_m2} (ref >=60)
Globulin: 2.5 g/dL (calc) (ref 1.9–3.7)
Glucose, Bld: 147 mg/dL — ABNORMAL HIGH (ref 65–99)
Potassium: 4.6 mmol/L (ref 3.5–5.3)
Sodium: 140 mmol/L (ref 135–146)
Total Bilirubin: 0.7 mg/dL (ref 0.2–1.2)
Total Protein: 6.9 g/dL (ref 6.1–8.1)

## 2018-03-09 LAB — CBC WITH DIFFERENTIAL/PLATELET
Basophils Absolute: 68 cells/uL (ref 0–200)
Basophils Relative: 1 %
Eosinophils Absolute: 82 cells/uL (ref 15–500)
Eosinophils Relative: 1.2 %
HCT: 42.8 % (ref 35.0–45.0)
Hemoglobin: 14.8 g/dL (ref 11.7–15.5)
Lymphs Abs: 2489 cells/uL (ref 850–3900)
MCH: 30.9 pg (ref 27.0–33.0)
MCHC: 34.6 g/dL (ref 32.0–36.0)
MCV: 89.4 fL (ref 80.0–100.0)
MPV: 11.6 fL (ref 7.5–12.5)
Monocytes Relative: 8.7 %
Neutro Abs: 3570 cells/uL (ref 1500–7800)
Neutrophils Relative %: 52.5 %
Platelets: 269 10*3/uL (ref 140–400)
RBC: 4.79 10*6/uL (ref 3.80–5.10)
RDW: 11.7 % (ref 11.0–15.0)
Total Lymphocyte: 36.6 %
WBC mixed population: 592 cells/uL (ref 200–950)
WBC: 6.8 10*3/uL (ref 3.8–10.8)

## 2018-03-09 NOTE — Progress Notes (Signed)
Her LFTs are better but not completely normal.  If she is clinically doing well I would hold off methotrexate.

## 2018-03-15 ENCOUNTER — Ambulatory Visit: Payer: BC Managed Care – PPO | Admitting: Sports Medicine

## 2018-03-17 ENCOUNTER — Ambulatory Visit: Payer: BC Managed Care – PPO | Admitting: Sports Medicine

## 2018-03-17 VITALS — BP 104/78 | Ht 63.0 in | Wt 140.0 lb

## 2018-03-17 DIAGNOSIS — S86819A Strain of other muscle(s) and tendon(s) at lower leg level, unspecified leg, initial encounter: Secondary | ICD-10-CM | POA: Insufficient documentation

## 2018-03-17 DIAGNOSIS — S86812D Strain of other muscle(s) and tendon(s) at lower leg level, left leg, subsequent encounter: Secondary | ICD-10-CM

## 2018-03-17 NOTE — Progress Notes (Signed)
Subjective:    Kathleen Alvarez is a 52 y.o. old female here follow-up on left calf strain.  HPI Left calf strain: strained her left calf about 6 weeks ago while running.  She was seen in our office about 5 weeks ago.  She was recommended resting for 1 week and gradual return to exercise as pain allows.  She was also recommended eccentric exercise that she has been doing intermittently.  She was also given a heel lift insert that she has been wearing.  She says she started cross feet exercise, walking and running alternatively.  She says she has run about 2 to 3 miles total last week.  She also did some walking uphill for about 20 to 30-minute last week. Today, she states that her pain is 0 out of 10.  She denies pain with walking and running a walker.  He denies a swelling or tenderness.  Left gluteus medius weakness: Had about 4/5 strength is with left hip abduction about 5 weeks ago.  She was given home exercise regimen that she has been doing.  She reports intermittent cramping at night that resolves with stretching.  She reports doing a lot of squatting.  PMH/Problem List: has 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.00); Attention deficit hyperactivity disorder (ADHD), predominantly inattentive type; Narcolepsy; Rheumatoid arthritis involving multiple sites with positive rheumatoid factor (HCC); On methotrexate therapy; and Strain of calf muscle on their problem list.   has a past medical history of Anxiety and 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.00) (09/19/2014).  FH:  Family History  Problem Relation Age of Onset  . Colon cancer Father        deceased  . Liver cancer Father 30       deceased     SH Social History   Tobacco Use  . Smoking status: Never Smoker  . Smokeless tobacco: Never Used  Substance Use Topics  . Alcohol use: No  . Drug use: No    Review of Systems Review of systems negative except for pertinent positives and negatives in history of present illness above.     Objective:       Vitals:   03/17/18 0932  BP: 104/78  Weight: 140 lb (63.5 kg)  Height: 5\' 3"  (1.6 m)   Body mass index is 24.8 kg/m.  Physical Exam  GEN: appears well & comfortable. No apparent distress. CVS: 2+ radial pulses bilaterally RESP: no IWOB MSK:   Left Lower Extremity Exam:  Appears symmetric to her right lower extremity.  No apparent swelling or skin color change  No tenderness to palpation.  Painless tiptoe and heel walk  Neurovascular intact except for 4+/5 abduction strength in left hip. PSYCH: euthymic mood with congruent affect    Assessment and Plan:  1. Strain of calf muscle, left, subsequent encounter: Resolved.  She has been doing home exercise and wearing heel lift insert.  She is back doing cross feet, exercise, walking and running.  Recommended gradual return to her routine/running with about 10% increment weekly. Start calf stretching exercise after running.  Continue heel lifts insert.  Continue compression sleeve.  Continue eccentric exercise at least 3 days a week.  Follow-up as needed.  2.  Left gluteus medius weakness: improved with home exercise regimen.  She has 4+/5 abduction strength in left hip today.  Reports intermittent cramping in left hip.  Recommend continuing home exercise regimen.  Gave her information for physical therapy.  , MD 03/17/18 Pager: [phone]  Patient seen and evaluated with the resident. I  agree with the above plan of care.Patient is asymptomatic today. She will slowly start to increase her exercise routine and continue wearing her heel lifts when running. Continue home exercises and follow-up as needed.

## 2018-03-21 ENCOUNTER — Other Ambulatory Visit: Payer: Self-pay | Admitting: Neurology

## 2018-03-21 ENCOUNTER — Telehealth: Payer: Self-pay | Admitting: Neurology

## 2018-03-21 DIAGNOSIS — G47419 Narcolepsy without cataplexy: Secondary | ICD-10-CM

## 2018-03-21 MED ORDER — LISDEXAMFETAMINE DIMESYLATE 70 MG PO CAPS: 70.0000 mg | ORAL_CAPSULE | Freq: Every day | ORAL | 0 refills | Status: DC

## 2018-03-21 NOTE — Telephone Encounter (Signed)
Pt requesting a refill for lisdexamfetamine (VYVANSE) 70 MG capsule [facility] [place]

## 2018-03-21 NOTE — Telephone Encounter (Signed)
I have routed this request to Dr Dohmeier for review. The pt is due for the medication and [place] registry was verified.  

## 2018-04-06 NOTE — Progress Notes (Signed)
Office Visit Note  Patient: Kathleen Alvarez             Date of Birth: 1966-08-01           MRN: [id number]             PCP: [name], MD Referring: [name], MD Visit Date: 04/20/2018 Occupation: @GUAROCC @    Subjective:  Joint  stiffness.   History of Present Illness: Kathleen Alvarez is a 52 y.o. female with seropositive rheumatoid arthritis.  She was taken off methotrexate due to elevation of her LFTs.  She has been on Humira monotherapy.  She has not noticed any increased swelling or discomfort.  She does workout on a regular basis.  She has noticed some joint stiffness after workout and some muscle mass.  She denies any joint swelling.  She was recently diagnosed to be postmenopausal.  She has gained some weight and is disappointed about it.  She also complains of insomnia and hot flashes.  Activities of Daily Living:  Patient reports morning stiffness for 0 minute.   Patient Denies nocturnal pain.  Difficulty dressing/grooming: Denies Difficulty climbing stairs: Denies Difficulty getting out of chair: Denies Difficulty using hands for taps, buttons, cutlery, and/or writing: Denies   Review of Systems  Constitutional: Positive for fatigue. Negative for night sweats, weight gain and weight loss.  HENT: Positive for mouth dryness. Negative for mouth sores, trouble swallowing, trouble swallowing and nose dryness.   Eyes: Negative for pain, redness, visual disturbance and dryness.  Respiratory: Negative for cough, shortness of breath and difficulty breathing.   Cardiovascular: Negative for chest pain, palpitations, hypertension, irregular heartbeat and swelling in legs/feet.  Gastrointestinal: Negative for blood in stool, constipation and diarrhea.  Endocrine: Negative for increased urination.  Genitourinary: Negative for vaginal dryness.  Musculoskeletal: Negative for arthralgias, joint pain, joint swelling, myalgias, muscle weakness, morning stiffness, muscle tenderness  and myalgias.  Skin: Negative for color change, rash, hair loss, skin tightness, ulcers and sensitivity to sunlight.  Allergic/Immunologic: Negative for susceptible to infections.  Neurological: Negative for dizziness, memory loss, night sweats and weakness.  Hematological: Negative for swollen glands.  Psychiatric/Behavioral: Positive for sleep disturbance. Negative for depressed mood. The patient is not nervous/anxious.     PMFS History:  Patient Active Problem List   Diagnosis Date Noted  . Strain of calf muscle 03/17/2018  . On methotrexate therapy 09/20/2017  . Rheumatoid arthritis involving multiple sites with positive rheumatoid factor (HCC) 05/03/2017  . Narcolepsy 09/15/2016  . 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 Anxiety   . 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 Colon cancer Father        deceased  . Liver cancer Father 42       deceased    Past Surgical History:  Procedure Laterality Date  . CESAREAN SECTION     x2  . COLONOSCOPY  2009   family history of colon ca  . DILITATION & CURRETTAGE/HYSTROSCOPY WITH NOVASURE ABLATION  08/26/2012   Procedure: DILATATION & CURETTAGE/HYSTEROSCOPY WITH NOVASURE ABLATION;  Surgeon: 08/28/2012, MD;  Location: [facility];  Service: Gynecology;  Laterality: N/A;  . lipoma     . WISDOM TOOTH EXTRACTION     Social History   Social History Narrative   Patient is employed.    Patient married with  2 children.        Objective: Vital Signs: BP 104/72 (BP Location: Left Arm, Patient Position: Sitting, Cuff Size: Normal)   Pulse 88   Resp 12   Ht 5\' 3"  (1.6 m)   Wt 144 lb (65.3 kg)   BMI 25.51 kg/m    Physical Exam  Constitutional: She is oriented to person, place, and time. She appears well-developed and  well-nourished.  HENT:  Head: Normocephalic and atraumatic.  Eyes: Conjunctivae and EOM are normal.  Neck: Normal range of motion.  Cardiovascular: Normal rate, regular rhythm, normal heart sounds and intact distal pulses.  Pulmonary/Chest: Effort normal and breath sounds normal.  Abdominal: Soft. Bowel sounds are normal.  Lymphadenopathy:    She has no cervical adenopathy.  Neurological: She is alert and oriented to person, place, and time.  Skin: Skin is warm and dry. Capillary refill takes less than 2 seconds.  Few hypopigmented patches on her back were noted.  Psychiatric: She has a normal mood and affect. Her behavior is normal.  Nursing note and vitals reviewed.    Musculoskeletal Exam: C-spine thoracic lumbar spine good range of motion.  Shoulder joints elbow joints wrist joint MCPs PIPs DIPs were in good range of motion with no synovitis.  Hip joints knee joints ankles MTPs PIPs DIPs were in good range of motion with no synovitis.  CDAI Exam: CDAI Homunculus Exam:   Joint Counts:  CDAI Tender Joint count: 0 CDAI Swollen Joint count: 0  Global Assessments:  Patient Global Assessment: 2 Provider Global Assessment: 2  CDAI Calculated Score: 4    Investigation: No additional findings.TB Gold: 05/07/2017 Negative  CBC Latest Ref Rng & Units 03/08/2018 02/09/2018 02/07/2018  WBC 3.8 - 10.8 Thousand/uL 6.8 6.1 6.8  Hemoglobin 11.7 - 15.5 g/dL 77.4 12.8 78.6  Hematocrit 35.0 - 45.0 % 42.8 42.3 41.5  Platelets 140 - 400 Thousand/uL 269 274 259   CMP Latest Ref Rng & Units 03/08/2018 02/09/2018 02/07/2018  Glucose 65 - 99 mg/dL 767(M) 92 094(B)  BUN 7 - 25 mg/dL 19 19 20   Creatinine 0.50 - 1.05 mg/dL 0.96 2.83 6.62  Sodium 135 - 146 mmol/L 140 140 140  Potassium 3.5 - 5.3 mmol/L 4.6 4.3 4.9  Chloride 98 - 110 mmol/L 101 102 100  CO2 20 - 32 mmol/L 32 32 32  Calcium 8.6 - 10.4 mg/dL 94.7 9.6 9.9  Total Protein 6.1 - 8.1 g/dL 6.9 6.6 6.4  Total Bilirubin 0.2 - 1.2 mg/dL 0.7 0.5 1.0    Alkaline Phos 33 - 115 U/L - - -  AST 10 - 35 U/L 31 32 36(H)  ALT 6 - 29 U/L 37(H) 41(H) 46(H)    Imaging: No results found.  Speciality Comments: No specialty comments available.    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  She has no synovitis on examination.  She was taken off methotrexate due to elevation of LFTs.  She will continue on Humira every other week for right now.  High risk medication use - Humira 40mg  qo wk - Plan: QuantiFERON-TB Gold Plus in August with her standing orders.  She will need standing orders every 3 months.  Other insomnia-good sleep hygiene and over-the-counter products were discussed.  Tinea versicolor-she has few hypopigmented lesions on her back.  She was treated with Diflucan in  the past.  I have advised her to use Selsun Blue shampoo for right now.  If her lesions get worse we can treat  again.  Postmenopausal-I will schedule DEXA scan.  Tinea versicolor  Attention deficit hyperactivity disorder (ADHD), predominantly inattentive type  Narcolepsy due to underlying condition without cataplexy    Orders: Orders Placed This Encounter  Procedures  . DG BONE DENSITY (DXA)  . QuantiFERON-TB Gold Plus   No orders of the defined types were placed in this encounter.   Face-to-face time spent with patient was 30 minutes. Greater than 50% of time was spent in counseling and coordination of care.  Follow-Up Instructions: Return in about 5 months (around 09/20/2018) for Rheumatoid arthritis.   Pollyann Savoy, MD  Note - This record has been created using Animal nutritionist.  Chart creation errors have been sought, but may not always  have been located. Such creation errors do not reflect on  the standard of medical care.

## 2018-04-20 ENCOUNTER — Ambulatory Visit: Payer: BC Managed Care – PPO | Admitting: Rheumatology

## 2018-04-20 ENCOUNTER — Encounter: Payer: Self-pay | Admitting: Rheumatology

## 2018-04-20 VITALS — BP 104/72 | HR 88 | Resp 12 | Ht 63.0 in | Wt 144.0 lb

## 2018-04-20 DIAGNOSIS — G4709 Other insomnia: Secondary | ICD-10-CM

## 2018-04-20 DIAGNOSIS — M0579 Rheumatoid arthritis with rheumatoid factor of multiple sites without organ or systems involvement: Secondary | ICD-10-CM

## 2018-04-20 DIAGNOSIS — G47429 Narcolepsy in conditions classified elsewhere without cataplexy: Secondary | ICD-10-CM | POA: Diagnosis not present

## 2018-04-20 DIAGNOSIS — F900 Attention-deficit hyperactivity disorder, predominantly inattentive type: Secondary | ICD-10-CM | POA: Diagnosis not present

## 2018-04-20 DIAGNOSIS — Z79899 Other long term (current) drug therapy: Secondary | ICD-10-CM

## 2018-04-20 DIAGNOSIS — Z780 Asymptomatic menopausal state: Secondary | ICD-10-CM

## 2018-04-20 DIAGNOSIS — B360 Pityriasis versicolor: Secondary | ICD-10-CM | POA: Diagnosis not present

## 2018-04-20 NOTE — Patient Instructions (Signed)
Standing Labs We placed an order today for your standing lab work.    Please come back and get your standing labs in August and every 3 months TB gold will be done in August  We have open lab Monday through Friday from 8:30-11:30 AM and 1:30-4:00 PM  at the office of Dr. Pollyann Savoy.   You may experience shorter wait times on Monday and Friday afternoons. The office is located at [address], [place], [place] [address] No appointment is necessary.   Labs are drawn by [facility].  You may receive a bill from [facility] for your lab work. If you have any questions regarding directions or hours of operation,  please call [phone].

## 2018-05-05 ENCOUNTER — Telehealth: Payer: Self-pay | Admitting: Rheumatology

## 2018-05-05 NOTE — Telephone Encounter (Signed)
Attempted to call patient and left message on machine to advise patient to call the office.  

## 2018-05-05 NOTE — Telephone Encounter (Signed)
Patient left a voicemail stating she had her Mammogram in January or February of 2019 when she saw her OB/GYN Dr. Billy Coast.  Patient requested a return call.

## 2018-05-11 ENCOUNTER — Telehealth: Payer: Self-pay | Admitting: Rheumatology

## 2018-05-11 NOTE — Telephone Encounter (Signed)
Patient called stating she was returning your call.   °

## 2018-05-12 NOTE — Telephone Encounter (Signed)
Patient advised that the message she received was for her to call and schedule her bone denisity scan. Patient states she will call to schedule it.

## 2018-05-18 ENCOUNTER — Other Ambulatory Visit: Payer: Self-pay | Admitting: Rheumatology

## 2018-05-18 NOTE — Telephone Encounter (Signed)
Last visit: 04/20/2018 Next visit: 09/22/2018 Labs: 03/08/2018 Her LFTs are better but not completely normal. Tb gold: 05/07/2017 negative   Okay to refill per Dr. Corliss Skains.

## 2018-05-19 ENCOUNTER — Telehealth: Payer: Self-pay

## 2018-05-19 NOTE — Telephone Encounter (Signed)
Patient returned the call and advised of bone density results. Patient verbalized understanding.

## 2018-05-19 NOTE — Telephone Encounter (Signed)
Bone density results:   T-score: 0.7 BMD: 1.033  Attempted to call patient and left message on machine for patient to call the office.

## 2018-06-08 ENCOUNTER — Telehealth: Payer: Self-pay | Admitting: Rheumatology

## 2018-06-08 NOTE — Telephone Encounter (Signed)
Attempted to call patient and left message on machine to advise patient to call the office.  

## 2018-06-08 NOTE — Telephone Encounter (Signed)
Patient left a voicemail requesting a return call with the results of her bone scan.

## 2018-06-09 ENCOUNTER — Other Ambulatory Visit: Payer: Self-pay

## 2018-06-09 DIAGNOSIS — Z79899 Other long term (current) drug therapy: Secondary | ICD-10-CM

## 2018-06-09 DIAGNOSIS — Z780 Asymptomatic menopausal state: Secondary | ICD-10-CM

## 2018-06-09 NOTE — Telephone Encounter (Signed)
Patient called back and is requesting a copy of bone density to be mailed to her home address. It has been mailed.

## 2018-06-09 NOTE — Telephone Encounter (Signed)
Attempted to call patient and left message on machine to advise patient to call the office.  

## 2018-06-10 LAB — COMPLETE METABOLIC PANEL WITH GFR
AG Ratio: 1.8 (calc) (ref 1.0–2.5)
ALT: 24 U/L (ref 6–29)
AST: 28 U/L (ref 10–35)
Albumin: 4.2 g/dL (ref 3.6–5.1)
Alkaline phosphatase (APISO): 63 U/L (ref 33–130)
BUN: 16 mg/dL (ref 7–25)
CO2: 30 mmol/L (ref 20–32)
Calcium: 9.7 mg/dL (ref 8.6–10.4)
Chloride: 104 mmol/L (ref 98–110)
Creat: 0.95 mg/dL (ref 0.50–1.05)
GFR, Est African American: 81 mL/min/{1.73_m2} (ref >=60)
GFR, Est Non African American: 70 mL/min/{1.73_m2} (ref >=60)
Globulin: 2.4 g/dL (calc) (ref 1.9–3.7)
Glucose, Bld: 144 mg/dL — ABNORMAL HIGH (ref 65–99)
Potassium: 4.3 mmol/L (ref 3.5–5.3)
Sodium: 144 mmol/L (ref 135–146)
Total Bilirubin: 0.5 mg/dL (ref 0.2–1.2)
Total Protein: 6.6 g/dL (ref 6.1–8.1)

## 2018-06-10 LAB — CBC WITH DIFFERENTIAL/PLATELET
Basophils Absolute: 50 cells/uL (ref 0–200)
Basophils Relative: 0.8 %
Eosinophils Absolute: 50 cells/uL (ref 15–500)
Eosinophils Relative: 0.8 %
HCT: 42.3 % (ref 35.0–45.0)
Hemoglobin: 14.3 g/dL (ref 11.7–15.5)
Lymphs Abs: 2243 cells/uL (ref 850–3900)
MCH: 29.9 pg (ref 27.0–33.0)
MCHC: 33.8 g/dL (ref 32.0–36.0)
MCV: 88.5 fL (ref 80.0–100.0)
MPV: 11 fL (ref 7.5–12.5)
Monocytes Relative: 6 %
Neutro Abs: 3578 cells/uL (ref 1500–7800)
Neutrophils Relative %: 56.8 %
Platelets: 286 10*3/uL (ref 140–400)
RBC: 4.78 10*6/uL (ref 3.80–5.10)
RDW: 11.5 % (ref 11.0–15.0)
Total Lymphocyte: 35.6 %
WBC mixed population: 378 cells/uL (ref 200–950)
WBC: 6.3 10*3/uL (ref 3.8–10.8)

## 2018-06-11 LAB — QUANTIFERON-TB GOLD PLUS
Mitogen-NIL: >10 IU/mL
NIL: 0.03 IU/mL
QuantiFERON-TB Gold Plus: NEGATIVE
TB1-NIL: 0 IU/mL
TB2-NIL: 0 IU/mL

## 2018-06-17 ENCOUNTER — Telehealth: Payer: Self-pay | Admitting: Pharmacy Technician

## 2018-06-17 NOTE — Telephone Encounter (Signed)
Received a Prior Authorization request from [facility] for Humira. Authorization has been submitted to patient's insurance via Fax. Will update once we receive a response.  Fax# [phone]  8:14 AM Dorthula Nettles, CPhT

## 2018-06-21 ENCOUNTER — Other Ambulatory Visit: Payer: Self-pay | Admitting: Neurology

## 2018-06-21 ENCOUNTER — Telehealth: Payer: Self-pay | Admitting: Neurology

## 2018-06-21 DIAGNOSIS — G47419 Narcolepsy without cataplexy: Secondary | ICD-10-CM

## 2018-06-21 MED ORDER — LISDEXAMFETAMINE DIMESYLATE 70 MG PO CAPS: 70.0000 mg | ORAL_CAPSULE | Freq: Every day | ORAL | 0 refills | Status: DC

## 2018-06-21 NOTE — Telephone Encounter (Signed)
Patient called and requested a refill for rx Vyvanse.

## 2018-06-21 NOTE — Telephone Encounter (Signed)
I have routed this request to Dr Dohmeier for review. The pt is due for the medication and [place] registry was verified.  

## 2018-06-21 NOTE — Telephone Encounter (Signed)
Received a fax from [facility] regarding a prior authorization for Humira. Authorization has been APPROVED from 06/17/18 to 06/17/20.   Will send document to scan center.  Authorization # [id number] Phone # [phone]  8:18 AM Dorthula Nettles, CPhT

## 2018-06-30 ENCOUNTER — Telehealth: Payer: Self-pay | Admitting: Rheumatology

## 2018-06-30 NOTE — Telephone Encounter (Signed)
Patient called stating she had a question about a medication and asked to talk to you directly.

## 2018-06-30 NOTE — Telephone Encounter (Signed)
Spoke with patient and discussed that there is no interaction between Humira and Effexor.  Have advised her to contact her PCP to get the prescription Effexor.

## 2018-06-30 NOTE — Telephone Encounter (Signed)
Patient contacted the office stating she is feeling overwhelmed. She states she has previously been on Effexor and wanted to know if this has any interactions with Humira. According to drugs.com there are no interactions between the medications. Patient states the she has work stress, stress with her kids, effects of menopause, she has gained weight with causes her anxiety. Patient states she does not want to hurt herself or turn to negative ways of dealing with stress. Patient would like to know if you would be willing to prescribe Effexor or if she would need to get prescription for PCP. Please advise.

## 2018-08-04 ENCOUNTER — Other Ambulatory Visit: Payer: Self-pay | Admitting: Rheumatology

## 2018-08-04 NOTE — Telephone Encounter (Signed)
Last visit: 04/20/2018 Next visit: 09/22/2018 Labs: 06/09/18 Glucose is 144. All other labs are WNL. Tb Gold: 06/09/18 Neg   Okay to refill per Dr. Corliss Skains

## 2018-09-09 NOTE — Progress Notes (Signed)
Office Visit Note  Patient: Kathleen Alvarez             Date of Birth: 1965/11/21           MRN: [id number]             PCP: [name], MD Referring: [name], MD Visit Date: 09/22/2018 Occupation: @GUAROCC @  Subjective:  Medication management.  History of Present Illness: Kathleen Alvarez is a 52 y.o. female with seropositive rheumatoid arthritis.  She states she has been doing quite well on Humira every other week currently.  She denies any joint swelling.  She has some stiffness in her joints when she works out.  She has been experiencing some burning from the Humira hand some redness and bruising at the site at times.  Patient states that she had some knots on her fingers for which she was seen by dermatologist and was diagnosed with pernio.  He has been using gloves on regular basis this year.  And has not noticed any recurrence.  Activities of Daily Living:  Patient reports morning stiffness for 0 minutes.   Patient Denies nocturnal pain.  Difficulty dressing/grooming: Denies Difficulty climbing stairs: Denies Difficulty getting out of chair: Denies Difficulty using hands for taps, buttons, cutlery, and/or writing: Denies  Review of Systems  Constitutional: Positive for fatigue.  HENT: Negative for mouth sores, trouble swallowing, trouble swallowing and mouth dryness.   Eyes: Negative for pain, redness, itching and dryness.  Respiratory: Negative for shortness of breath, wheezing and difficulty breathing.   Cardiovascular: Negative for chest pain, palpitations and swelling in legs/feet.  Gastrointestinal: Negative for abdominal pain, blood in stool, constipation, diarrhea, nausea and vomiting.  Endocrine: Negative for increased urination.  Genitourinary: Negative for painful urination, nocturia and pelvic pain.  Musculoskeletal: Negative for arthralgias, joint pain, joint swelling and morning stiffness.  Skin: Positive for hair loss. Negative for rash.    Allergic/Immunologic: Negative for susceptible to infections.  Neurological: Negative for dizziness, light-headedness, headaches, memory loss and weakness.  Hematological: Negative for bruising/bleeding tendency.  Psychiatric/Behavioral: Negative for confusion. The patient is not nervous/anxious.     PMFS History:  Patient Active Problem List   Diagnosis Date Noted  . Strain of calf muscle 03/17/2018  . On methotrexate therapy 09/20/2017  . Rheumatoid arthritis involving multiple sites with positive rheumatoid factor (HCC) 05/03/2017  . Narcolepsy 09/15/2016  . 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 Anxiety   . 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 Colon cancer Father        deceased  . Liver cancer Father 11       deceased    Past Surgical History:  Procedure Laterality Date  . CESAREAN SECTION     x2  . COLONOSCOPY  2009   family history of colon ca  . DILITATION & CURRETTAGE/HYSTROSCOPY WITH NOVASURE ABLATION  08/26/2012   Procedure: DILATATION & CURETTAGE/HYSTEROSCOPY WITH NOVASURE ABLATION;  Surgeon: Lenoard Aden, MD;  Location: [facility];  Service: Gynecology;  Laterality: N/A;  . lipoma     . WISDOM TOOTH EXTRACTION     Social History   Social History Narrative   Patient is employed.    Patient married with 2 children.       Objective: Vital Signs: BP 120/83 (BP Location: Left Arm, Patient Position: Sitting, Cuff  Size: Normal)   Pulse 76   Resp 12   Ht 5\' 3"  (1.6 m)   Wt 142 lb (64.4 kg)   BMI 25.15 kg/m    Physical Exam Vitals signs and nursing note reviewed.  Constitutional:      Appearance: She is well-developed.  HENT:     Head: Normocephalic and atraumatic.  Eyes:     Conjunctiva/sclera: Conjunctivae normal.  Neck:      Musculoskeletal: Normal range of motion.  Cardiovascular:     Rate and Rhythm: Normal rate and regular rhythm.     Heart sounds: Normal heart sounds.  Pulmonary:     Effort: Pulmonary effort is normal.     Breath sounds: Normal breath sounds.  Abdominal:     General: Bowel sounds are normal.     Palpations: Abdomen is soft.  Lymphadenopathy:     Cervical: No cervical adenopathy.  Skin:    General: Skin is warm and dry.     Capillary Refill: Capillary refill takes less than 2 seconds.  Neurological:     Mental Status: She is alert and oriented to person, place, and time.  Psychiatric:        Behavior: Behavior normal.      Musculoskeletal Exam: C-spine thoracic lumbar spine good range of motion.  Shoulder joints elbow joints wrist joint MCPs PIPs and DIPs with good range of motion with no synovitis.  Hip joints, knee joints, ankles, MTPs PIPs and DIPs with good range of motion with no synovitis.  CDAI Exam: CDAI Score: 0.4  Patient Global Assessment: 2 (mm); Provider Global Assessment: 2 (mm) Swollen: 0 ; Tender: 0  Joint Exam   Not documented   There is currently no information documented on the homunculus. Go to the Rheumatology activity and complete the homunculus joint exam.  Investigation: No additional findings.  Imaging: No results found.  Recent Labs: Lab Results  Component Value Date   WBC 6.3 06/09/2018   HGB 14.3 06/09/2018   PLT 286 06/09/2018   NA 144 06/09/2018   K 4.3 06/09/2018   CL 104 06/09/2018   CO2 30 06/09/2018   GLUCOSE 144 (H) 06/09/2018   BUN 16 06/09/2018   CREATININE 0.95 06/09/2018   BILITOT 0.5 06/09/2018   ALKPHOS 65 05/07/2017   AST 28 06/09/2018   ALT 24 06/09/2018   PROT 6.6 06/09/2018   ALBUMIN 4.4 05/07/2017   CALCIUM 9.7 06/09/2018   GFRAA 81 06/09/2018   QFTBGOLDPLUS NEGATIVE 06/09/2018    Speciality Comments: No specialty comments available.  Procedures:  No procedures performed Allergies: Peg [polyethylene  glycol]   Assessment / Plan:     Visit Diagnoses: Rheumatoid arthritis involving multiple sites with positive rheumatoid factor (HCC) - Positive RF, positive anti-CCP.  Patient has no synovitis on examination.  She denies any joint swelling or recent episode of flare.  She continues to have some discomfort.  She works out on a regular basis.  High risk medication use - Humira 40 mg every 14 days.  Last TB gold negative on 06/09/2018.  Most recent CBC/CMP within normal limits on 06/09/2018.  Due for CBC/CMP today and then every 3 months.  Standing orders are in place.  Recommend flu, Pneumovax 23, Prevnar 13, and Shingrix as indicated.  As she has been experiencing some burning sensation from Humira injections we will switch her to new citrate free formulation.  X-ray of bilateral hands and feet taken on 10/26/2017.  Other fatigue - Plan: VITAMIN D 25 Hydroxy (  Vit-D Deficiency, Fractures), Zinc  Other insomnia-good sleep hygiene discussed.  Postmenopausal - DEXA 8/14/2019T-score: 0.7BMD: 1.033  Narcolepsy due to underlying condition without cataplexy  Attention deficit hyperactivity disorder (ADHD), predominantly inattentive type   Orders: Orders Placed This Encounter  Procedures  . VITAMIN D 25 Hydroxy (Vit-D Deficiency, Fractures)  . CBC with Differential/Platelet  . COMPLETE METABOLIC PANEL WITH GFR  . Zinc   No orders of the defined types were placed in this encounter.     Follow-Up Instructions: Return in about 5 months (around 02/21/2019) for Rheumatoid arthritis.   Pollyann Savoy, MD  Note - This record has been created using Animal nutritionist.  Chart creation errors have been sought, but may not always  have been located. Such creation errors do not reflect on  the standard of medical care.

## 2018-09-15 ENCOUNTER — Telehealth: Payer: Self-pay | Admitting: Rheumatology

## 2018-09-15 NOTE — Telephone Encounter (Signed)
Patient left a voicemail requesting a return call to discuss adding tests to her labwork.  Patient states she would like her Vitamin D checking.

## 2018-09-15 NOTE — Telephone Encounter (Signed)
Vitamin D, and Zinc.

## 2018-09-19 ENCOUNTER — Telehealth: Payer: Self-pay | Admitting: Neurology

## 2018-09-19 ENCOUNTER — Other Ambulatory Visit: Payer: Self-pay | Admitting: Neurology

## 2018-09-19 DIAGNOSIS — G47419 Narcolepsy without cataplexy: Secondary | ICD-10-CM

## 2018-09-19 MED ORDER — LISDEXAMFETAMINE DIMESYLATE 70 MG PO CAPS: 70.0000 mg | ORAL_CAPSULE | Freq: Every day | ORAL | 0 refills | Status: DC

## 2018-09-19 NOTE — Telephone Encounter (Signed)
Pt is calling for a refill on her lisdexamfetamine (VYVANSE) 70 MG capsule [facility] [phone]

## 2018-09-19 NOTE — Telephone Encounter (Signed)
I have routed this request to Dr Dohmeier for review. The pt is due for the medication and [place] registry was verified.  

## 2018-09-20 ENCOUNTER — Ambulatory Visit: Payer: Self-pay | Admitting: Neurology

## 2018-09-20 ENCOUNTER — Encounter: Payer: Self-pay | Admitting: Adult Health

## 2018-09-20 ENCOUNTER — Ambulatory Visit: Payer: BC Managed Care – PPO | Admitting: Adult Health

## 2018-09-20 VITALS — BP 113/70 | HR 70 | Ht 63.0 in | Wt 141.0 lb

## 2018-09-20 DIAGNOSIS — G47419 Narcolepsy without cataplexy: Secondary | ICD-10-CM

## 2018-09-20 NOTE — Patient Instructions (Signed)
Your Plan:  Continue Vyvanse  We will check on insurance status If your symptoms worsen or you develop new symptoms please let us know.   Thank you for coming to see [place] at [facility]. I hope we have been able to provide you high quality care today.  You may receive a patient satisfaction survey over the next few weeks. We would appreciate your feedback and comments so that we may continue to improve ourselves and the health of our patients.

## 2018-09-20 NOTE — Progress Notes (Signed)
PATIENT: Kathleen Alvarez DOB: 12-25-1965  REASON FOR VISIT: follow up HISTORY FROM: patient  HISTORY OF PRESENT ILLNESS: Today 09/20/18: Kathleen Alvarez is a 52 year old female with a history of narcolepsy.  She returns today for follow-up.  She is currently on Vyvanse.  She reports that she tolerates this well.  This continues to work well for her.  She denies falling asleep at work or while driving.  Denies any heart palpitations or depression.  She states that she does have an Adderall prescription but she only uses this if she has an episode where she cannot stay awake.  Reports that she only uses this approximately 5 times a year.  She denies any cataplectic events.  She returns today for evaluation.   HISTORY Kathleen Alvarez is a 52 y.o. female and  een here as a revisit for medication refill in the treatment of narcolepsy.    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  She had this condition since college years and was first seen in this practice in 2007. For the treatment of for hypersomnia she was started on Provigil but within 6 months she had noted some irritability and anger on that medication, this in addition was more complicated as the patient became pregnant.  Provigil was therefore discontinued and she was started on Adderall , Tolerated much better for about a year she was eventually changed to Vyvanse in 2008 . In April 2016 she was diagnosed with rheumatoid arthritis, takes MTX. this condition deepens her fatigue further.  She continues to do well on Vyvanse after over 7 years. She still sleeps 10-12 hours over a 24 hour period. She is not snoring. No apnea.   Interval history from 09/15/2016. I have the pleasure of seeing Dr. [name], Kathleen Alvarez, for routine revisit. The patient introduced to tolerate Vyvanse which she will have taken for 10 years next year. Refills today. No  evidence of hypertension, hypersomnia, hyperglycemia.  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  Her degree as an exercise physiology. She endorsed the Epworth sleepiness scale today still at 17 points without medicine and under the influence of Vyvanse around 11 points. We continue Vyvanse.  She has been on the medication now for 10 years. She has been sleepy all her adult life, since college, perhaps late years of high school.   REVIEW OF SYSTEMS: Out of a complete 14 system review of symptoms, the patient complains only of the following symptoms, and all other reviewed systems are negative.  See HPI  ALLERGIES: Allergies  Allergen Reactions  . Peg [Polyethylene Glycol] Itching and Nausea And Vomiting    Used when had colonoscopy prep    HOME MEDICATIONS: Outpatient Medications Prior to Visit  Medication Sig Dispense Refill  . albuterol (PROVENTIL HFA;VENTOLIN HFA) 108 (90 BASE) MCG/ACT inhaler Inhale 2 puffs into the lungs every 6 (six) hours as needed. For exercise-induced bronchospasm    . amphetamine-dextroamphetamine (ADDERALL) 10 MG tablet Take 10 mg by mouth as needed.    . cholecalciferol (VITAMIN D) 1000 UNITS tablet Take 1,000 Units by mouth daily.    Marland Kitchen HUMIRA PEN 40 MG/0.8ML PNKT INJECT ONE PEN SUBCUTANEOUSLY EVERY OTHER WEEK. REFRIGERATE. 3 each 0  . ibuprofen (ADVIL,MOTRIN) 200 MG tablet Take 800 mg  by mouth every 6 (six) hours as needed. For muscle soreness    . lisdexamfetamine (VYVANSE) 70 MG capsule Take 1 capsule (70 mg total) by mouth daily. 90 capsule 0  . Multiple Vitamin (MULTIVITAMIN WITH MINERALS) TABS Take 1 tablet by mouth daily.    Marland Kitchen venlafaxine (EFFEXOR) 37.5 MG tablet Take 37.5 mg by mouth daily.    . fluconazole (DIFLUCAN) 150 MG tablet Take 2 as directed (Patient not taking: Reported on 04/20/2018) 2 tablet 2  .  TUBERCULIN SYR 1CC/27GX1/2" (B-D TB SYRINGE 1CC/27GX1/2") 27G X 1/2" 1 ML MISC Used as directed once per week (Patient not taking: Reported on 04/20/2018) 12 each 3  . amphetamine-dextroamphetamine (ADDERALL) 10 MG tablet Take 1 tablet (10 mg total) by mouth daily before breakfast. (Patient taking differently: Take 10 mg by mouth as needed. ) 30 tablet 0   No facility-administered medications prior to visit.     PAST MEDICAL HISTORY: Past Medical History:  Diagnosis Date  . Anxiety   . 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    PAST SURGICAL HISTORY: Past Surgical History:  Procedure Laterality Date  . CESAREAN SECTION     x2  . COLONOSCOPY  2009   family history of colon ca  . DILITATION & CURRETTAGE/HYSTROSCOPY WITH NOVASURE ABLATION  08/26/2012   Procedure: DILATATION & CURETTAGE/HYSTEROSCOPY WITH NOVASURE ABLATION;  Surgeon: Lenoard Aden, MD;  Location: [facility];  Service: Gynecology;  Laterality: N/A;  . lipoma     . WISDOM TOOTH EXTRACTION      FAMILY HISTORY: Family History  Problem Relation Age of Onset  . Colon cancer Father        deceased  . Liver cancer Father 21       deceased     SOCIAL HISTORY: Social History   Socioeconomic History  . Marital status: Married    Spouse name: [name]   . Number of children: 2  . Years of education: 12+  . Highest education level: Not on file  Occupational History  . Occupation: Photographer: [employer]  Social Needs  . Financial resource strain: Not on file  . Food insecurity:    Worry: Not on file    Inability: Not on file  . Transportation needs:    Medical: Not on file    Non-medical: Not on file  Tobacco Use  . Smoking status: Never Smoker  . Smokeless tobacco: Never Used  Substance and Sexual Activity  . Alcohol use: No  . Drug use: No  . Sexual activity: Not on file  Lifestyle  . Physical activity:    Days per week: Not on file    Minutes per session: Not on file  . Stress:  Not on file  Relationships  . Social connections:    Talks on phone: Not on file    Gets together: Not on file    Attends religious service: Not on file    Active member of club or organization: Not on file    Attends meetings of clubs or organizations: Not on file    Relationship status: Not on file  . Intimate partner violence:    Fear of current or ex partner: Not on file    Emotionally abused: Not on file    Physically abused: Not on file    Forced sexual activity: Not on file  Other Topics Concern  . Not on file  Social History Narrative   Patient is employed.  Patient married with 2 children.         PHYSICAL EXAM  Vitals:   09/20/18 1054  BP: 113/70  Pulse: 70  Weight: 140 lb 15.7 oz (63.9 kg)  Height: 5\' 3"  (1.6 m)   Body mass index is 24.97 kg/m.  Generalized: Well developed, in no acute distress   Neurological examination  Mentation: Alert oriented to time, place, history taking. Follows all commands speech and language fluent Cranial nerve II-XII: Extraocular movements were full, visual field were full on confrontational test. Facial sensation and strength were normal. Head turning and shoulder shrug  were normal and symmetric. Motor: The motor testing reveals 5 over 5 strength of all 4 extremities. Good symmetric motor tone is noted throughout.  Sensory: Sensory testing is intact to soft touch on all 4 extremities. No evidence of extinction is noted.  Coordination: Cerebellar testing reveals good finger-nose-finger and heel-to-shin bilaterally.  Gait and station: Gait is normal.   DIAGNOSTIC DATA (LABS, IMAGING, TESTING) - I reviewed patient records, labs, notes, testing and imaging myself where available.  Lab Results  Component Value Date   WBC 6.3 06/09/2018   HGB 14.3 06/09/2018   HCT 42.3 06/09/2018   MCV 88.5 06/09/2018   PLT 286 06/09/2018      Component Value Date/Time   NA 144 06/09/2018 1629   K 4.3 06/09/2018 1629   CL 104  06/09/2018 1629   CO2 30 06/09/2018 1629   GLUCOSE 144 (H) 06/09/2018 1629   BUN 16 06/09/2018 1629   CREATININE 0.95 06/09/2018 1629   CALCIUM 9.7 06/09/2018 1629   PROT 6.6 06/09/2018 1629   ALBUMIN 4.4 05/07/2017 1203   AST 28 06/09/2018 1629   ALT 24 06/09/2018 1629   ALKPHOS 65 05/07/2017 1203   BILITOT 0.5 06/09/2018 1629   GFRNONAA 70 06/09/2018 1629   GFRAA 81 06/09/2018 1629     ASSESSMENT AND PLAN 52 y.o. year old female  has a past medical history of Anxiety and 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.00) (09/19/2014). here with:  1.  Narcolepsy without cataplexy  The patient will continue on Vyvanse and Adderall.  The patient is concerned that her authorization for Vyvanse will expire and she will run out of medication before they will refill it.  I advised that we will call the pharmacy and try to do a preauthorization.  She voiced understanding.  She will follow-up in 1 year or sooner if needed.  I spent 15 minutes with the patient. 50% of this time was spent discussing her medication  Butch Penny, MSN, NP-C 09/20/2018, 11:05 AM [facility] [address] [place], [place] [address] [phone]

## 2018-09-21 NOTE — Telephone Encounter (Signed)
Patient advised that she may have her labs drawn at her appointment on 09/21/18. Will orders for appointment.

## 2018-09-22 ENCOUNTER — Ambulatory Visit: Payer: BC Managed Care – PPO | Admitting: Rheumatology

## 2018-09-22 ENCOUNTER — Encounter: Payer: Self-pay | Admitting: Rheumatology

## 2018-09-22 VITALS — BP 120/83 | HR 76 | Resp 12 | Ht 63.0 in | Wt 142.0 lb

## 2018-09-22 DIAGNOSIS — M0579 Rheumatoid arthritis with rheumatoid factor of multiple sites without organ or systems involvement: Secondary | ICD-10-CM | POA: Diagnosis not present

## 2018-09-22 DIAGNOSIS — Z780 Asymptomatic menopausal state: Secondary | ICD-10-CM

## 2018-09-22 DIAGNOSIS — F900 Attention-deficit hyperactivity disorder, predominantly inattentive type: Secondary | ICD-10-CM

## 2018-09-22 DIAGNOSIS — Z79899 Other long term (current) drug therapy: Secondary | ICD-10-CM | POA: Diagnosis not present

## 2018-09-22 DIAGNOSIS — G4709 Other insomnia: Secondary | ICD-10-CM

## 2018-09-22 DIAGNOSIS — R5383 Other fatigue: Secondary | ICD-10-CM

## 2018-09-22 DIAGNOSIS — G47429 Narcolepsy in conditions classified elsewhere without cataplexy: Secondary | ICD-10-CM

## 2018-09-22 MED ORDER — ADALIMUMAB 40 MG/0.4ML ~~LOC~~ AJKT: 40.0000 mg | AUTO-INJECTOR | SUBCUTANEOUS | 0 refills | Status: DC

## 2018-09-22 NOTE — Patient Instructions (Signed)
Standing Labs We placed an order today for your standing lab work.    Please come back and get your standing labs in March and every 3 months  We have open lab Monday through Friday from 8:30-11:30 AM and 1:30-4:00 PM  at the office of Dr. Christena Sunderlin.   You may experience shorter wait times on Monday and Friday afternoons. The office is located at [address], [address], [place], [place] [address] No appointment is necessary.   Labs are drawn by [facility].  You may receive a bill from [facility] for your lab work.  If you wish to have your labs drawn at another location, please call the office 24 hours in advance to send orders.  If you have any questions regarding directions or hours of operation,  please call [phone].   Just as a reminder please drink plenty of water prior to coming for your lab work. Thanks!  

## 2018-09-22 NOTE — Progress Notes (Signed)
Pharmacy Note  Patient presents to [facility] to see Dr. Corliss Skains for rheumatoid arthritis.  Patient interested in switching to citrate free Humira due to injection site reactions.   Medication Samples have been provided to the patient.  Drug name: Humira      Strength: 40 mg/0.69ml Qty: 1   LOT: [id number]  Exp.Date: 06/2019  Dosing instructions: Inject every 14 days  The patient has been instructed regarding the correct time, dose, and frequency of taking this medication, including desired effects and most common side effects. Discussed other management strategies such as icing and taking an antihistamine.  [facility] at patient's convenience since she still has three boxes at home.   All questions encouraged and answered.  Instructed patient to call with any further questions or concerns.  Verlin Fester, PharmD, [facility] Rheumatology Clinical Pharmacist  09/22/2018 12:34 PM

## 2018-09-23 NOTE — Progress Notes (Signed)
Should stop Vit D. Higher than normal limits.

## 2018-09-24 LAB — CBC WITH DIFFERENTIAL/PLATELET
Absolute Monocytes: 480 cells/uL (ref 200–950)
Basophils Absolute: 70 cells/uL (ref 0–200)
Basophils Relative: 1.4 %
Eosinophils Absolute: 110 cells/uL (ref 15–500)
Eosinophils Relative: 2.2 %
HCT: 44.3 % (ref 35.0–45.0)
Hemoglobin: 14.9 g/dL (ref 11.7–15.5)
Lymphs Abs: 2410 cells/uL (ref 850–3900)
MCH: 29.8 pg (ref 27.0–33.0)
MCHC: 33.6 g/dL (ref 32.0–36.0)
MCV: 88.6 fL (ref 80.0–100.0)
MPV: 11.2 fL (ref 7.5–12.5)
Monocytes Relative: 9.6 %
Neutro Abs: 1930 cells/uL (ref 1500–7800)
Neutrophils Relative %: 38.6 %
Platelets: 296 10*3/uL (ref 140–400)
RBC: 5 10*6/uL (ref 3.80–5.10)
RDW: 12 % (ref 11.0–15.0)
Total Lymphocyte: 48.2 %
WBC: 5 10*3/uL (ref 3.8–10.8)

## 2018-09-24 LAB — COMPLETE METABOLIC PANEL WITH GFR
AG Ratio: 2 (calc) (ref 1.0–2.5)
ALT: 26 U/L (ref 6–29)
AST: 29 U/L (ref 10–35)
Albumin: 4.5 g/dL (ref 3.6–5.1)
Alkaline phosphatase (APISO): 78 U/L (ref 33–130)
BUN: 19 mg/dL (ref 7–25)
CO2: 34 mmol/L — ABNORMAL HIGH (ref 20–32)
Calcium: 10 mg/dL (ref 8.6–10.4)
Chloride: 101 mmol/L (ref 98–110)
Creat: 0.87 mg/dL (ref 0.50–1.05)
GFR, Est African American: 90 mL/min/{1.73_m2} (ref >=60)
GFR, Est Non African American: 78 mL/min/{1.73_m2} (ref >=60)
Globulin: 2.3 g/dL (calc) (ref 1.9–3.7)
Glucose, Bld: 87 mg/dL (ref 65–99)
Potassium: 4.7 mmol/L (ref 3.5–5.3)
Sodium: 142 mmol/L (ref 135–146)
Total Bilirubin: 0.5 mg/dL (ref 0.2–1.2)
Total Protein: 6.8 g/dL (ref 6.1–8.1)

## 2018-09-24 LAB — ZINC: Zinc: 81 ug/dL (ref 60–130)

## 2018-09-24 LAB — VITAMIN D 25 HYDROXY (VIT D DEFICIENCY, FRACTURES): Vit D, 25-Hydroxy: 109 ng/mL — ABNORMAL HIGH (ref 30–100)

## 2018-09-26 NOTE — Progress Notes (Signed)
All other labs are within normal limits.  Please notify patient that her zinc level was normal.

## 2018-11-30 ENCOUNTER — Telehealth: Payer: Self-pay | Admitting: Neurology

## 2018-11-30 ENCOUNTER — Other Ambulatory Visit: Payer: Self-pay | Admitting: Neurology

## 2018-11-30 DIAGNOSIS — G47419 Narcolepsy without cataplexy: Secondary | ICD-10-CM

## 2018-11-30 MED ORDER — LISDEXAMFETAMINE DIMESYLATE 70 MG PO CAPS: 70.0000 mg | ORAL_CAPSULE | Freq: Every day | ORAL | 0 refills | Status: DC

## 2018-11-30 NOTE — Telephone Encounter (Signed)
Pt called stating that she is in need of a refill for her lisdexamfetamine (VYVANSE) 70 MG capsule but it is needing a PA. Please advise.

## 2018-11-30 NOTE — Telephone Encounter (Signed)
I have routed this request to Dr Vickey Huger for review. The pt is due for the medication and [place] registry was verified.  Also per our records patient's insurance appears to be the same and the PA that we had on file for the medication looks like it was covered until 09/2020. I will wait and see if the pharmacy send a request for a new PA to be completed.

## 2018-12-15 ENCOUNTER — Telehealth: Payer: Self-pay | Admitting: Neurology

## 2018-12-15 NOTE — Telephone Encounter (Signed)
Pt called in again stating PA was needed for the medication. I had not received anything from pharmacy and thought she had another year on the PA.  Went to cover my meds and completed PA. Sent in and I am waiting on clinical questions from that pharmacy to send to me. OOI:[id number]

## 2018-12-16 NOTE — Telephone Encounter (Signed)
Vyvanse 70 mg has been APPROVED by [facility]. I called the patient @ [phone] and LVM (ok per DPR) letting her know Vyvanse has been approved through 12/15/2021. Left office number for call back if she has any questions.

## 2018-12-16 NOTE — Telephone Encounter (Signed)
Pt will be out of the medication on Monday, last tab will be taken on Sunday. She is very anxious about not getting this medication on time.  FYI

## 2018-12-16 NOTE — Telephone Encounter (Signed)
I also spoke with [facility] and informed her of the approval. She verbalized understanding and stated she would run it through insurance. She is aware that pt stated she would run out this weekend.

## 2018-12-16 NOTE — Telephone Encounter (Signed)
Question response on PA ready. I completed the PA and requested an urgent review. Awaiting determination within 24 hours.   "If [employer] has not responded to your request within 24 hours, contact [employer] at [phone]."

## 2019-01-26 ENCOUNTER — Other Ambulatory Visit: Payer: Self-pay | Admitting: Rheumatology

## 2019-01-26 ENCOUNTER — Other Ambulatory Visit: Payer: Self-pay | Admitting: *Deleted

## 2019-01-26 DIAGNOSIS — Z79899 Other long term (current) drug therapy: Secondary | ICD-10-CM

## 2019-01-26 DIAGNOSIS — M0579 Rheumatoid arthritis with rheumatoid factor of multiple sites without organ or systems involvement: Secondary | ICD-10-CM

## 2019-01-26 NOTE — Telephone Encounter (Signed)
Last Visit: 09/22/18 Next Visit: 02/15/19 Labs: 09/22/18 WNL TB Gold: 06/09/18 Neg   Patient advised she is due to update labs. Patient will update this week.   Okay to refill 30 day supply per Dr. Corliss Skains

## 2019-01-31 ENCOUNTER — Telehealth: Payer: Self-pay

## 2019-01-31 LAB — CBC WITH DIFFERENTIAL/PLATELET
Basophils Absolute: 0.1 10*3/uL (ref 0.0–0.2)
Basos: 1 %
EOS (ABSOLUTE): 0.1 10*3/uL (ref 0.0–0.4)
Eos: 1 %
Hematocrit: 42.7 % (ref 34.0–46.6)
Hemoglobin: 14.5 g/dL (ref 11.1–15.9)
Immature Grans (Abs): 0 10*3/uL (ref 0.0–0.1)
Immature Granulocytes: 1 %
Lymphocytes Absolute: 2.2 10*3/uL (ref 0.7–3.1)
Lymphs: 31 %
MCH: 29.7 pg (ref 26.6–33.0)
MCHC: 34 g/dL (ref 31.5–35.7)
MCV: 88 fL (ref 79–97)
Monocytes Absolute: 0.6 10*3/uL (ref 0.1–0.9)
Monocytes: 8 %
Neutrophils Absolute: 4.1 10*3/uL (ref 1.4–7.0)
Neutrophils: 58 %
Platelets: 314 10*3/uL (ref 150–450)
RBC: 4.88 x10E6/uL (ref 3.77–5.28)
RDW: 12 % (ref 11.7–15.4)
WBC: 7.1 10*3/uL (ref 3.4–10.8)

## 2019-01-31 LAB — CMP14+EGFR
ALT: 44 IU/L — ABNORMAL HIGH (ref 0–32)
AST: 32 IU/L (ref 0–40)
Albumin/Globulin Ratio: 2.3 — ABNORMAL HIGH (ref 1.2–2.2)
Albumin: 4.5 g/dL (ref 3.8–4.9)
Alkaline Phosphatase: 75 IU/L (ref 39–117)
BUN/Creatinine Ratio: 21 (ref 9–23)
BUN: 19 mg/dL (ref 6–24)
Bilirubin Total: 0.4 mg/dL (ref 0.0–1.2)
CO2: 27 mmol/L (ref 20–29)
Calcium: 9.7 mg/dL (ref 8.7–10.2)
Chloride: 103 mmol/L (ref 96–106)
Creatinine, Ser: 0.9 mg/dL (ref 0.57–1.00)
GFR calc Af Amer: 86 mL/min/{1.73_m2} (ref >59)
GFR calc non Af Amer: 74 mL/min/{1.73_m2} (ref >59)
Globulin, Total: 2 g/dL (ref 1.5–4.5)
Glucose: 77 mg/dL (ref 65–99)
Potassium: 5.3 mmol/L — ABNORMAL HIGH (ref 3.5–5.2)
Sodium: 144 mmol/L (ref 134–144)
Total Protein: 6.5 g/dL (ref 6.0–8.5)

## 2019-01-31 NOTE — Telephone Encounter (Signed)
Error

## 2019-01-31 NOTE — Progress Notes (Signed)
ALT elevated. Avoid NSAIDS and alcohol.

## 2019-02-13 NOTE — Progress Notes (Signed)
Office Visit Note  Patient: Kathleen Alvarez             Date of Birth: 07/26/1966           MRN: [id number]             PCP: Olivia Mackieaavon, Richard, MD Referring: Olivia Mackieaavon, Richard, MD Visit Date: 02/15/2019 Occupation: @GUAROCC @  Subjective:  Medication management.   History of Present Illness: Kathleen Alvarez is a 53 y.o. female with history of seropositive rheumatoid arthritis.  She states she has been doing quite well on Humira without any increased joint pain or discomfort.  She has been off methotrexate due to elevated LFTs.  She has not noticed any worsening of her joint symptoms.  She has been quite active despite lockdown due to COVID-19 pandemic.  She states she has been working out outside [employer].  Activities of Daily Living:  Patient reports morning stiffness for 0 minutes.   Patient Denies nocturnal pain.  Difficulty dressing/grooming: Denies Difficulty climbing stairs: Denies Difficulty getting out of chair: Denies Difficulty using hands for taps, buttons, cutlery, and/or writing: Denies  Review of Systems  Constitutional: Negative for fatigue.  HENT: Negative for mouth sores, mouth dryness and nose dryness.   Eyes: Negative for pain, itching and dryness.  Respiratory: Negative for shortness of breath, wheezing and difficulty breathing.   Cardiovascular: Negative for palpitations, irregular heartbeat and swelling in legs/feet.  Gastrointestinal: Negative for abdominal pain, constipation and diarrhea.  Endocrine: Negative for increased urination.  Genitourinary: Negative for painful urination and pelvic pain.  Musculoskeletal: Negative for arthralgias, joint pain, joint swelling and morning stiffness.  Skin: Negative for rash and redness.  Allergic/Immunologic: Negative for susceptible to infections.  Neurological: Negative for dizziness, light-headedness, headaches, memory loss and weakness.  Hematological: Negative for bruising/bleeding tendency.  Psychiatric/Behavioral:  Negative for confusion. The patient is not nervous/anxious.     PMFS History:  Patient Active Problem List   Diagnosis Date Noted  . Strain of calf muscle 03/17/2018  . On methotrexate therapy 09/20/2017  . Rheumatoid arthritis involving multiple sites with positive rheumatoid factor (HCC) 05/03/2017  . Narcolepsy 09/15/2016  . 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 Anxiety   . 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 Colon cancer Father        deceased  . Liver cancer Father [phone]       deceased    Past Surgical History:  Procedure Laterality Date  . CESAREAN SECTION     x2  . COLONOSCOPY  2009   family history of colon ca  . DILITATION & CURRETTAGE/HYSTROSCOPY WITH NOVASURE ABLATION  08/26/2012   Procedure: DILATATION & CURETTAGE/HYSTEROSCOPY WITH NOVASURE ABLATION;  Surgeon: Lenoard Adenichard J Taavon, MD;  Location: [facility];  Service: Gynecology;  Laterality: N/A;  . lipoma     . WISDOM TOOTH EXTRACTION     Social History   Social History Narrative   Patient is employed.    Patient married with 2 children.      Immunization History  Administered Date(s) Administered  . Influenza, Seasonal, Injecte, Preservative Fre 08/31/2014     Objective: Vital Signs: BP 106/73 (BP Location: Left Arm, Patient Position: Sitting, Cuff Size: Normal)   Pulse 77   Resp 12   Ht 5\' 3"  (1.6 m)   Wt 145 lb (65.8 kg)  BMI 25.69 kg/m    Physical Exam Vitals signs and nursing note reviewed.  Constitutional:      Appearance: She is well-developed.  HENT:     Head: Normocephalic and atraumatic.  Eyes:     Conjunctiva/sclera: Conjunctivae normal.  Neck:     Musculoskeletal: Normal range of motion.  Cardiovascular:     Rate and Rhythm: Normal rate and regular rhythm.     Heart sounds:  Normal heart sounds.  Pulmonary:     Effort: Pulmonary effort is normal.     Breath sounds: Normal breath sounds.  Abdominal:     General: Bowel sounds are normal.     Palpations: Abdomen is soft.  Lymphadenopathy:     Cervical: No cervical adenopathy.  Skin:    General: Skin is warm and dry.     Capillary Refill: Capillary refill takes less than 2 seconds.  Neurological:     Mental Status: She is alert and oriented to person, place, and time.  Psychiatric:        Behavior: Behavior normal.      Musculoskeletal Exam: C-spine thoracic and lumbar spine good range of motion.  Shoulder joints elbow joints wrist joint MCPs PIPs DIPs been good range of motion with no synovitis.  Hip joints knee joints ankles MTPs PIPs with good range of motion with no synovitis.  CDAI Exam: CDAI Score: 0  Patient Global Assessment: 0 (mm); Provider Global Assessment: 0 (mm) Swollen: 0 ; Tender: 0  Joint Exam   Not documented   There is currently no information documented on the homunculus. Go to the Rheumatology activity and complete the homunculus joint exam.  Investigation: No additional findings.  Imaging: No results found.  Recent Labs: Lab Results  Component Value Date   WBC 7.1 01/30/2019   HGB 14.5 01/30/2019   PLT 314 01/30/2019   NA 144 01/30/2019   K 5.3 (H) 01/30/2019   CL 103 01/30/2019   CO2 27 01/30/2019   GLUCOSE 77 01/30/2019   BUN 19 01/30/2019   CREATININE 0.90 01/30/2019   BILITOT 0.4 01/30/2019   ALKPHOS 75 01/30/2019   AST 32 01/30/2019   ALT 44 (H) 01/30/2019   PROT 6.5 01/30/2019   ALBUMIN 4.5 01/30/2019   CALCIUM 9.7 01/30/2019   GFRAA 86 01/30/2019   QFTBGOLDPLUS NEGATIVE 06/09/2018    Speciality Comments: No specialty comments available.  Procedures:  No procedures performed Allergies: Peg [polyethylene glycol]   Assessment / Plan:     Visit Diagnoses: Rheumatoid arthritis involving multiple sites with positive rheumatoid factor (HCC) - Positive  RF, positive anti-CCP.  Patient is clinically doing well with no synovitis on examination.  She has been on Humira monotherapy now.  She states her last flare was about a year ago.  We discussed displacing Humira to every 3 weeks if possible.  If she has increased discomfort on spacing the dose then she can go back to every other week.  High risk medication use - Humira 40 mg every 14 days.  Her most recent labs showed some elevation of LFTs.  Patient reports that may be related to drinking alcohol.  She will stop drinking alcohol completely.  We will check labs again in July and then every 3 months.  Her next TB Gold is due in September.  Other insomnia-she continues to have insomnia.  Good sleep hygiene and use of melatonin was discussed.  Other fatigue-she has been experiencing fatigue related to insomnia.  Narcolepsy due to underlying condition without  cataplexy  Attention deficit hyperactivity disorder (ADHD), predominantly inattentive type  Postmenopausal - DEXA 8/14/2019T-score: 0.7BMD: 1.033   Orders: No orders of the defined types were placed in this encounter.  No orders of the defined types were placed in this encounter.     Follow-Up Instructions: Return in about 5 months (around 07/18/2019) for Rheumatoid arthritis.   Pollyann Savoy, MD  Note - This record has been created using Animal nutritionist.  Chart creation errors have been sought, but may not always  have been located. Such creation errors do not reflect on  the standard of medical care.

## 2019-02-15 ENCOUNTER — Other Ambulatory Visit: Payer: Self-pay

## 2019-02-15 ENCOUNTER — Encounter: Payer: Self-pay | Admitting: Rheumatology

## 2019-02-15 ENCOUNTER — Ambulatory Visit: Payer: BC Managed Care – PPO | Admitting: Rheumatology

## 2019-02-15 VITALS — BP 106/73 | HR 77 | Resp 12 | Ht 63.0 in | Wt 145.0 lb

## 2019-02-15 DIAGNOSIS — M0579 Rheumatoid arthritis with rheumatoid factor of multiple sites without organ or systems involvement: Secondary | ICD-10-CM | POA: Diagnosis not present

## 2019-02-15 DIAGNOSIS — Z780 Asymptomatic menopausal state: Secondary | ICD-10-CM

## 2019-02-15 DIAGNOSIS — R5383 Other fatigue: Secondary | ICD-10-CM | POA: Diagnosis not present

## 2019-02-15 DIAGNOSIS — F900 Attention-deficit hyperactivity disorder, predominantly inattentive type: Secondary | ICD-10-CM

## 2019-02-15 DIAGNOSIS — G4709 Other insomnia: Secondary | ICD-10-CM | POA: Diagnosis not present

## 2019-02-15 DIAGNOSIS — Z79899 Other long term (current) drug therapy: Secondary | ICD-10-CM

## 2019-02-15 DIAGNOSIS — G47429 Narcolepsy in conditions classified elsewhere without cataplexy: Secondary | ICD-10-CM

## 2019-02-15 NOTE — Patient Instructions (Signed)
Standing Labs We placed an order today for your standing lab work.    Please come back and get your standing labs in July and every 3 months  We have open lab Monday through Friday from 8:30-11:30 AM and 1:30-4:00 PM  at the office of Dr. Chuckie Mccathern.   You may experience shorter wait times on Monday and Friday afternoons. The office is located at [address], [address], [place],  [address] No appointment is necessary.   Labs are drawn by [facility].  You may receive a bill from [facility] for your lab work.  If you wish to have your labs drawn at another location, please call the office 24 hours in advance to send orders.  If you have any questions regarding directions or hours of operation,  please call [phone].   Just as a reminder please drink plenty of water prior to coming for your lab work. Thanks!   

## 2019-03-03 ENCOUNTER — Encounter (HOSPITAL_COMMUNITY): Payer: Self-pay

## 2019-03-03 ENCOUNTER — Emergency Department (HOSPITAL_COMMUNITY): Payer: BC Managed Care – PPO

## 2019-03-03 ENCOUNTER — Emergency Department (HOSPITAL_COMMUNITY)
Admission: EM | Admit: 2019-03-03 | Discharge: 2019-03-03 | Disposition: A | Discharge status: Home / Self Care | Payer: BC Managed Care – PPO | Attending: Emergency Medicine | Admitting: Emergency Medicine

## 2019-03-03 ENCOUNTER — Other Ambulatory Visit: Payer: Self-pay

## 2019-03-03 DIAGNOSIS — Z79899 Other long term (current) drug therapy: Secondary | ICD-10-CM | POA: Insufficient documentation

## 2019-03-03 DIAGNOSIS — N200 Calculus of kidney: Secondary | ICD-10-CM

## 2019-03-03 DIAGNOSIS — R1031 Right lower quadrant pain: Secondary | ICD-10-CM | POA: Diagnosis present

## 2019-03-03 LAB — CBC WITH DIFFERENTIAL/PLATELET
Abs Immature Granulocytes: 0.06 10*3/uL (ref 0.00–0.07)
Basophils Absolute: 0.1 10*3/uL (ref 0.0–0.1)
Basophils Relative: 1 %
Eosinophils Absolute: 0.1 10*3/uL (ref 0.0–0.5)
Eosinophils Relative: 0 %
HCT: 44.8 % (ref 36.0–46.0)
Hemoglobin: 14.9 g/dL (ref 12.0–15.0)
Immature Granulocytes: 1 %
Lymphocytes Relative: 14 %
Lymphs Abs: 1.8 10*3/uL (ref 0.7–4.0)
MCH: 30.4 pg (ref 26.0–34.0)
MCHC: 33.3 g/dL (ref 30.0–36.0)
MCV: 91.4 fL (ref 80.0–100.0)
Monocytes Absolute: 0.7 10*3/uL (ref 0.1–1.0)
Monocytes Relative: 5 %
Neutro Abs: 9.9 10*3/uL — ABNORMAL HIGH (ref 1.7–7.7)
Neutrophils Relative %: 79 %
Platelets: 278 10*3/uL (ref 150–400)
RBC: 4.9 MIL/uL (ref 3.87–5.11)
RDW: 11.8 % (ref 11.5–15.5)
WBC: 12.5 10*3/uL — ABNORMAL HIGH (ref 4.0–10.5)
nRBC: 0 % (ref 0.0–0.2)

## 2019-03-03 LAB — URINALYSIS, ROUTINE W REFLEX MICROSCOPIC
Bilirubin Urine: NEGATIVE
Glucose, UA: NEGATIVE mg/dL
Hgb urine dipstick: NEGATIVE
Ketones, ur: 5 mg/dL — AB
Leukocytes,Ua: NEGATIVE
Nitrite: NEGATIVE
Protein, ur: NEGATIVE mg/dL
Specific Gravity, Urine: 1.015 (ref 1.005–1.030)
pH: 7 (ref 5.0–8.0)

## 2019-03-03 LAB — COMPREHENSIVE METABOLIC PANEL
ALT: 26 U/L (ref 0–44)
AST: 30 U/L (ref 15–41)
Albumin: 4.4 g/dL (ref 3.5–5.0)
Alkaline Phosphatase: 78 U/L (ref 38–126)
Anion gap: 10 (ref 5–15)
BUN: 20 mg/dL (ref 6–20)
CO2: 29 mmol/L (ref 22–32)
Calcium: 9.8 mg/dL (ref 8.9–10.3)
Chloride: 102 mmol/L (ref 98–111)
Creatinine, Ser: 0.89 mg/dL (ref 0.44–1.00)
GFR calc Af Amer: >60 mL/min (ref >60)
GFR calc non Af Amer: >60 mL/min (ref >60)
Glucose, Bld: 113 mg/dL — ABNORMAL HIGH (ref 70–99)
Potassium: 3.9 mmol/L (ref 3.5–5.1)
Sodium: 141 mmol/L (ref 135–145)
Total Bilirubin: 0.8 mg/dL (ref 0.3–1.2)
Total Protein: 7.6 g/dL (ref 6.5–8.1)

## 2019-03-03 LAB — LIPASE, BLOOD: Lipase: 36 U/L (ref 11–51)

## 2019-03-03 LAB — PREGNANCY, URINE: Preg Test, Ur: NEGATIVE

## 2019-03-03 MED ORDER — TAMSULOSIN HCL 0.4 MG PO CAPS
0.4000 mg | ORAL_CAPSULE | Freq: Every day | ORAL | Status: DC
  Administered 2019-03-03: 0.4 mg via ORAL
  Filled 2019-03-03: qty 1

## 2019-03-03 MED ORDER — ONDANSETRON HCL 4 MG/2ML IJ SOLN
4.0000 mg | Freq: Once | INTRAMUSCULAR | Status: AC
  Administered 2019-03-03: 4 mg via INTRAVENOUS
  Filled 2019-03-03: qty 2

## 2019-03-03 MED ORDER — MORPHINE SULFATE (PF) 4 MG/ML IV SOLN
4.0000 mg | Freq: Once | INTRAVENOUS | Status: AC
  Administered 2019-03-03: 4 mg via INTRAVENOUS
  Filled 2019-03-03: qty 1

## 2019-03-03 MED ORDER — TAMSULOSIN HCL 0.4 MG PO CAPS: 0.4000 mg | ORAL_CAPSULE | Freq: Every day | ORAL | 0 refills | Status: AC

## 2019-03-03 MED ORDER — ONDANSETRON HCL 4 MG PO TABS: 4.0000 mg | ORAL_TABLET | Freq: Four times a day (QID) | ORAL | 0 refills | Status: DC

## 2019-03-03 MED ORDER — HYDROCODONE-ACETAMINOPHEN 5-325 MG PO TABS: 1.0000 | ORAL_TABLET | ORAL | 0 refills | Status: AC | PRN

## 2019-03-03 MED ORDER — SODIUM CHLORIDE 0.9 % IV BOLUS (SEPSIS)
1000.0000 mL | Freq: Once | INTRAVENOUS | Status: AC
  Administered 2019-03-03: 1000 mL via INTRAVENOUS

## 2019-03-03 MED ORDER — KETOROLAC TROMETHAMINE 15 MG/ML IJ SOLN
15.0000 mg | Freq: Once | INTRAMUSCULAR | Status: AC
  Administered 2019-03-03: 15 mg via INTRAVENOUS
  Filled 2019-03-03: qty 1

## 2019-03-03 NOTE — ED Notes (Signed)
Patient taken to CT.

## 2019-03-03 NOTE — ED Triage Notes (Signed)
Patient c/o right flank pain x 2 hours. Patient c/o dysuria and frequency. Patient has a history of kidney stones.

## 2019-03-03 NOTE — Discharge Instructions (Signed)
Thank you for allowing me to care for you today. Please return to the emergency department if you have new or worsening symptoms. Take your medications as instructed.  ° °

## 2019-03-03 NOTE — ED Provider Notes (Signed)
[facility]-EMERGENCY DEPT Provider Note   CSN: [id number] Arrival date & time: 03/03/19  1849    History   Chief Complaint Chief Complaint  Patient presents with   Flank Pain    HPI Kathleen Alvarez is a 53 y.o. female.     53 y/o F with PMH RA presenting to the ER for acute R flank pain and RLQ pain. Patient reports that this came on suddenly a few hours ago. Reports nausea without vomiting and urinary frequency for the last day. Denies fever, chills, hematuria. No exacerbating or relieving factors. 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      Home Medications    Prior to Admission medications   Medication Sig Start Date End Date Taking? Authorizing Provider  albuterol (PROVENTIL HFA;VENTOLIN HFA) 108 (90 BASE) MCG/ACT inhaler Inhale 2 puffs into the lungs every 6 (six) hours as needed. For exercise-induced bronchospasm   Yes [provider]  amphetamine-dextroamphetamine (ADDERALL) 10 MG tablet Take 10  mg by mouth as needed.   Yes [provider]  cholecalciferol (VITAMIN D) 1000 UNITS tablet Take 1,000 Units by mouth daily.   Yes [provider]  HUMIRA PEN 40 MG/0.4ML PNKT INJECT 1 PEN UNDER THE SKIN EVERY 14 DAYS. Patient taking differently: Inject 1 pen into the skin every 14 (fourteen) days.  01/26/19  Yes Deveshwar, Janalyn Rouse, MD  ibuprofen (ADVIL,MOTRIN) 200 MG tablet Take 800 mg by mouth every 6 (six) hours as needed. For muscle soreness   Yes [provider]  lisdexamfetamine (VYVANSE) 70 MG capsule Take 1 capsule (70 mg total) by mouth daily. 11/30/18  Yes Dohmeier, Porfirio Mylar, MD  Multiple Vitamin (MULTIVITAMIN WITH MINERALS) TABS Take 1 tablet by mouth daily.   Yes [provider]  venlafaxine (EFFEXOR) 37.5 MG tablet Take 37.5 mg by mouth daily.   Yes [provider]  fluconazole (DIFLUCAN) 150 MG tablet Take 2 as directed Patient not taking: Reported on 04/20/2018 11/12/17   Pollyann Savoy, MD  HYDROcodone-acetaminophen (NORCO/VICODIN) 5-325 MG tablet Take 1 tablet by mouth every 4 (four) hours as needed for up to 3 days for severe pain. 03/03/19 03/06/19  Ronnie Doss A, PA-C  ondansetron (ZOFRAN) 4 MG tablet Take 1 tablet (4 mg total) by mouth every 6 (six) hours. 03/03/19   Arlyn Dunning, PA-C  tamsulosin (FLOMAX) 0.4 MG CAPS capsule Take 1 capsule (0.4 mg total) by mouth daily for 7 days. 03/03/19 03/10/19  Arlyn Dunning, PA-C  TUBERCULIN SYR 1CC/27GX1/2" (B-D TB  SYRINGE 1CC/27GX1/2") 27G X 1/2" 1 ML MISC Used as directed once per week 11/16/17   Pollyann Savoyeveshwar, Shaili, MD    Family History Family History  Problem Relation Age of Onset   Colon cancer Father        deceased   Liver cancer Father [phone]       deceased     Social History Social History   Tobacco Use   Smoking status: Never Smoker   Smokeless tobacco: Never Used  Substance Use Topics   Alcohol use: No   Drug use: No     Allergies   Peg [polyethylene  glycol]   Review of Systems Review of Systems  Constitutional: Positive for appetite change. Negative for chills and fever.  Respiratory: Negative for shortness of breath.   Cardiovascular: Negative for chest pain.  Gastrointestinal: Positive for abdominal pain and nausea. Negative for diarrhea and vomiting.  Endocrine: Negative for polyuria.  Genitourinary: Positive for flank pain and pelvic pain. Negative for decreased urine volume, difficulty urinating, dyspareunia, dysuria, urgency and vaginal discharge.  Musculoskeletal: Negative for arthralgias, back pain and myalgias.  Skin: Negative for rash and wound.  Neurological: Negative for dizziness and headaches.     Physical Exam Updated Vital Signs BP 115/73    Pulse (!) 59    Temp 98.8 F (37.1 C) (Oral)    Resp 18    Ht 5\' 3"  (1.6 m)    Wt 64.4 kg    SpO2 96%    BMI 25.15 kg/m   Physical Exam Vitals signs and nursing note reviewed.  Constitutional:      Appearance: Normal appearance. She is normal weight.  HENT:     Head: Normocephalic.     Mouth/Throat:     Mouth: Mucous membranes are moist.  Eyes:     Conjunctiva/sclera: Conjunctivae normal.  Cardiovascular:     Rate and Rhythm: Normal rate and regular rhythm.  Pulmonary:     Effort: Pulmonary effort is normal.  Abdominal:     General: Abdomen is flat. There is no distension.     Tenderness: There is abdominal tenderness. There is no right CVA tenderness, left CVA tenderness, guarding or rebound.  Skin:    General: Skin is dry.  Neurological:     Mental Status: She is alert.  Psychiatric:        Mood and Affect: Mood normal.      ED Treatments / Results  Labs (all labs ordered are listed, but only abnormal results are displayed) Labs Reviewed  URINALYSIS, ROUTINE W REFLEX MICROSCOPIC - Abnormal; Notable for the following components:      Result Value   APPearance CLOUDY (*)    Ketones, ur 5 (*)    All other components within normal limits  CBC WITH  DIFFERENTIAL/PLATELET - Abnormal; Notable for the following components:   WBC 12.5 (*)    Neutro Abs 9.9 (*)    All other components within normal limits  COMPREHENSIVE METABOLIC PANEL - Abnormal; Notable for the following components:   Glucose, Bld 113 (*)    All other components within normal limits  LIPASE, BLOOD  PREGNANCY, URINE    EKG None  Radiology Ct Renal Stone Study  Result Date: 03/03/2019 CLINICAL DATA:  Flank pain EXAM: CT ABDOMEN AND PELVIS WITHOUT CONTRAST TECHNIQUE: Multidetector CT imaging of the abdomen and pelvis was performed following the standard protocol without IV contrast. COMPARISON:  None. FINDINGS: LOWER CHEST: There is no basilar pleural or apical pericardial effusion. HEPATOBILIARY:  The hepatic contours and density are normal. There is no intra- or extrahepatic biliary dilatation. The gallbladder is normal. PANCREAS: The pancreatic parenchymal contours are normal and there is no ductal dilatation. There is no peripancreatic fluid collection. SPLEEN: Normal. ADRENALS/URINARY TRACT: --Adrenal glands: Normal. --Right kidney/ureter: There is a stone within the distal ureter measuring 2-3 mm at the right ureterovesical junction, causing mild hydroureter, no hydronephrosis and minimal perinephric stranding. --Left kidney/ureter: No hydronephrosis, nephroureterolithiasis, perinephric stranding or solid renal mass. --Urinary bladder: Normal for degree of distention STOMACH/BOWEL: --Stomach/Duodenum: There is no hiatal hernia or other gastric abnormality. The duodenal course and caliber are normal. --Small bowel: No dilatation or inflammation. --Colon: No focal abnormality. --Appendix: Normal. VASCULAR/LYMPHATIC: There is aortic atherosclerosis without hemodynamically significant stenosis. No abdominal or pelvic lymphadenopathy. REPRODUCTIVE: Normal uterus.  No adnexal mass. MUSCULOSKELETAL. No bony spinal canal stenosis or focal osseous abnormality. OTHER: None. IMPRESSION: 1.  Mild right obstructive uropathy with 3 mm ureterovesical junction stone causing mild hydroureter and perinephric stranding. 2. No other nephroureterolithiasis. 3. Mild aortic atherosclerosis (ICD10-I70.0). Electronically Signed   By: Deatra Robinson M.D.   On: 03/03/2019 21:30    Procedures Procedures (including critical care time)  Medications Ordered in ED Medications  tamsulosin (FLOMAX) capsule 0.4 mg (has no administration in time range)  morphine 4 MG/ML injection 4 mg (4 mg Intravenous Given 03/03/19 1930)  ketorolac (TORADOL) 15 MG/ML injection 15 mg (15 mg Intravenous Given 03/03/19 1930)  sodium chloride 0.9 % bolus 1,000 mL (0 mLs Intravenous Stopped 03/03/19 2005)  ondansetron (ZOFRAN) injection 4 mg (4 mg Intravenous Given 03/03/19 1930)  morphine 4 MG/ML injection 4 mg (4 mg Intravenous Given 03/03/19 2111)     Initial Impression / Assessment and Plan / ED Course  I have reviewed the triage vital signs and the nursing notes.  Pertinent labs & imaging results that were available during my care of the patient were reviewed by me and considered in my medical decision making (see chart for details).  Clinical Course as of Mar 02 2148  Fri Mar 03, 2019  2128 Patient here with acute R flank pain and RLQ pain with dysuria and frequency for 2 days. Hx of kidney stones. Workup including renal study CT and labs. Pain controlled with 8mg  morphine, toradol and fluids.    [KM]  2130 Labs reveal CBC with WBC 12.5 but otherwise normal. Normal CMP and normal lipase. Urinalysis is also unremarkbable. I think this is likely a kidney stone given her hx. Pending CT results at this time   [KM]  2133 There is a kidney stone distal ureter measuring 2-3 mm at the right ureterovesical junction, causing mild hydroureter, no hydronephrosis and minimal perinephric stranding.  Patient's pain is controlled, she has no signs of pyelonephritis, UTI, AKI.  Will send home with Flomax, hydrocodone, Zofran.   Follow-up with urology on Monday.  Advised on strict return precautions.    [KM]    Clinical Course User Index [KM] Arlyn Dunning, PA-C       Based on review of vitals, medical screening exam, lab work and/or imaging, there does not appear to be an acute, emergent etiology for the patient's symptoms. Counseled pt on good return precautions and encouraged both PCP and ED follow-up as needed.  Prior to discharge, I also discussed incidental imaging findings with patient in detail and advised appropriate, recommended follow-up in detail.  Clinical Impression: 1. Kidney stone   2. Right lower quadrant abdominal pain  Disposition: Discharge  Prior to providing a prescription for a controlled substance, I independently reviewed the patient's recent prescription history on the [place] Controlled Substance Reporting System. The patient had no recent or regular prescriptions and was deemed appropriate for a brief, less than 3 day prescription of narcotic for acute analgesia.  This note was prepared with assistance of Conservation officer, historic buildingsDragon voice recognition software. Occasional wrong-word or sound-a-like substitutions may have occurred due to the inherent limitations of voice recognition software.   Final Clinical Impressions(s) / ED Diagnoses   Final diagnoses:  Right lower quadrant abdominal pain  Kidney stone    ED Discharge Orders         Ordered    ondansetron (ZOFRAN) 4 MG tablet  Every 6 hours     03/03/19 2147    tamsulosin (FLOMAX) 0.4 MG CAPS capsule  Daily     03/03/19 2147    HYDROcodone-acetaminophen (NORCO/VICODIN) 5-325 MG tablet  Every 4 hours PRN     03/03/19 2147           Jeral PinchMcLean, Arlana Canizales A, PA-C 03/03/19 2149    Melene PlanFloyd, Dan, DO 03/03/19 2154

## 2019-03-05 ENCOUNTER — Encounter (HOSPITAL_BASED_OUTPATIENT_CLINIC_OR_DEPARTMENT_OTHER): Payer: Self-pay | Admitting: Student

## 2019-03-05 ENCOUNTER — Emergency Department (HOSPITAL_BASED_OUTPATIENT_CLINIC_OR_DEPARTMENT_OTHER)
Admission: EM | Admit: 2019-03-05 | Discharge: 2019-03-05 | Disposition: A | Discharge status: Home / Self Care | Payer: BC Managed Care – PPO | Attending: Emergency Medicine | Admitting: Emergency Medicine

## 2019-03-05 ENCOUNTER — Other Ambulatory Visit: Payer: Self-pay

## 2019-03-05 DIAGNOSIS — R109 Unspecified abdominal pain: Secondary | ICD-10-CM | POA: Diagnosis present

## 2019-03-05 DIAGNOSIS — Z79899 Other long term (current) drug therapy: Secondary | ICD-10-CM | POA: Diagnosis not present

## 2019-03-05 DIAGNOSIS — N23 Unspecified renal colic: Secondary | ICD-10-CM | POA: Insufficient documentation

## 2019-03-05 DIAGNOSIS — M069 Rheumatoid arthritis, unspecified: Secondary | ICD-10-CM | POA: Diagnosis not present

## 2019-03-05 LAB — URINALYSIS, ROUTINE W REFLEX MICROSCOPIC
Bilirubin Urine: NEGATIVE
Glucose, UA: NEGATIVE mg/dL
Ketones, ur: NEGATIVE mg/dL
Nitrite: NEGATIVE
Protein, ur: 30 mg/dL — AB
Specific Gravity, Urine: 1.02 (ref 1.005–1.030)
pH: 6.5 (ref 5.0–8.0)

## 2019-03-05 LAB — URINALYSIS, MICROSCOPIC (REFLEX): RBC / HPF: >50 RBC/hpf (ref 0–5)

## 2019-03-05 MED ORDER — KETOROLAC TROMETHAMINE 15 MG/ML IJ SOLN
15.0000 mg | Freq: Once | INTRAMUSCULAR | Status: AC
  Administered 2019-03-05: 15 mg via INTRAVENOUS
  Filled 2019-03-05: qty 1

## 2019-03-05 MED ORDER — HYDROCODONE-ACETAMINOPHEN 5-325 MG PO TABS: 1.0000 | ORAL_TABLET | Freq: Four times a day (QID) | ORAL | 0 refills | Status: DC | PRN

## 2019-03-05 MED ORDER — IBUPROFEN 800 MG PO TABS: 800.0000 mg | ORAL_TABLET | Freq: Three times a day (TID) | ORAL | 0 refills | Status: AC | PRN

## 2019-03-05 MED ORDER — SODIUM CHLORIDE 0.9 % IV BOLUS
500.0000 mL | Freq: Once | INTRAVENOUS | Status: AC
  Administered 2019-03-05: 500 mL via INTRAVENOUS

## 2019-03-05 MED ORDER — MORPHINE SULFATE (PF) 4 MG/ML IV SOLN
4.0000 mg | Freq: Once | INTRAVENOUS | Status: AC
  Administered 2019-03-05: 4 mg via INTRAVENOUS
  Filled 2019-03-05: qty 1

## 2019-03-05 MED ORDER — ONDANSETRON HCL 4 MG/2ML IJ SOLN
4.0000 mg | Freq: Once | INTRAMUSCULAR | Status: AC
  Administered 2019-03-05: 4 mg via INTRAVENOUS
  Filled 2019-03-05: qty 2

## 2019-03-05 NOTE — ED Triage Notes (Signed)
Pt dx with kidney stone 2 days ago at [facility]. C/o right flank pain x 1.5 hours unrelieved by hydrocodone

## 2019-03-05 NOTE — Discharge Instructions (Signed)
You are seen in the emergency department for pain likely related to your kidney stone.  You were given medicines with improvement.  We are sending you home with additional hydrocodone to take as needed for severe pain.  Do not drive or operate heavy machinery with this medication as it is a controlled substance.  You not drink alcohol or take other sedating medicines with this medicine.  We are also sending you home with ibuprofen 800 mg, please take this every 8 hours starting tomorrow morning.  This may cause stomach upset, please be sure to take with food.  We have prescribed you new medication(s) today. Discuss the medications prescribed today with your pharmacist as they can have adverse effects and interactions with your other medicines including over the counter and prescribed medications. Seek medical evaluation if you start to experience new or abnormal symptoms after taking one of these medicines, seek care immediately if you start to experience difficulty breathing, feeling of your throat closing, facial swelling, or rash as these could be indications of a more serious allergic reaction  Please follow-up with urology within the next 2 days.  Return to the ER for new or worsening symptoms including but not limited to fever, pain not improved with medications, inability to keep fluids down, or any other concerns.

## 2019-03-05 NOTE — ED Provider Notes (Signed)
[facility] EMERGENCY DEPARTMENT Provider Note   CSN: [id number] Arrival date & time: 03/05/19  2021    History   Chief Complaint Chief Complaint  Patient presents with   Flank Pain    HPI Kathleen Alvarez is a 53 y.o. female with a history of narcolepsy, ADHD, & rheumatoid arthritis on MTX returns to the emergency department with complaints of flank/suprapubic pain that worsened this evening around 18:30. Patient was seen in the emergency department 5/29 ultimately was diagnosed with nephrolithiasis- 3 mm ureterovesical junction stone on CT w/ mild hydro & perinephritic stranding. Discharged home w/ Norco, zofran, and flomax. States she has had intermittent episodes of pain that last about 20 minutes since discharge which have been controlled w/ taking hydrocodone. She woke up feeling okay this AM and throughout the day, however around 18:30 developed severe pain to the R flank and suprapubic pressure. She states this is similar to pain that prompted last ER visit. She took her hydrocodone w/ onset without significant relief. Given lack of pain control returned to the ER. Has had continued urinary frequency.  Denies fever, chills, vomiting, or dysuria.     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      Home Medications    Prior to Admission medications   Medication Sig Start Date End Date Taking? Authorizing Provider  albuterol (PROVENTIL HFA;VENTOLIN HFA) 108 (90 BASE) MCG/ACT inhaler Inhale 2 puffs into the lungs every 6 (six) hours as needed. For exercise-induced bronchospasm    [provider]  amphetamine-dextroamphetamine (ADDERALL) 10 MG tablet Take 10 mg by mouth as needed.    [provider]  cholecalciferol (VITAMIN D) 1000 UNITS tablet Take 1,000 Units by mouth daily.    [provider]  fluconazole (DIFLUCAN) 150 MG tablet Take 2 as directed Patient not taking: Reported on 04/20/2018 11/12/17   Pollyann Savoyeveshwar, Shaili, MD  HUMIRA PEN 40 MG/0.4ML PNKT INJECT 1 PEN UNDER THE SKIN EVERY 14 DAYS. Patient taking differently: Inject 1 pen into the skin every 14 (fourteen) days.  01/26/19   Pollyann Savoyeveshwar, Shaili, MD  HYDROcodone-acetaminophen (NORCO/VICODIN) 5-325 MG tablet Take 1 tablet by mouth every 4 (four) hours as needed for up to 3 days for severe pain. 03/03/19 03/06/19  Ronnie DossMcLean, Kelly A, PA-C  ibuprofen (ADVIL,MOTRIN) 200 MG tablet Take 800 mg by mouth every 6 (six) hours as needed. For muscle soreness    [provider]  lisdexamfetamine (VYVANSE) 70 MG capsule Take 1 capsule (70 mg total) by mouth daily. 11/30/18   Dohmeier, Porfirio Mylararmen, MD  Multiple Vitamin (MULTIVITAMIN WITH MINERALS) TABS Take 1 tablet by mouth daily.    [provider]  ondansetron (ZOFRAN) 4 MG tablet Take 1 tablet (4 mg total) by mouth every 6 (six) hours. 03/03/19   Arlyn DunningMcLean, Kelly A, PA-C  tamsulosin (FLOMAX) 0.4 MG CAPS capsule Take 1 capsule (0.4 mg total) by mouth daily for 7 days. 03/03/19 03/10/19  Arlyn DunningMcLean, Kelly A, PA-C  TUBERCULIN SYR  1CC/27GX1/2" (B-D TB SYRINGE 1CC/27GX1/2") 27G X 1/2" 1 ML MISC Used as directed once per week 11/16/17   Pollyann Savoyeveshwar, Shaili, MD  venlafaxine (EFFEXOR) 37.5 MG tablet Take 37.5 mg by mouth daily.    [provider]    Family History Family History  Problem Relation Age of Onset   Colon cancer Father        deceased   Liver cancer Father [phone]       deceased     Social History Social History   Tobacco Use   Smoking status: Never Smoker   Smokeless tobacco: Never Used  Substance Use Topics   Alcohol use: No   Drug use: No     Allergies   Peg [polyethylene glycol]  Review of Systems Review of Systems  Constitutional: Negative for chills and fever.  Respiratory: Negative for shortness of breath.   Cardiovascular: Negative for chest pain.  Gastrointestinal: Positive for abdominal pain. Negative for blood in stool, constipation, diarrhea and vomiting.  Genitourinary: Positive for flank pain and frequency. Negative for dysuria, vaginal bleeding and vaginal discharge.  All other systems reviewed and are negative.   Physical Exam Updated Vital Signs BP 125/76 (BP Location: Right Arm)    Pulse 64    Temp 99.1 F (37.3 C) (Oral)    Resp 16    Ht 5\' 3"  (1.6 m)    Wt 64.4 kg    SpO2 100%    BMI 25.15 kg/m   Physical Exam Vitals signs and nursing note reviewed.  Constitutional:      General: She is in acute distress (mild appears uncomfortable).     Appearance: She is well-developed. She is not toxic-appearing.  HENT:     Head: Normocephalic and atraumatic.  Eyes:     General:        Right eye: No discharge.        Left eye: No discharge.     Conjunctiva/sclera: Conjunctivae normal.  Neck:     Musculoskeletal: Neck supple.  Cardiovascular:     Rate and Rhythm: Normal rate and regular rhythm.  Pulmonary:     Effort: Pulmonary effort is normal. No respiratory distress.     Breath sounds: Normal breath sounds. No wheezing, rhonchi or rales.  Abdominal:      General: There is no distension.     Palpations: Abdomen is soft.     Tenderness: There is no abdominal tenderness. There is no right CVA tenderness, left CVA tenderness, guarding or rebound.     Comments: No mcburneys point tenderness.   Skin:    General: Skin is warm and dry.     Findings: No rash.  Neurological:     Mental Status: She is alert.     Comments: Clear speech.   Psychiatric:        Behavior: Behavior normal.    ED Treatments / Results  Labs (all labs ordered are listed, but only abnormal results are displayed) Labs Reviewed - No data to display  EKG None  Radiology Ct Renal Stone Study  Result Date:  03/03/2019 CLINICAL DATA:  Flank pain EXAM: CT ABDOMEN AND PELVIS WITHOUT CONTRAST TECHNIQUE: Multidetector CT imaging of the abdomen and pelvis was performed following the standard protocol without IV contrast. COMPARISON:  None. FINDINGS: LOWER CHEST: There is no basilar pleural or apical pericardial effusion. HEPATOBILIARY: The hepatic contours and density are normal. There is no intra- or extrahepatic biliary dilatation. The gallbladder is normal. PANCREAS: The pancreatic parenchymal contours are normal and there is no ductal dilatation. There is no peripancreatic fluid collection. SPLEEN: Normal. ADRENALS/URINARY TRACT: --Adrenal glands: Normal. --Right kidney/ureter: There is a stone within the distal ureter measuring 2-3 mm at the right ureterovesical junction, causing mild hydroureter, no hydronephrosis and minimal perinephric stranding. --Left kidney/ureter: No hydronephrosis, nephroureterolithiasis, perinephric stranding or solid renal mass. --Urinary bladder: Normal for degree of distention STOMACH/BOWEL: --Stomach/Duodenum: There is no hiatal hernia or other gastric abnormality. The duodenal course and caliber are normal. --Small bowel: No dilatation or inflammation. --Colon: No focal abnormality. --Appendix: Normal. VASCULAR/LYMPHATIC: There is aortic atherosclerosis  without hemodynamically significant stenosis. No abdominal or pelvic lymphadenopathy. REPRODUCTIVE: Normal uterus.  No adnexal mass. MUSCULOSKELETAL. No bony spinal canal stenosis or focal osseous abnormality. OTHER: None. IMPRESSION: 1. Mild right obstructive uropathy with 3 mm ureterovesical junction stone causing mild hydroureter and perinephric stranding. 2. No other nephroureterolithiasis. 3. Mild aortic atherosclerosis (ICD10-I70.0). Electronically Signed   By: Deatra RobinsonKevin  Herman M.D.   On: 03/03/2019 21:30    Procedures Procedures (including critical care time)  Medications Ordered in ED Medications - No data to display   Initial Impression / Assessment and Plan / ED Course  I have reviewed the triage vital signs and the nursing notes.  Pertinent labs & imaging results that were available during my care of the patient were reviewed by me and considered in my medical decision making (see chart for details).    Patient returns to the ER w/ complaints of R flank/suprapubic pain. Patient was seen in the emergency department 5/29 3 mm ureterovesical junction stone on CT w/ mild hydro & perinephritic stranding, UA without signs of infection & renal function preserved. Today patient appears mildly uncomfortable, nontoxic, vitals WNL. Exam w/ no abdominal tenderness/peritoneal signs, patient states pain feels similar to the pain she has had associated w/ the stone, do not suspect appendicitis, cholecystitis, perf, or obstruction. Plan for recheck UA & Pain control.   UA w/ small leuks & few bacteria, nitrite negative, 6-10 squamous epithelial cells, not overly convincing for infection, will culture. Hematuria as to be expected w/ known nephrolithiasis.   21:36: RE-EVAL: Patient feeling significantly improved,pain completely resolved, feels comfortable w/ plan for discharge home at this time.  Will provide a few extra tablets of hydrocodone, she has 2 remaining tablets from her prior prescription.   [place] Cont[place] Sub[place] reporting System queried We will also place patient on 800 mg of ibuprofen to help with pain and to help with passing of stone. Strainer provided. Follow-up with urology. I discussed results, treatment plan, need for follow-up, and return precautions with the patient. Provided opportunity for questions, patient confirmed understanding and is in agreement with plan.   Findings and plan of care discussed with supervising physician Dr. Lockie Molacuratolo who is in agreement.   Final Clinical Impressions(s) / ED Diagnoses   Final diagnoses:  Ureteral colic    ED Discharge Orders         Ordered    HYDROcodone-acetaminophen (NORCO/VICODIN) 5-325 MG tablet  Every 6 hours PRN     03/05/19 2138  ibuprofen (ADVIL) 800 MG tablet  Every 8 hours PRN     03/05/19 2138           Cherly Anderson, PA-C 03/05/19 2141    Virgina Norfolk, DO 03/05/19 2256

## 2019-03-06 ENCOUNTER — Other Ambulatory Visit: Payer: Self-pay

## 2019-03-06 ENCOUNTER — Encounter (HOSPITAL_BASED_OUTPATIENT_CLINIC_OR_DEPARTMENT_OTHER): Payer: Self-pay | Admitting: Emergency Medicine

## 2019-03-06 ENCOUNTER — Emergency Department (HOSPITAL_BASED_OUTPATIENT_CLINIC_OR_DEPARTMENT_OTHER)
Admission: EM | Admit: 2019-03-06 | Discharge: 2019-03-06 | Disposition: A | Discharge status: Home / Self Care | Payer: BC Managed Care – PPO | Attending: Emergency Medicine | Admitting: Emergency Medicine

## 2019-03-06 DIAGNOSIS — N23 Unspecified renal colic: Secondary | ICD-10-CM | POA: Diagnosis not present

## 2019-03-06 DIAGNOSIS — Z79899 Other long term (current) drug therapy: Secondary | ICD-10-CM | POA: Insufficient documentation

## 2019-03-06 DIAGNOSIS — R1031 Right lower quadrant pain: Secondary | ICD-10-CM | POA: Diagnosis present

## 2019-03-06 HISTORY — DX: Rheumatoid arthritis, unspecified: M06.9

## 2019-03-06 LAB — URINE CULTURE: Culture: NO GROWTH

## 2019-03-06 MED ORDER — OXYCODONE-ACETAMINOPHEN 5-325 MG PO TABS
ORAL_TABLET | ORAL | Status: AC
  Filled 2019-03-06: qty 1

## 2019-03-06 MED ORDER — OXYCODONE-ACETAMINOPHEN 5-325 MG PO TABS
2.0000 | ORAL_TABLET | Freq: Once | ORAL | Status: AC
  Administered 2019-03-06: 2 via ORAL
  Filled 2019-03-06: qty 2

## 2019-03-06 MED ORDER — KETOROLAC TROMETHAMINE 60 MG/2ML IM SOLN
60.0000 mg | Freq: Once | INTRAMUSCULAR | Status: AC
  Administered 2019-03-06: 60 mg via INTRAMUSCULAR
  Filled 2019-03-06: qty 2

## 2019-03-06 NOTE — ED Provider Notes (Signed)
[facility] EMERGENCY DEPARTMENT Provider Note   CSN: [id number] Arrival date & time: 03/06/19  [id number]    History   Chief Complaint Chief Complaint  Patient presents with  . Flank Pain    HPI Kathleen Alvarez is a 53 y.o. female.     HPI Patient was diagnosed with 2-3 cm right ureteral stone 3 days ago by CT.  Was seen in the emergency department yesterday evening for ongoing pain.  States she woke this morning at 630 and the pain returned.  She is characterized the pain as sharp and in her right lower abdomen.  No nausea vomiting.  No fever or chills.  Has ongoing urinary frequency but no hematuria.  States she took 2 Norco this morning earlier this morning without resolution of her symptoms. Past Medical History:  Diagnosis Date  . Anxiety   . 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 RA (rheumatoid arthritis) [facility](HCC)     Patient Active Problem List   Diagnosis Date Noted  . Strain of calf muscle 03/17/2018  . On methotrexate therapy 09/20/2017  . Rheumatoid arthritis involving multiple sites with positive rheumatoid factor (HCC) 05/03/2017  . Narcolepsy 09/15/2016  . 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 CESAREAN SECTION     x2  . COLONOSCOPY  2009   family history of colon ca  . DILITATION & CURRETTAGE/HYSTROSCOPY WITH NOVASURE ABLATION  08/26/2012   Procedure: DILATATION & CURETTAGE/HYSTEROSCOPY WITH NOVASURE ABLATION;  Surgeon: Lenoard Adenichard J Taavon, MD;  Location: [facility];  Service: Gynecology;  Laterality: N/A;  . lipoma     . WISDOM TOOTH EXTRACTION       OB History   No obstetric history on file.      Home Medications    Prior to Admission medications   Medication Sig Start Date End Date Taking? Authorizing Provider  albuterol (PROVENTIL HFA;VENTOLIN HFA) 108 (90 BASE) MCG/ACT inhaler  Inhale 2 puffs into the lungs every 6 (six) hours as needed. For exercise-induced bronchospasm    [provider]  amphetamine-dextroamphetamine (ADDERALL) 10 MG tablet Take 10 mg by mouth as needed.    [provider]  cholecalciferol (VITAMIN D) 1000 UNITS tablet Take 1,000 Units by mouth daily.    [provider]  fluconazole (DIFLUCAN) 150 MG tablet Take 2 as directed Patient not taking: Reported on 04/20/2018 11/12/17   Pollyann Savoyeveshwar, Shaili, MD  HUMIRA PEN 40 MG/0.4ML PNKT INJECT 1 PEN UNDER THE SKIN EVERY 14 DAYS. Patient taking differently: Inject 1 pen into the skin every 14 (fourteen) days.  01/26/19   Pollyann Savoyeveshwar, Shaili, MD  HYDROcodone-acetaminophen (NORCO/VICODIN) 5-325 MG tablet Take 1 tablet by mouth every 4 (four) hours as needed for up to 3 days for severe pain. 03/03/19 03/06/19  Arlyn DunningMcLean, Kelly A, PA-C  HYDROcodone-acetaminophen (NORCO/VICODIN) 5-325 MG tablet Take 1-2 tablets by mouth every 6 (six) hours as needed. 03/05/19   Petrucelli, Samantha R, PA-C  ibuprofen (ADVIL) 800 MG tablet Take 1 tablet (800 mg total) by mouth every 8 (eight) hours as needed. 03/05/19   Petrucelli, Samantha R, PA-C  lisdexamfetamine (VYVANSE) 70 MG capsule Take 1 capsule (70 mg total) by mouth daily. 11/30/18   Dohmeier, Porfirio Mylararmen, MD  Multiple Vitamin (MULTIVITAMIN WITH MINERALS) TABS Take 1 tablet by mouth daily.    [provider]  ondansetron (ZOFRAN) 4 MG tablet Take 1 tablet (4 mg total) by mouth  every 6 (six) hours. 03/03/19   Arlyn Dunning, PA-C  tamsulosin (FLOMAX) 0.4 MG CAPS capsule Take 1 capsule (0.4 mg total) by mouth daily for 7 days. 03/03/19 03/10/19  Arlyn Dunning, PA-C  TUBERCULIN SYR 1CC/27GX1/2" (B-D TB SYRINGE 1CC/27GX1/2") 27G X 1/2" 1 ML MISC Used as directed once per week 11/16/17   Pollyann Savoy, MD  venlafaxine (EFFEXOR) 37.5 MG tablet Take 37.5 mg by mouth daily.    [provider]    Family History Family History  Problem Relation Age of  Onset  . Colon cancer Father        deceased  . Liver cancer Father 28       deceased     Social History Social History   Tobacco Use  . Smoking status: Never Smoker  . Smokeless tobacco: Never Used  Substance Use Topics  . Alcohol use: No  . Drug use: No     Allergies   Peg [polyethylene glycol]   Review of Systems Review of Systems  Constitutional: Negative for chills and fever.  Gastrointestinal: Positive for abdominal pain. Negative for constipation, diarrhea, nausea and vomiting.  Genitourinary: Positive for frequency. Negative for dysuria, flank pain and hematuria.  Musculoskeletal: Negative for back pain, myalgias and neck pain.  Skin: Negative for rash and wound.  Neurological: Negative for dizziness, weakness, light-headedness and numbness.  All other systems reviewed and are negative.    Physical Exam Updated Vital Signs BP 108/73 (BP Location: Right Arm)   Pulse 69   Temp 98.4 F (36.9 C) (Oral)   Resp 16   Ht 5\' 3"  (1.6 m)   Wt 64.4 kg   SpO2 94%   BMI 25.15 kg/m   Physical Exam Vitals signs and nursing note reviewed.  Constitutional:      Appearance: She is well-developed.  HENT:     Head: Normocephalic and atraumatic.  Eyes:     Pupils: Pupils are equal, round, and reactive to light.  Neck:     Musculoskeletal: Normal range of motion and neck supple.  Cardiovascular:     Rate and Rhythm: Normal rate and regular rhythm.  Pulmonary:     Effort: Pulmonary effort is normal.     Breath sounds: Normal breath sounds.  Abdominal:     General: Bowel sounds are normal.     Palpations: Abdomen is soft.     Tenderness: There is abdominal tenderness. There is no guarding or rebound.     Comments: Mild tenderness to palpation the right lower quadrant.  No rebound or guarding.  Musculoskeletal: Normal range of motion.        General: No tenderness.     Comments: No CVA tenderness bilaterally.  Skin:    General: Skin is warm and dry.      Findings: No erythema or rash.  Neurological:     General: No focal deficit present.     Mental Status: She is alert and oriented to person, place, and time.  Psychiatric:        Behavior: Behavior normal.      ED Treatments / Results  Labs (all labs ordered are listed, but only abnormal results are displayed) Labs Reviewed - No data to display  EKG None  Radiology No results found.  Procedures Procedures (including critical care time)  Medications Ordered in ED Medications  ketorolac (TORADOL) injection 60 mg (60 mg Intramuscular Given 03/06/19 0901)  oxyCODONE-acetaminophen (PERCOCET/ROXICET) 5-325 MG per tablet 2 tablet (2 tablets Oral Given  03/06/19 0901)     Initial Impression / Assessment and Plan / ED Course  I have reviewed the triage vital signs and the nursing notes.  Pertinent labs & imaging results that were available during my care of the patient were reviewed by me and considered in my medical decision making (see chart for details).        Patient symptoms have improved after IM Toradol and Percocet.  Requesting discharge home.  Low suspicion for infected stone.  Return precautions given.  Final Clinical Impressions(s) / ED Diagnoses   Final diagnoses:  Ureteral colic    ED Discharge Orders    None       Loren Racer, MD 03/06/19 1002

## 2019-03-06 NOTE — ED Triage Notes (Addendum)
Flank pain restarted this morning.  Took 2 vicodin at 7 am which has not improved her pain.  Pt seen several times for same.  Pt states "I guess Im really here for pain management"

## 2019-03-20 ENCOUNTER — Other Ambulatory Visit: Payer: Self-pay | Admitting: Neurology

## 2019-03-20 DIAGNOSIS — G47419 Narcolepsy without cataplexy: Secondary | ICD-10-CM

## 2019-03-21 MED ORDER — LISDEXAMFETAMINE DIMESYLATE 70 MG PO CAPS: 70.0000 mg | ORAL_CAPSULE | Freq: Every day | ORAL | 0 refills | Status: DC

## 2019-03-27 ENCOUNTER — Other Ambulatory Visit: Payer: Self-pay | Admitting: Rheumatology

## 2019-03-27 DIAGNOSIS — M0579 Rheumatoid arthritis with rheumatoid factor of multiple sites without organ or systems involvement: Secondary | ICD-10-CM

## 2019-03-27 NOTE — Telephone Encounter (Signed)
Last visit: 02/15/19 Next Visit 07/18/19  Labs: 03/03/19 Glucose 113 WBC 12.5, Neutro Abs 9.9 TB Gold: 06/09/18 Neg   Okay to refill per Dr. Estanislado Pandy

## 2019-06-15 ENCOUNTER — Other Ambulatory Visit: Payer: Self-pay | Admitting: Adult Health

## 2019-06-15 DIAGNOSIS — G47419 Narcolepsy without cataplexy: Secondary | ICD-10-CM

## 2019-06-15 MED ORDER — LISDEXAMFETAMINE DIMESYLATE 70 MG PO CAPS: 70.0000 mg | ORAL_CAPSULE | Freq: Every day | ORAL | 0 refills | Status: DC

## 2019-06-15 NOTE — Telephone Encounter (Signed)
Pt has called for a refill on her lisdexamfetamine (VYVANSE) 70 MG capsule [id number]

## 2019-06-15 NOTE — Telephone Encounter (Signed)
Dr. Brett Fairy can you refill for 90 days

## 2019-06-15 NOTE — Addendum Note (Signed)
Addended by: Minna Antis on: 06/15/2019 03:12 PM   Modules accepted: Orders

## 2019-06-19 ENCOUNTER — Other Ambulatory Visit: Payer: Self-pay | Admitting: Neurology

## 2019-06-19 DIAGNOSIS — G47419 Narcolepsy without cataplexy: Secondary | ICD-10-CM

## 2019-06-20 ENCOUNTER — Telehealth: Payer: Self-pay | Admitting: *Deleted

## 2019-06-20 ENCOUNTER — Other Ambulatory Visit: Payer: Self-pay | Admitting: Neurology

## 2019-06-20 DIAGNOSIS — G47419 Narcolepsy without cataplexy: Secondary | ICD-10-CM

## 2019-06-20 MED ORDER — LISDEXAMFETAMINE DIMESYLATE 70 MG PO CAPS: 70.0000 mg | ORAL_CAPSULE | Freq: Every day | ORAL | 0 refills | Status: DC

## 2019-06-20 NOTE — Telephone Encounter (Signed)
I have routed this request to Dr Dohmeier for review. The pt is due for the medication and [place] registry was verified.  

## 2019-06-20 NOTE — Telephone Encounter (Signed)
[facility] called and stated [facility] from last week was not received. Pt will be out tomorrow. I informed her I would call and check on it.  [facility] [place] and spoke with [place]. They do not have the Vyvanse 70 mg prescription. Last one from June.

## 2019-07-11 NOTE — Progress Notes (Signed)
Office Visit Note  Patient: Kathleen Alvarez             Date of Birth: 11/27/1965           MRN: [id number]             PCP: Brien Few, MD Referring: Brien Few, MD Visit Date: 07/25/2019 Occupation: @GUAROCC @  Subjective:  Medication monitoring    History of Present Illness: Kathleen Alvarez is a 53 y.o. female with history of seropositive rheumatoid arthritis.  She is on Humira 40 mg sq injections every 21 days. She has not had any recent rheumatoid arthritis flares.  She denies any joint pain or joint swelling at this time.  She denies any morning stiffness.   She is exercising on a regular basis.  She has no new concerns at this time.   Activities of Daily Living:  Patient reports morning stiffness for 0 minutes.   Patient Denies nocturnal pain.  Difficulty dressing/grooming: Denies Difficulty climbing stairs: Denies Difficulty getting out of chair: Denies Difficulty using hands for taps, buttons, cutlery, and/or writing: Denies  Review of Systems  Constitutional: Negative for fatigue.  HENT: Negative for mouth sores, mouth dryness and nose dryness.   Eyes: Negative for pain, itching, visual disturbance and dryness.  Respiratory: Negative for cough, hemoptysis, shortness of breath, wheezing and difficulty breathing.   Cardiovascular: Negative for chest pain, palpitations, hypertension and swelling in legs/feet.  Gastrointestinal: Negative for abdominal pain, blood in stool, constipation and diarrhea.  Endocrine: Negative for increased urination.  Genitourinary: Negative for difficulty urinating and painful urination.  Musculoskeletal: Negative for arthralgias, joint pain, joint swelling, myalgias, muscle weakness, morning stiffness, muscle tenderness and myalgias.  Skin: Positive for hair loss. Negative for color change, pallor, rash, nodules/bumps, skin tightness, ulcers and sensitivity to sunlight.  Allergic/Immunologic: Negative for susceptible to infections.   Neurological: Negative for dizziness, light-headedness, numbness, headaches, memory loss and weakness.  Hematological: Negative for bruising/bleeding tendency and swollen glands.  Psychiatric/Behavioral: Negative for depressed mood, confusion and sleep disturbance. The patient is not nervous/anxious.     PMFS History:  Patient Active Problem List   Diagnosis Date Noted  . Strain of calf muscle 03/17/2018  . On methotrexate therapy 09/20/2017  . Rheumatoid arthritis involving multiple sites with positive rheumatoid factor ([place]) 05/03/2017  . Narcolepsy 09/15/2016  . 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 Anxiety   . 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 RA (rheumatoid arthritis) (HCC)     Family History  Problem Relation Age of Onset  . Colon cancer Father        deceased  . Liver cancer Father 90       deceased    Past Surgical History:  Procedure Laterality Date  . CESAREAN SECTION     x2  . COLONOSCOPY  2009   family history of colon ca  . DILITATION & CURRETTAGE/HYSTROSCOPY WITH NOVASURE ABLATION  08/26/2012   Procedure: DILATATION & CURETTAGE/HYSTEROSCOPY WITH NOVASURE ABLATION;  Surgeon: Lovenia Kim, MD;  Location: [facility];  Service: Gynecology;  Laterality: N/A;  . lipoma     . WISDOM TOOTH EXTRACTION     Social History   Social History Narrative   Patient is employed.    Patient married with 2 children.      Immunization History  Administered Date(s) Administered  . Influenza, Seasonal, Injecte, Preservative Fre 08/31/2014  Objective: Vital Signs: BP 98/67 (BP Location: Left Arm, Patient Position: Sitting, Cuff Size: Normal)   Pulse 86   Resp 12   Ht 5\' 3"  (1.6 m)   Wt 150 lb (68 kg)   BMI 26.57 kg/m    Physical Exam Vitals signs and nursing note reviewed.  Constitutional:       Appearance: She is well-developed.  HENT:     Head: Normocephalic and atraumatic.  Eyes:     Conjunctiva/sclera: Conjunctivae normal.  Neck:     Musculoskeletal: Normal range of motion.  Cardiovascular:     Rate and Rhythm: Normal rate and regular rhythm.     Heart sounds: Normal heart sounds.  Pulmonary:     Effort: Pulmonary effort is normal.     Breath sounds: Normal breath sounds.  Abdominal:     General: Bowel sounds are normal.     Palpations: Abdomen is soft.  Lymphadenopathy:     Cervical: No cervical adenopathy.  Skin:    General: Skin is warm and dry.     Capillary Refill: Capillary refill takes less than 2 seconds.     Comments: Tinea versicolor present on upper back   Neurological:     Mental Status: She is alert and oriented to person, place, and time.  Psychiatric:        Behavior: Behavior normal.      Musculoskeletal Exam: C-spine, thoracic spine, and lumbar spine good ROM.  No midline spinal tenderness.  No SI joint tenderness.  Shoulder joints, elbow joints, wrist joints, MCPs, PIPs, and DIPs good ROM with no synovitis.  Hip joints, knee joints, ankle joints, MTPs, PIPs, and DIPs good ROM with no synovitis.  No warmth or effusion of knee joints.  No tenderness or swelling of ankle joints.    CDAI Exam: CDAI Score: 0.2  Patient Global: 1 mm; Provider Global: 1 mm Swollen: 0 ; Tender: 0  Joint Exam   No joint exam has been documented for this visit   There is currently no information documented on the homunculus. Go to the Rheumatology activity and complete the homunculus joint exam.  Investigation: No additional findings.  Imaging: No results found.  Recent Labs: Lab Results  Component Value Date   WBC 12.5 (H) 03/03/2019   HGB 14.9 03/03/2019   PLT 278 03/03/2019   NA 141 03/03/2019   K 3.9 03/03/2019   CL 102 03/03/2019   CO2 29 03/03/2019   GLUCOSE 113 (H) 03/03/2019   BUN 20 03/03/2019   CREATININE 0.89 03/03/2019   BILITOT 0.8  03/03/2019   ALKPHOS 78 03/03/2019   AST 30 03/03/2019   ALT 26 03/03/2019   PROT 7.6 03/03/2019   ALBUMIN 4.4 03/03/2019   CALCIUM 9.8 03/03/2019   GFRAA >60 03/03/2019   QFTBGOLDPLUS NEGATIVE 06/09/2018    Speciality Comments: No specialty comments available.  Procedures:  No procedures performed Allergies: Peg [polyethylene glycol]   Assessment / Plan:     Visit Diagnoses: Rheumatoid arthritis involving multiple sites with positive rheumatoid factor (HCC) - +RF, +CCP: She has no synovitis on exam.  She has not had any recent rheumatoid arthritis flares.  She has no joint pain or joint swelling at this time.  She has no morning stiffness.  She is clinically doing well on Humira 40 mg sq injections every 21 days. She has not had any new or worsening symptoms since spacing the dose of Humira.  She will continue on this current treatment regimen.  She was  advised to notify us if he develops increased joint pain or joint swelling.  She will follow-up in the office in 5 months.  High risk medication use -Humira 40 mg every 21 days.  Last TB gold negative 06/09/2018.  Due for TB gold today and will monitor yearly.  Future order placed today.  CBC and CMP drawn on 07/04/19. She will be due to update lab work in December and every 3 months.  Standing orders are in place.   - Plan: QuantiFERON-TB Gold Plus  Tinea versicolor: She has active tinea versicolor on her upper back.  She requested a refill of Diflucan.  Diflucan 300 mg once weekly for 2 weeks was sent to the pharmacy today.  She was advised to start using [facility] on a daily basis.  Other insomnia: She has been sleeping well at night.   Other fatigue: She has no fatigue at this time.   Other medical conditions are listed as follows:   Narcolepsy due to underlying condition without cataplexy  Attention deficit hyperactivity disorder (ADHD), predominantly inattentive type  Postmenopausal-her bone density done in August 2019 was  within normal limits.    Orders: Orders Placed This Encounter  Procedures  . QuantiFERON-TB Gold Plus   No orders of the defined types were placed in this encounter.    Follow-Up Instructions: Return in about 5 months (around 12/23/2019) for Rheumatoid arthritis.   Gearldine Bienenstock, PA-C   I examined and evaluated the patient with Sherron Ales PA.  Patient is clinically doing well.  She had no synovitis on examination.  She has been taking Humira every 3 weeks.  She will continue current regimen.  She is a still concerned about her weight gain.  Weight loss diet and exercise was discussed.  She does exercise on a regular basis.  The plan of care was discussed as noted above.  Pollyann Savoy, MD  Note - This record has been created using Animal nutritionist.  Chart creation errors have been sought, but may not always  have been located. Such creation errors do not reflect on  the standard of medical care.

## 2019-07-18 ENCOUNTER — Ambulatory Visit: Payer: BC Managed Care – PPO | Admitting: Rheumatology

## 2019-07-25 ENCOUNTER — Encounter: Payer: Self-pay | Admitting: Rheumatology

## 2019-07-25 ENCOUNTER — Other Ambulatory Visit: Payer: Self-pay

## 2019-07-25 ENCOUNTER — Ambulatory Visit (INDEPENDENT_AMBULATORY_CARE_PROVIDER_SITE_OTHER): Payer: BC Managed Care – PPO | Admitting: Rheumatology

## 2019-07-25 VITALS — BP 98/67 | HR 86 | Resp 12 | Ht 63.0 in | Wt 150.0 lb

## 2019-07-25 DIAGNOSIS — G47429 Narcolepsy in conditions classified elsewhere without cataplexy: Secondary | ICD-10-CM

## 2019-07-25 DIAGNOSIS — M0579 Rheumatoid arthritis with rheumatoid factor of multiple sites without organ or systems involvement: Secondary | ICD-10-CM | POA: Diagnosis not present

## 2019-07-25 DIAGNOSIS — Z79899 Other long term (current) drug therapy: Secondary | ICD-10-CM

## 2019-07-25 DIAGNOSIS — F900 Attention-deficit hyperactivity disorder, predominantly inattentive type: Secondary | ICD-10-CM

## 2019-07-25 DIAGNOSIS — R5383 Other fatigue: Secondary | ICD-10-CM | POA: Diagnosis not present

## 2019-07-25 DIAGNOSIS — G4709 Other insomnia: Secondary | ICD-10-CM

## 2019-07-25 DIAGNOSIS — B360 Pityriasis versicolor: Secondary | ICD-10-CM

## 2019-07-25 DIAGNOSIS — Z780 Asymptomatic menopausal state: Secondary | ICD-10-CM

## 2019-07-25 MED ORDER — FLUCONAZOLE 150 MG PO TABS: ORAL_TABLET | ORAL | 0 refills | Status: DC

## 2019-07-25 NOTE — Patient Instructions (Signed)
Standing Labs We placed an order today for your standing lab work.    Please come back and get your standing labs in December and every 3 months    TB gold is overdue    We have open lab daily Monday through Thursday from 8:30-12:30 PM and 1:30-4:30 PM and Friday from 8:30-12:30 PM and 1:30-4:00 PM at the office of Dr. Bo Merino.   You may experience shorter wait times on Monday and Friday afternoons. The office is located at [address] [place], [place], [place] [address] No appointment is necessary.   Labs are drawn by [facility].  You may receive a bill from [facility] for your lab work.  If you wish to have your labs drawn at another location, please call the office 24 hours in advance to send orders.  If you have any questions regarding directions or hours of operation,  please call [phone].   Just as a reminder please drink plenty of water prior to coming for your lab work. Thanks!

## 2019-09-14 ENCOUNTER — Other Ambulatory Visit: Payer: Self-pay | Admitting: Adult Health

## 2019-09-14 DIAGNOSIS — G47419 Narcolepsy without cataplexy: Secondary | ICD-10-CM

## 2019-09-14 MED ORDER — LISDEXAMFETAMINE DIMESYLATE 70 MG PO CAPS: 70.0000 mg | ORAL_CAPSULE | Freq: Every day | ORAL | 0 refills | Status: DC

## 2019-09-14 NOTE — Telephone Encounter (Signed)
Pt is requesting a refill of lisdexamfetamine (VYVANSE) 70 MG capsule , to [id number] [place], [place] - [address]

## 2019-09-14 NOTE — Telephone Encounter (Signed)
Drug registry checked Vyvanse last fill 06-20-19 #90.

## 2019-09-25 ENCOUNTER — Other Ambulatory Visit: Payer: Self-pay | Admitting: Rheumatology

## 2019-09-25 DIAGNOSIS — M0579 Rheumatoid arthritis with rheumatoid factor of multiple sites without organ or systems involvement: Secondary | ICD-10-CM

## 2019-09-25 NOTE — Telephone Encounter (Signed)
Last Visit: 07/25/2019 Next Visit: 12/19/2019 Labs: 07/04/2019 TB Gold: 06/09/2018 negative   Attempted to contact patient and left message on machine to advise patient she is due to update labs (at a [facility] or [facility]). Advised patient to call with preferred location so orders can be released.   Okay to refill 30 day supply, per Dr. Estanislado Pandy.

## 2019-10-03 ENCOUNTER — Telehealth: Payer: Self-pay | Admitting: Rheumatology

## 2019-10-03 ENCOUNTER — Ambulatory Visit: Payer: BC Managed Care – PPO | Admitting: Neurology

## 2019-10-03 ENCOUNTER — Encounter: Payer: Self-pay | Admitting: Neurology

## 2019-10-03 DIAGNOSIS — G47419 Narcolepsy without cataplexy: Secondary | ICD-10-CM | POA: Diagnosis not present

## 2019-10-03 DIAGNOSIS — Z79899 Other long term (current) drug therapy: Secondary | ICD-10-CM

## 2019-10-03 MED ORDER — LISDEXAMFETAMINE DIMESYLATE 70 MG PO CAPS: 70.0000 mg | ORAL_CAPSULE | Freq: Every day | ORAL | 0 refills | Status: DC

## 2019-10-03 MED ORDER — TRAZODONE HCL 50 MG PO TABS: 50.0000 mg | ORAL_TABLET | Freq: Every evening | ORAL | 1 refills | Status: DC | PRN

## 2019-10-03 NOTE — Addendum Note (Signed)
Addended by: Earnestine Mealing on: 10/03/2019 03:13 PM   Modules accepted: Orders

## 2019-10-03 NOTE — Telephone Encounter (Signed)
Lab orders released for [facility].  

## 2019-10-03 NOTE — Patient Instructions (Signed)
Trazodone tablets What is this medicine? TRAZODONE (TRAZ oh done) is used to treat depression. This medicine may be used for other purposes; ask your health care provider or pharmacist if you have questions. COMMON BRAND NAME(S): Desyrel What should I tell my health care provider before I take this medicine? They need to know if you have any of these conditions:  attempted suicide or thinking about it  bipolar disorder  bleeding problems  glaucoma  heart disease, or previous heart attack  irregular heart beat  kidney or liver disease  low levels of sodium in the blood  an unusual or allergic reaction to trazodone, other medicines, foods, dyes or preservatives  pregnant or trying to get pregnant  breast-feeding How should I use this medicine? Take this medicine by mouth with a glass of water. Follow the directions on the prescription label. Take this medicine shortly after a meal or a light snack. Take your medicine at regular intervals. Do not take your medicine more often than directed. Do not stop taking this medicine suddenly except upon the advice of your doctor. Stopping this medicine too quickly may cause serious side effects or your condition may worsen. A special MedGuide will be given to you by the pharmacist with each prescription and refill. Be sure to read this information carefully each time. Talk to your pediatrician regarding the use of this medicine in children. Special care may be needed. Overdosage: If you think you have taken too much of this medicine contact a poison control center or emergency room at once. NOTE: This medicine is only for you. Do not share this medicine with others. What if I miss a dose? If you miss a dose, take it as soon as you can. If it is almost time for your next dose, take only that dose. Do not take double or extra doses. What may interact with this medicine? Do not take this medicine with any of the following  medications:  certain medicines for fungal infections like fluconazole, itraconazole, ketoconazole, posaconazole, voriconazole  cisapride  dronedarone  linezolid  MAOIs like Carbex, Eldepryl, Marplan, Nardil, and Parnate  mesoridazine  methylene blue (injected into a vein)  pimozide  saquinavir  thioridazine This medicine may also interact with the following medications:  alcohol  antiviral medicines for HIV or AIDS  aspirin and aspirin-like medicines  barbiturates like phenobarbital  certain medicines for blood pressure, heart disease, irregular heart beat  certain medicines for depression, anxiety, or psychotic disturbances  certain medicines for migraine headache like almotriptan, eletriptan, frovatriptan, naratriptan, rizatriptan, sumatriptan, zolmitriptan  certain medicines for seizures like carbamazepine and phenytoin  certain medicines for sleep  certain medicines that treat or prevent blood clots like dalteparin, enoxaparin, warfarin  digoxin  fentanyl  lithium  NSAIDS, medicines for pain and inflammation, like ibuprofen or naproxen  other medicines that prolong the QT interval (cause an abnormal heart rhythm) like dofetilide  rasagiline  supplements like St. John's wort, kava kava, valerian  tramadol  tryptophan This list may not describe all possible interactions. Give your health care provider a list of all the medicines, herbs, non-prescription drugs, or dietary supplements you use. Also tell them if you smoke, drink alcohol, or use illegal drugs. Some items may interact with your medicine. What should I watch for while using this medicine? Tell your doctor if your symptoms do not get better or if they get worse. Visit your doctor or health care professional for regular checks on your progress. Because it may take   several weeks to see the full effects of this medicine, it is important to continue your treatment as prescribed by your  doctor. Patients and their families should watch out for new or worsening thoughts of suicide or depression. Also watch out for sudden changes in feelings such as feeling anxious, agitated, panicky, irritable, hostile, aggressive, impulsive, severely restless, overly excited and hyperactive, or not being able to sleep. If this happens, especially at the beginning of treatment or after a change in dose, call your health care professional. You may get drowsy or dizzy. Do not drive, use machinery, or do anything that needs mental alertness until you know how this medicine affects you. Do not stand or sit up quickly, especially if you are an older patient. This reduces the risk of dizzy or fainting spells. Alcohol may interfere with the effect of this medicine. Avoid alcoholic drinks. This medicine may cause dry eyes and blurred vision. If you wear contact lenses you may feel some discomfort. Lubricating drops may help. See your eye doctor if the problem does not go away or is severe. Your mouth may get dry. Chewing sugarless gum, sucking hard candy and drinking plenty of water may help. Contact your doctor if the problem does not go away or is severe. What side effects may I notice from receiving this medicine? Side effects that you should report to your doctor or health care professional as soon as possible:  allergic reactions like skin rash, itching or hives, swelling of the face, lips, or tongue  elevated mood, decreased need for sleep, racing thoughts, impulsive behavior  confusion  fast, irregular heartbeat  feeling faint or lightheaded, falls  feeling agitated, angry, or irritable  loss of balance or coordination  painful or prolonged erections  restlessness, pacing, inability to keep still  suicidal thoughts or other mood changes  tremors  trouble sleeping  seizures  unusual bleeding or bruising Side effects that usually do not require medical attention (report to your doctor  or health care professional if they continue or are bothersome):  change in sex drive or performance  change in appetite or weight  constipation  headache  muscle aches or pains  nausea This list may not describe all possible side effects. Call your doctor for medical advice about side effects. You may report side effects to FDA at 1-800-FDA-1088. Where should I keep my medicine? Keep out of the reach of children. Store at room temperature between 15 and 30 degrees C (59 to 86 degrees F). Protect from light. Keep container tightly closed. Throw away any unused medicine after the expiration date. NOTE: This sheet is a summary. It may not cover all possible information. If you have questions about this medicine, talk to your doctor, pharmacist, or health care provider.  2020 Elsevier/Gold Standard (2018-09-13 11:46:46)  

## 2019-10-03 NOTE — Progress Notes (Signed)
PATIENT: Kathleen Alvarez DOB: Nov 11, 1965  REASON FOR VISIT: follow up HISTORY FROM: patient  HISTORY OF PRESENT ILLNESS:  10/03/19: This is a revisit for Kathleen Alvarez, a meanwhile 53 year old , caucasian professor at a local college, a physically active Individual, currently on Vyvanse for treatment of narcolepsy related hypersomnia.  She reports ongoing effect from Vyvanse, keeping her alert and safe and productive. She is now in perimenopause.  She is going through insulin resistance - her rheumatologist follows her for rheumatoid arthritis.  She has developed abdominal fat, and is frustrated about hat. She began craving carbs.  We are dicussing sleep aids at [facility]. Trazodone stands out for a patient with dry eyes and dry mouth, as it doesn't have these side effect.  During the pandemic her teaching has been on hold.    Kathleen Alvarez is a 53 year old female with a history of narcolepsy.  She returns today for follow-up.  She is currently on Vyvanse.  She reports that she tolerates this well.  This continues to work well for her.  She denies falling asleep at work or while driving.  Denies any heart palpitations or depression.  She states that she does have an Adderall prescription but she only uses this if she has an episode where she cannot stay awake.  Reports that she only uses this approximately 5 times a year.  She denies any cataplectic events.  She returns today for evaluation.   HISTORY Kathleen Alvarez is a 53 y.o. female and  een here as a revisit for medication refill in the treatment of narcolepsy.   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  She had this condition since college years and was first seen in this practice in 2007. For the treatment of for hypersomnia she was started on Provigil but within 6 months she had noted some irritability and anger on that medication, this  in addition was more complicated as the patient became pregnant.  Provigil was therefore discontinued and she was started on Adderall , Tolerated much better for about a year she was eventually changed to Vyvanse in 2008 . In April 2016 she was diagnosed with rheumatoid arthritis, takes MTX. this condition deepens her fatigue further.  She continues to do well on Vyvanse after over 7 years. She still sleeps 10-12 hours over a 24 hour period. She is not snoring. No apnea.   Interval history from 09/15/2016. I have the pleasure of seeing Dr. [name], PhD, for routine revisit. The patient introduced to tolerate Vyvanse which she will have taken for 10 years next year. Refills today. No evidence of hypertension, hypersomnia, hyperglycemia.  Interval history from 20 September 2017, I have the pleasure of seeing Dr. 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  Her degree as an exercise physiology. She endorsed the Epworth sleepiness scale today still at 17 points without medicine and under the influence of Vyvanse around 11 points. We continue Vyvanse.  She has been on the medication now for 10 years. She has been sleepy all her adult life, since college, perhaps late years of high school.   REVIEW OF SYSTEMS: Out of a complete 14 system review of symptoms, the patient complains only of the following symptoms, and all other reviewed systems are negative. How likely are you to doze in the following situations:  0 = not likely, 1 = slight chance, 2 = moderate chance, 3 = high chance  Sitting and Reading?3 Watching Television? 2 Sitting inactive in a public place (theater or meeting)? 2 Lying down in the afternoon when circumstances permit?33 Sitting and talking to someone? 1 Sitting quietly after lunch without alcohol?2 In a car, while stopped for a few minutes in traffic?2 As a passenger in a car for an hour without a break?3  Total =  18/ 24 (!)     ALLERGIES: Allergies  Allergen Reactions  . Peg [Polyethylene Glycol] Itching and Nausea And Vomiting    Used when had colonoscopy prep    HOME MEDICATIONS: Outpatient Medications Prior to Visit  Medication Sig Dispense Refill  . Adalimumab (HUMIRA PEN) 40 MG/0.4ML PNKT Inject 40 mg into the skin every 21 ( twenty-one) days. 1 each 0  . albuterol (PROVENTIL HFA;VENTOLIN HFA) 108 (90 BASE) MCG/ACT inhaler Inhale 2 puffs into the lungs every 6 (six) hours as needed. For exercise-induced bronchospasm    . amphetamine-dextroamphetamine (ADDERALL) 10 MG tablet Take 10 mg by mouth as needed.    . cholecalciferol (VITAMIN D) 1000 UNITS tablet Take 5,000 Units by mouth.     . fluconazole (DIFLUCAN) 150 MG tablet Take 2 tablets (300 mg total) once weekly for 2 weeks. 4 tablet 0  . ibuprofen (ADVIL) 800 MG tablet Take 1 tablet (800 mg total) by mouth every 8 (eight) hours as needed. 12 tablet 0  . lisdexamfetamine (VYVANSE) 70 MG capsule Take 1 capsule (70 mg total) by mouth daily. 90 capsule 0  . Multiple Vitamin (MULTIVITAMIN WITH MINERALS) TABS Take 1 tablet by mouth daily.    . Omega-3 Fatty Acids (FISH OIL PO) Take by mouth daily.    Marland Kitchen venlafaxine (EFFEXOR) 37.5 MG tablet Take 37.5 mg by mouth daily.     No facility-administered medications prior to visit.    PAST MEDICAL HISTORY: Past Medical History:  Diagnosis Date  . Anxiety   . 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 RA (rheumatoid arthritis) (HCC)     PAST SURGICAL HISTORY: Past Surgical History:  Procedure Laterality Date  . CESAREAN SECTION     x2  . COLONOSCOPY  2009   family history of colon ca  . DILITATION & CURRETTAGE/HYSTROSCOPY WITH NOVASURE ABLATION  08/26/2012   Procedure: DILATATION & CURETTAGE/HYSTEROSCOPY WITH NOVASURE ABLATION;  Surgeon: Lenoard Aden, MD;  Location: [facility];  Service: Gynecology;  Laterality: N/A;  . lipoma     . WISDOM TOOTH EXTRACTION      FAMILY  HISTORY: Family History  Problem Relation Age of Onset  . Colon cancer Father        deceased  . Liver cancer Father 74       deceased     SOCIAL HISTORY: Social History   Socioeconomic History  . Marital status: Married    Spouse name: [name]   . Number of children: 2  . Years of education: 12+  . Highest education level: Not on file  Occupational History  . Occupation: Photographer: [employer]  Tobacco Use  . Smoking status: Never Smoker  . Smokeless tobacco: Never Used  Substance and Sexual Activity  . Alcohol use: No  . Drug use: No  . Sexual activity: Not on file  Other Topics Concern  . Not on file  Social History Narrative   Patient is employed.    Patient married with 2 children.  Social Determinants of Health   Financial Resource Strain:   . Difficulty of Paying Living Expenses: Not on file  Food Insecurity:   . Worried About Charity fundraiser in the Last Year: Not on file  . Ran Out of Food in the Last Year: Not on file  Transportation Needs:   . Lack of Transportation (Medical): Not on file  . Lack of Transportation (Non-Medical): Not on file  Physical Activity:   . Days of Exercise per Week: Not on file  . Minutes of Exercise per Session: Not on file  Stress:   . Feeling of Stress : Not on file  Social Connections:   . Frequency of Communication with Friends and Family: Not on file  . Frequency of Social Gatherings with Friends and Family: Not on file  . Attends Religious Services: Not on file  . Active Member of Clubs or Organizations: Not on file  . Attends Archivist Meetings: Not on file  . Marital Status: Not on file  Intimate Partner Violence:   . Fear of Current or Ex-Partner: Not on file  . Emotionally Abused: Not on file  . Physically Abused: Not on file  . Sexually Abused: Not on file      PHYSICAL EXAM  Vitals:   10/03/19 1049  BP: 117/73  Pulse: 81  Temp: (!) 97.5 F (36.4 C)  Weight: 155  lb (70.3 kg)  Height: 5\' 3"  (1.6 m)   Body mass index is 27.46 kg/m.  Generalized: Well developed, in no acute distress   Skin-  Dry and warm. No edema, no rash.  Posture- erect. BMI 27. Regular heart rate, no murmur,  LCTA.   Neurological examination  Mentation: No loss of taste or smell- Alert oriented to time, place, history taking. Follows all commands her speech is clear and fluent Cranial nerves :  Extraocular movements were full, visual field were full on confrontational test. Facial sensation and strength were normal. Head turning and shoulder shrug were normal and symmetric. Motor:  full strength of all 4 extremities. No cog-wheeling, no spatsicity-   Good grip strength,  Hands have been stiff through rheumatoid arthritis.  Good symmetric motor tone is noted throughout.  Sensory:  intact to soft touch  and vibration,on all 4 extremities. No evidence of extinction is noted.  Coordination:   Gait and station:  Normal. Good balance, turns with 3 steps, no limp. Straight posture.    DIAGNOSTIC DATA (LABS, IMAGING, TESTING) - I reviewed patient records, labs, notes, testing and imaging myself where available.  Lab Results  Component Value Date   WBC 12.5 (H) 03/03/2019   HGB 14.9 03/03/2019   HCT 44.8 03/03/2019   MCV 91.4 03/03/2019   PLT 278 03/03/2019      Component Value Date/Time   NA 141 03/03/2019 1930   NA 144 01/30/2019 1638   K 3.9 03/03/2019 1930   CL 102 03/03/2019 1930   CO2 29 03/03/2019 1930   GLUCOSE 113 (H) 03/03/2019 1930   BUN 20 03/03/2019 1930   BUN 19 01/30/2019 1638   CREATININE 0.89 03/03/2019 1930   CREATININE 0.87 09/22/2018 1210   CALCIUM 9.8 03/03/2019 1930   PROT 7.6 03/03/2019 1930   PROT 6.5 01/30/2019 1638   ALBUMIN 4.4 03/03/2019 1930   ALBUMIN 4.5 01/30/2019 1638   AST 30 03/03/2019 1930   ALT 26 03/03/2019 1930   ALKPHOS 78 03/03/2019 1930   BILITOT 0.8 03/03/2019 1930   BILITOT 0.4 01/30/2019  1638   GFRNONAA >60  03/03/2019 1930   GFRNONAA 78 09/22/2018 1210   GFRAA >60 03/03/2019 1930   GFRAA 90 09/22/2018 1210     ASSESSMENT AND PLAN 53 y.o. year old female  has a past medical history of Anxiety, 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.00) (09/19/2014), and RA (rheumatoid arthritis) (HCC). here with:  1.  Narcolepsy without cataplexy , for many years treated on Vyvanse.  2. Hormonal changes, perimenopausal insulin resistance and sleep disturbance- will try trazodone.   The patient will continue on Vyvanse and Adderall.  The patient is concerned that her authorization for Vyvanse will expire and she will run out of medication before they will refill it.  I advised that we will call the pharmacy and try to do a preauthorization.    She voiced understanding.  She will follow-up in 1 year or sooner if needed.  I spent 25 minutes with the patient. 50% of this time was spent discussing her medication.  Melvyn Novasarmen Wray Goehring, MD   10/03/2019, 11:25 AM [facility] [address], [address] [place], [place] [address] [phone]

## 2019-10-03 NOTE — Telephone Encounter (Signed)
Patient left a voicemail requesting her labwork orders be sent to [facility] on [address].  Patient states she is planning to go this week.

## 2019-10-18 ENCOUNTER — Telehealth: Payer: Self-pay | Admitting: Rheumatology

## 2019-10-18 DIAGNOSIS — Z79899 Other long term (current) drug therapy: Secondary | ICD-10-CM

## 2019-10-18 DIAGNOSIS — Z111 Encounter for screening for respiratory tuberculosis: Secondary | ICD-10-CM

## 2019-10-18 NOTE — Telephone Encounter (Signed)
Lab orders released for [facility].  

## 2019-10-18 NOTE — Telephone Encounter (Signed)
Patient going to [facility] on [address] for labs. Please release orders.

## 2019-10-22 LAB — CBC WITH DIFFERENTIAL/PLATELET
Basophils Absolute: 0.1 10*3/uL (ref 0.0–0.2)
Basos: 1 %
EOS (ABSOLUTE): 0.1 10*3/uL (ref 0.0–0.4)
Eos: 1 %
Hematocrit: 45.4 % (ref 34.0–46.6)
Hemoglobin: 15.2 g/dL (ref 11.1–15.9)
Immature Grans (Abs): 0 10*3/uL (ref 0.0–0.1)
Immature Granulocytes: 0 %
Lymphocytes Absolute: 2.4 10*3/uL (ref 0.7–3.1)
Lymphs: 42 %
MCH: 29.9 pg (ref 26.6–33.0)
MCHC: 33.5 g/dL (ref 31.5–35.7)
MCV: 89 fL (ref 79–97)
Monocytes Absolute: 0.6 10*3/uL (ref 0.1–0.9)
Monocytes: 11 %
Neutrophils Absolute: 2.6 10*3/uL (ref 1.4–7.0)
Neutrophils: 45 %
Platelets: 347 10*3/uL (ref 150–450)
RBC: 5.09 x10E6/uL (ref 3.77–5.28)
RDW: 11.7 % (ref 11.7–15.4)
WBC: 5.8 10*3/uL (ref 3.4–10.8)

## 2019-10-22 LAB — CMP14+EGFR
ALT: 21 IU/L (ref 0–32)
AST: 28 IU/L (ref 0–40)
Albumin/Globulin Ratio: 2.3 — ABNORMAL HIGH (ref 1.2–2.2)
Albumin: 4.5 g/dL (ref 3.8–4.9)
Alkaline Phosphatase: 83 IU/L (ref 39–117)
BUN/Creatinine Ratio: 16 (ref 9–23)
BUN: 13 mg/dL (ref 6–24)
Bilirubin Total: 0.6 mg/dL (ref 0.0–1.2)
CO2: 27 mmol/L (ref 20–29)
Calcium: 10.2 mg/dL (ref 8.7–10.2)
Chloride: 101 mmol/L (ref 96–106)
Creatinine, Ser: 0.81 mg/dL (ref 0.57–1.00)
GFR calc Af Amer: 97 mL/min/{1.73_m2} (ref >59)
GFR calc non Af Amer: 84 mL/min/{1.73_m2} (ref >59)
Globulin, Total: 2 g/dL (ref 1.5–4.5)
Glucose: 99 mg/dL (ref 65–99)
Potassium: 5.1 mmol/L (ref 3.5–5.2)
Sodium: 139 mmol/L (ref 134–144)
Total Protein: 6.5 g/dL (ref 6.0–8.5)

## 2019-10-22 LAB — QUANTIFERON-TB GOLD PLUS
QuantiFERON Mitogen Value: >10 IU/mL
QuantiFERON Nil Value: 0.07 IU/mL
QuantiFERON TB1 Ag Value: 0.07 IU/mL
QuantiFERON TB2 Ag Value: 0.07 IU/mL
QuantiFERON-TB Gold Plus: NEGATIVE

## 2019-11-24 ENCOUNTER — Other Ambulatory Visit: Payer: Self-pay | Admitting: Neurology

## 2019-11-27 ENCOUNTER — Other Ambulatory Visit: Payer: Self-pay | Admitting: Neurology

## 2019-11-27 MED ORDER — TRAZODONE HCL 50 MG PO TABS: 50.0000 mg | ORAL_TABLET | Freq: Every evening | ORAL | 1 refills | Status: DC | PRN

## 2019-12-11 NOTE — Progress Notes (Signed)
Office Visit Note  Patient: Kathleen Alvarez             Date of Birth: [birth year]           MRN: [id number]             PCP: Brien Few, MD Referring: Brien Few, MD Visit Date: 12/19/2019 Occupation: @GUAROCC @  Subjective:  Medication monitoring.   History of Present Illness: Kathleen Alvarez is a 54 y.o. female with history of rheumatoid arthritis.  She states her rheumatoid arthritis is very well controlled with Humira every 21 days.  She denies any joint swelling or joint pain.  She has been active.  She is concerned about the weight gain.  She believes is related to her postmenopausal symptoms.  She has been since going to natural therapist.  Activities of Daily Living:  Patient reports morning stiffness for 0 minutes.   Patient Denies nocturnal pain.  Difficulty dressing/grooming: Denies Difficulty climbing stairs: Denies Difficulty getting out of chair: Denies Difficulty using hands for taps, buttons, cutlery, and/or writing: Denies  Review of Systems  Constitutional: Positive for weight gain. Negative for fatigue, night sweats and weight loss.  HENT: Negative for mouth sores, trouble swallowing, trouble swallowing, mouth dryness and nose dryness.   Eyes: Negative for pain, redness, itching, visual disturbance and dryness.  Respiratory: Negative for cough, shortness of breath and difficulty breathing.   Cardiovascular: Negative for chest pain, palpitations, hypertension, irregular heartbeat and swelling in legs/feet.  Gastrointestinal: Negative for blood in stool, constipation and diarrhea.  Endocrine: Negative for increased urination.  Genitourinary: Negative for difficulty urinating, painful urination and vaginal dryness.  Musculoskeletal: Negative for arthralgias, joint pain, joint swelling, myalgias, muscle weakness, morning stiffness, muscle tenderness and myalgias.  Skin: Positive for rash. Negative for color change, hair loss, skin tightness, ulcers and sensitivity  to sunlight.  Allergic/Immunologic: Negative for susceptible to infections.  Neurological: Negative for dizziness, numbness, headaches, memory loss, night sweats and weakness.  Hematological: Negative for bruising/bleeding tendency and swollen glands.  Psychiatric/Behavioral: Negative for depressed mood, confusion and sleep disturbance. The patient is not nervous/anxious.     PMFS History:  Patient Active Problem List   Diagnosis Date Noted  . High risk medication use 12/19/2019  . Strain of calf muscle 03/17/2018  . Rheumatoid arthritis involving multiple sites with positive rheumatoid factor ([place]) 05/03/2017  . Narcolepsy 09/15/2016  . 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 Anxiety   . 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 RA (rheumatoid arthritis) (HCC)     Family History  Problem Relation Age of Onset  . Colon cancer Father        deceased  . Liver cancer Father 61       deceased   . Gout Brother    Past Surgical History:  Procedure Laterality Date  . CESAREAN SECTION     x2  . COLONOSCOPY  2009   family history of colon ca  . DILITATION & CURRETTAGE/HYSTROSCOPY WITH NOVASURE ABLATION  08/26/2012   Procedure: DILATATION & CURETTAGE/HYSTEROSCOPY WITH NOVASURE ABLATION;  Surgeon: Lovenia Kim, MD;  Location: [facility];  Service: Gynecology;  Laterality: N/A;  . lipoma     . WISDOM TOOTH EXTRACTION     Social History   Social History Narrative   Patient is employed.    Patient married with 2 children.      Immunization  History  Administered Date(s) Administered  . Influenza, Seasonal, Injecte, Preservative Fre 08/31/2014  . Moderna SARS-COVID-2 Vaccination 12/14/2019     Objective: Vital Signs: BP 105/68 (BP Location: Left Arm, Patient Position: Sitting, Cuff Size: Normal)   Pulse 83   Resp 12   Ht  5\' 3"  (1.6 m)   Wt 156 lb 3.2 oz (70.9 kg)   BMI 27.67 kg/m    Physical Exam Vitals and nursing note reviewed.  Constitutional:      Appearance: She is well-developed.  HENT:     Head: Normocephalic and atraumatic.  Eyes:     Conjunctiva/sclera: Conjunctivae normal.  Cardiovascular:     Rate and Rhythm: Normal rate and regular rhythm.     Heart sounds: Normal heart sounds.  Pulmonary:     Effort: Pulmonary effort is normal.     Breath sounds: Normal breath sounds.  Abdominal:     General: Bowel sounds are normal.     Palpations: Abdomen is soft.  Musculoskeletal:     Cervical back: Normal range of motion.  Lymphadenopathy:     Cervical: No cervical adenopathy.  Skin:    General: Skin is warm and dry.     Capillary Refill: Capillary refill takes less than 2 seconds.     Findings: Rash present.     Comments: Hypopigmented macular lesions are noted on her back consistent with tinea versicolor.  Neurological:     Mental Status: She is alert and oriented to person, place, and time.  Psychiatric:        Behavior: Behavior normal.      Musculoskeletal Exam: C-spine, thoracic and lumbar spine with good range of motion.  Shoulder joints, elbow joints, wrist joints, MCPs PIPs and DIPs in good range of motion with no synovitis.  She had good range of motion of her hip joints, knee joints, ankles, MTPs and PIPs.  CDAI Exam: CDAI Score: 0.4  Patient Global: 2 mm; Provider Global: 2 mm Swollen: 0 ; Tender: 0  Joint Exam 12/19/2019   No joint exam has been documented for this visit   There is currently no information documented on the homunculus. Go to the Rheumatology activity and complete the homunculus joint exam.  Investigation: No additional findings.  Imaging: No results found.  Recent Labs: Lab Results  Component Value Date   WBC 5.8 10/19/2019   HGB 15.2 10/19/2019   PLT 347 10/19/2019   NA 139 10/19/2019   K 5.1 10/19/2019   CL 101 10/19/2019   CO2 27  10/19/2019   GLUCOSE 99 10/19/2019   BUN 13 10/19/2019   CREATININE 0.81 10/19/2019   BILITOT 0.6 10/19/2019   ALKPHOS 83 10/19/2019   AST 28 10/19/2019   ALT 21 10/19/2019   PROT 6.5 10/19/2019   ALBUMIN 4.5 10/19/2019   CALCIUM 10.2 10/19/2019   GFRAA 97 10/19/2019   QFTBGOLDPLUS Negative 10/19/2019    Speciality Comments: No specialty comments available.  Procedures:  No procedures performed Allergies: Peg [polyethylene glycol]   Assessment / Plan:     Visit Diagnoses: Rheumatoid arthritis involving multiple sites with positive rheumatoid factor (HCC) - +RF, +CCP.  Patient had no synovitis on examination.  She will continue Humira at 21 days.  High risk medication use - Humira 40 mg every 21 days.  Her labs were normal in January.  She will get labs in April and every 3 months to monitor for drug toxicity.  Tinea versicolor-we will call in prescription for Diflucan.  Other fatigue-she  continues to experience fatigue due to insomnia and narcolepsy.  Other insomnia-she has been trying trazodone but it is not very effective.  Narcolepsy due to underlying condition without cataplexy  Attention deficit hyperactivity disorder (ADHD), predominantly inattentive type  Weight gain-she has gained weight in the last few months.  She is uncertain about the etiology.  She states her thyroid function has been changed.  She has been watching her diet and exercising.  Postmenopausal - her bone density done in August 2019 was within normal limits.  Orders: No orders of the defined types were placed in this encounter.  Meds ordered this encounter  Medications  . fluconazole (DIFLUCAN) 150 MG tablet    Sig: Take 2 tablets (300 mg total) once weekly for 2 weeks.    Dispense:  4 tablet    Refill:  0     Follow-Up Instructions: Return in about 5 months (around 05/20/2020) for Rheumatoid arthritis.   Pollyann Savoy, MD  Note - This record has been created using Animal nutritionist.    Chart creation errors have been sought, but may not always  have been located. Such creation errors do not reflect on  the standard of medical care.

## 2019-12-14 ENCOUNTER — Ambulatory Visit: Payer: BC Managed Care – PPO | Attending: Internal Medicine

## 2019-12-14 DIAGNOSIS — Z23 Encounter for immunization: Secondary | ICD-10-CM

## 2019-12-14 NOTE — Progress Notes (Signed)
   Covid-19 Vaccination Clinic  Name:  Kathleen Alvarez    MRN: [id number] DOB: 20-Feb-1966  12/14/2019  [name] was observed post Covid-19 immunization for 15 minutes without incident. She was provided with Vaccine Information Sheet and instruction to access the V-Safe system.   [name] was instructed to call 911 with any severe reactions post vaccine: Marland Kitchen Difficulty breathing  . Swelling of face and throat  . A fast heartbeat  . A bad rash all over body  . Dizziness and weakness   Immunizations Administered    Name Date Dose VIS Date Route   Moderna COVID-19 Vaccine 12/14/2019  1:45 PM 0.5 mL 09/05/2019 Intramuscular   Manufacturer: Moderna   Lot: [id number]   NDC: 88502-774-12

## 2019-12-19 ENCOUNTER — Ambulatory Visit: Payer: BC Managed Care – PPO | Admitting: Rheumatology

## 2019-12-19 ENCOUNTER — Other Ambulatory Visit: Payer: Self-pay

## 2019-12-19 ENCOUNTER — Encounter: Payer: Self-pay | Admitting: Rheumatology

## 2019-12-19 VITALS — BP 105/68 | HR 83 | Resp 12 | Ht 63.0 in | Wt 156.2 lb

## 2019-12-19 DIAGNOSIS — R635 Abnormal weight gain: Secondary | ICD-10-CM

## 2019-12-19 DIAGNOSIS — B360 Pityriasis versicolor: Secondary | ICD-10-CM

## 2019-12-19 DIAGNOSIS — G4709 Other insomnia: Secondary | ICD-10-CM

## 2019-12-19 DIAGNOSIS — M0579 Rheumatoid arthritis with rheumatoid factor of multiple sites without organ or systems involvement: Secondary | ICD-10-CM | POA: Diagnosis not present

## 2019-12-19 DIAGNOSIS — R5383 Other fatigue: Secondary | ICD-10-CM

## 2019-12-19 DIAGNOSIS — Z780 Asymptomatic menopausal state: Secondary | ICD-10-CM

## 2019-12-19 DIAGNOSIS — Z79899 Other long term (current) drug therapy: Secondary | ICD-10-CM | POA: Insufficient documentation

## 2019-12-19 DIAGNOSIS — F900 Attention-deficit hyperactivity disorder, predominantly inattentive type: Secondary | ICD-10-CM

## 2019-12-19 DIAGNOSIS — G47429 Narcolepsy in conditions classified elsewhere without cataplexy: Secondary | ICD-10-CM

## 2019-12-19 MED ORDER — FLUCONAZOLE 150 MG PO TABS: ORAL_TABLET | ORAL | 0 refills | Status: DC

## 2019-12-19 NOTE — Patient Instructions (Signed)
Standing Labs We placed an order today for your standing lab work.    Please come back and get your standing labs in April and every 3 months   We have open lab daily Monday through Thursday from 8:30-12:30 PM and 1:30-4:30 PM and Friday from 8:30-12:30 PM and 1:30-4:00 PM at the office of Dr. Jeanell Mangan.   You may experience shorter wait times on Monday and Friday afternoons. The office is located at [address], [address], [place], [place] [address] No appointment is necessary.   Labs are drawn by [facility].  You may receive a bill from [facility] for your lab work.  If you wish to have your labs drawn at another location, please call the office 24 hours in advance to send orders.  If you have any questions regarding directions or hours of operation,  please call [phone].   Just as a reminder please drink plenty of water prior to coming for your lab work. Thanks!   

## 2020-01-16 ENCOUNTER — Ambulatory Visit: Payer: BC Managed Care – PPO | Attending: Family

## 2020-01-16 DIAGNOSIS — Z23 Encounter for immunization: Secondary | ICD-10-CM

## 2020-01-16 NOTE — Progress Notes (Signed)
   Covid-19 Vaccination Clinic  Name:  Kathleen Alvarez    MRN: [id number] DOB: 1965/12/23  01/16/2020  [name] was observed post Covid-19 immunization for 15 minutes without incident. She was provided with Vaccine Information Sheet and instruction to access the V-Safe system.   [name] was instructed to call 911 with any severe reactions post vaccine: Marland Kitchen Difficulty breathing  . Swelling of face and throat  . A fast heartbeat  . A bad rash all over body  . Dizziness and weakness   Immunizations Administered    Name Date Dose VIS Date Route   Moderna COVID-19 Vaccine 01/16/2020  2:40 PM 0.5 mL 09/05/2019 Intramuscular   Manufacturer: Moderna   Lot: [id number]   NDC: 01410-301-31

## 2020-03-05 ENCOUNTER — Other Ambulatory Visit: Payer: Self-pay | Admitting: Rheumatology

## 2020-03-05 DIAGNOSIS — M0579 Rheumatoid arthritis with rheumatoid factor of multiple sites without organ or systems involvement: Secondary | ICD-10-CM

## 2020-03-05 NOTE — Telephone Encounter (Signed)
Last Visit: 12/19/2019 Next Visit: 05/21/2020 Labs: 10/19/2019 CBC and CMP WNL.  TB Gold: 10/19/2019 Neg   Current Dose per office note 12/19/2019: Humira 40 mg every 21 days  Patient advised she is due to update labs. Patient will come 03/08/2020 to update labs.

## 2020-03-15 ENCOUNTER — Other Ambulatory Visit: Payer: Self-pay | Admitting: *Deleted

## 2020-03-15 DIAGNOSIS — Z79899 Other long term (current) drug therapy: Secondary | ICD-10-CM

## 2020-03-16 LAB — COMPLETE METABOLIC PANEL WITH GFR
AG Ratio: 2 (calc) (ref 1.0–2.5)
ALT: 29 U/L (ref 6–29)
AST: 26 U/L (ref 10–35)
Albumin: 4.3 g/dL (ref 3.6–5.1)
Alkaline phosphatase (APISO): 74 U/L (ref 37–153)
BUN: 15 mg/dL (ref 7–25)
CO2: 30 mmol/L (ref 20–32)
Calcium: 9.9 mg/dL (ref 8.6–10.4)
Chloride: 103 mmol/L (ref 98–110)
Creat: 0.82 mg/dL (ref 0.50–1.05)
GFR, Est African American: 95 mL/min/{1.73_m2} (ref >=60)
GFR, Est Non African American: 82 mL/min/{1.73_m2} (ref >=60)
Globulin: 2.2 g/dL (calc) (ref 1.9–3.7)
Glucose, Bld: 98 mg/dL (ref 65–99)
Potassium: 4.5 mmol/L (ref 3.5–5.3)
Sodium: 141 mmol/L (ref 135–146)
Total Bilirubin: 0.5 mg/dL (ref 0.2–1.2)
Total Protein: 6.5 g/dL (ref 6.1–8.1)

## 2020-03-16 LAB — CBC WITH DIFFERENTIAL/PLATELET
Absolute Monocytes: 602 cells/uL (ref 200–950)
Basophils Absolute: 91 cells/uL (ref 0–200)
Basophils Relative: 1.3 %
Eosinophils Absolute: 133 cells/uL (ref 15–500)
Eosinophils Relative: 1.9 %
HCT: 42.7 % (ref 35.0–45.0)
Hemoglobin: 14.6 g/dL (ref 11.7–15.5)
Lymphs Abs: 2709 cells/uL (ref 850–3900)
MCH: 29.7 pg (ref 27.0–33.0)
MCHC: 34.2 g/dL (ref 32.0–36.0)
MCV: 87 fL (ref 80.0–100.0)
MPV: 10.7 fL (ref 7.5–12.5)
Monocytes Relative: 8.6 %
Neutro Abs: 3465 cells/uL (ref 1500–7800)
Neutrophils Relative %: 49.5 %
Platelets: 357 10*3/uL (ref 140–400)
RBC: 4.91 10*6/uL (ref 3.80–5.10)
RDW: 11.9 % (ref 11.0–15.0)
Total Lymphocyte: 38.7 %
WBC: 7 10*3/uL (ref 3.8–10.8)

## 2020-03-18 ENCOUNTER — Other Ambulatory Visit: Payer: Self-pay | Admitting: Adult Health

## 2020-03-18 ENCOUNTER — Telehealth: Payer: Self-pay | Admitting: Neurology

## 2020-03-18 DIAGNOSIS — G47419 Narcolepsy without cataplexy: Secondary | ICD-10-CM

## 2020-03-18 MED ORDER — LISDEXAMFETAMINE DIMESYLATE 70 MG PO CAPS: 70.0000 mg | ORAL_CAPSULE | Freq: Every day | ORAL | 0 refills | Status: DC

## 2020-03-18 NOTE — Telephone Encounter (Signed)
Patient LVM requesting refill for vyvanse sent to [facility].   I am helping with VM only.

## 2020-03-18 NOTE — Telephone Encounter (Signed)
Per [place] registry, patient last filled this on 12/15/2019 Vyvanse 70 Mg Capsule #90.00 for 90 day supply. Pending appt 10/03/20. Will send refill to Dr. Vickey Huger to e-verify.

## 2020-03-18 NOTE — Addendum Note (Signed)
Addended by: Geronimo Running A on: 03/18/2020 02:00 PM   Modules accepted: Orders

## 2020-03-18 NOTE — Telephone Encounter (Signed)
Pt has called for a refill on her lisdexamfetamine (VYVANSE) 70 MG capsule [phone]

## 2020-03-18 NOTE — Telephone Encounter (Signed)
Dr. Lucia Gaskins e-scribed at 4:25 pm. Says receipt confirmed by pharmacy. I also notified pt via other phone encounter as she called [place] this afternoon.

## 2020-03-18 NOTE — Telephone Encounter (Signed)
Note this was already e-scribed by Dr. Vickey Huger today at 4:25 pm.

## 2020-03-18 NOTE — Telephone Encounter (Signed)
Pt is up to date on her appts and is due for a refill on vyvanse. [place] Controlled Substance Registry checked and is appropriate.

## 2020-04-23 ENCOUNTER — Other Ambulatory Visit: Payer: Self-pay | Admitting: Rheumatology

## 2020-04-23 DIAGNOSIS — M0579 Rheumatoid arthritis with rheumatoid factor of multiple sites without organ or systems involvement: Secondary | ICD-10-CM

## 2020-04-24 NOTE — Telephone Encounter (Signed)
Last Visit: 12/19/2019 Next Visit: 05/21/2020 Labs: 03/15/2020 CBC and CMP WNL TB Gold: 10/19/2019   Current Dose per office note 12/19/2019: Humira 40 mg every 21 days  Okay to refill per Dr. Corliss Skains

## 2020-05-09 NOTE — Progress Notes (Signed)
Office Visit Note  Patient: Kathleen Alvarez             Date of Birth: January 30, 1966           MRN: [id number]             PCP: [name], MD Referring: [name], MD Visit Date: 05/21/2020 Occupation: @GUAROCC @  Subjective:  Medication monitoring.   History of Present Illness: Kathleen Alvarez is a 54 y.o. female with history of rheumatoid arthritis.  She states she has been doing well on Humira.  She has not noticed any joint pain or joint swelling.  She denies any morning stiffness.  She is only concerned about the weight gain.  She has been exercising on a regular basis.  Activities of Daily Living:  Patient reports morning stiffness for 0 minutes.   Patient Denies nocturnal pain.  Difficulty dressing/grooming: Denies Difficulty climbing stairs: Denies Difficulty getting out of chair: Denies Difficulty using hands for taps, buttons, cutlery, and/or writing: Denies  Review of Systems  Constitutional: Negative for fatigue.  HENT: Positive for mouth dryness. Negative for mouth sores and nose dryness.   Eyes: Negative for itching and dryness.  Respiratory: Negative for shortness of breath and difficulty breathing.   Cardiovascular: Negative for chest pain and palpitations.  Gastrointestinal: Negative for blood in stool, constipation and diarrhea.  Endocrine: Negative for increased urination.  Genitourinary: Negative for difficulty urinating.  Musculoskeletal: Negative for arthralgias, joint pain, joint swelling, myalgias, morning stiffness, muscle tenderness and myalgias.  Skin: Negative for color change, rash and redness.  Allergic/Immunologic: Negative for susceptible to infections.  Neurological: Negative for dizziness, numbness, headaches, memory loss and weakness.  Hematological: Negative for bruising/bleeding tendency.  Psychiatric/Behavioral: Negative for confusion.    PMFS History:  Patient Active Problem List   Diagnosis Date Noted  . High risk medication use  12/19/2019  . Strain of calf muscle 03/17/2018  . Rheumatoid arthritis involving multiple sites with positive rheumatoid factor (HCC) 05/03/2017  . Narcolepsy 09/15/2016  . 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 Anxiety   . 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 RA (rheumatoid arthritis) (HCC)     Family History  Problem Relation Age of Onset  . Colon cancer Father        deceased  . Liver cancer Father 53       deceased   . Gout Brother    Past Surgical History:  Procedure Laterality Date  . CESAREAN SECTION     x2  . COLONOSCOPY  2009   family history of colon ca  . DILITATION & CURRETTAGE/HYSTROSCOPY WITH NOVASURE ABLATION  08/26/2012   Procedure: DILATATION & CURETTAGE/HYSTEROSCOPY WITH NOVASURE ABLATION;  Surgeon: Lenoard Aden, MD;  Location: [facility];  Service: Gynecology;  Laterality: N/A;  . lipoma     . WISDOM TOOTH EXTRACTION     Social History   Social History Narrative   Patient is employed.    Patient married with 2 children.      Immunization History  Administered Date(s) Administered  . Influenza, Seasonal, Injecte, Preservative Fre 08/31/2014  . Moderna SARS-COVID-2 Vaccination 12/14/2019, 01/16/2020     Objective: Vital Signs: BP 108/76 (BP Location: Left Arm, Patient Position: Sitting, Cuff Size: Normal)   Pulse 89   Resp 14   Ht 5\' 3"  (1.6 m)   Wt 155 lb 9.6 oz (70.6 kg)  BMI 27.56 kg/m    Physical Exam Vitals and nursing note reviewed.  Constitutional:      Appearance: She is well-developed.  HENT:     Head: Normocephalic and atraumatic.  Eyes:     Conjunctiva/sclera: Conjunctivae normal.  Cardiovascular:     Rate and Rhythm: Normal rate and regular rhythm.     Heart sounds: Normal heart sounds.  Pulmonary:     Effort: Pulmonary effort is normal.     Breath sounds: Normal  breath sounds.  Abdominal:     General: Bowel sounds are normal.     Palpations: Abdomen is soft.  Musculoskeletal:     Cervical back: Normal range of motion.  Lymphadenopathy:     Cervical: No cervical adenopathy.  Skin:    General: Skin is warm and dry.     Capillary Refill: Capillary refill takes less than 2 seconds.  Neurological:     Mental Status: She is alert and oriented to person, place, and time.  Psychiatric:        Behavior: Behavior normal.      Musculoskeletal Exam: C-spine thoracic and lumbar spine with good range of motion.  Shoulder joints, elbow joints, wrist joints, MCPs PIPs and DIPs with good range of motion with no synovitis.  Hip joints, knee joints, ankles, MTPs and PIPs were in good range of motion with no synovitis.  CDAI Exam: CDAI Score: 0  Patient Global: 0 mm; Provider Global: 0 mm Swollen: 0 ; Tender: 0  Joint Exam 05/21/2020   No joint exam has been documented for this visit   There is currently no information documented on the homunculus. Go to the Rheumatology activity and complete the homunculus joint exam.  Investigation: No additional findings.  Imaging: No results found.  Recent Labs: Lab Results  Component Value Date   WBC 7.0 03/15/2020   HGB 14.6 03/15/2020   PLT 357 03/15/2020   NA 141 03/15/2020   K 4.5 03/15/2020   CL 103 03/15/2020   CO2 30 03/15/2020   GLUCOSE 98 03/15/2020   BUN 15 03/15/2020   CREATININE 0.82 03/15/2020   BILITOT 0.5 03/15/2020   ALKPHOS 83 10/19/2019   AST 26 03/15/2020   ALT 29 03/15/2020   PROT 6.5 03/15/2020   ALBUMIN 4.5 10/19/2019   CALCIUM 9.9 03/15/2020   GFRAA 95 03/15/2020   QFTBGOLDPLUS Negative 10/19/2019    Speciality Comments: No specialty comments available.  Procedures:  No procedures performed Allergies: Peg [polyethylene glycol]   Assessment / Plan:     Visit Diagnoses: Rheumatoid arthritis involving multiple sites with positive rheumatoid factor (HCC) - +RF, +CCP.   She denies any joint pain or joint swelling.  She denies any stiffness.  She is doing very well on Humira.  She had a flare when she tried to come off Humira in the past.  She wants to stay on the current dosage for right now.- Plan: XR Hand 2 View Right, XR Hand 2 View Left, XR Foot 2 Views Right, XR Foot 2 Views Left hand x-ray showed juxta-articular osteopenia.  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  No erosive changes were noted.  High risk medication use - Humira 40 mg every 21 days.  Her labs have been stable.  Her next labs will be in September and every 3 months to monitor for drug toxicity.  Other insomnia-good sleep hygiene was discussed.  Other fatigue  Narcolepsy due to underlying condition without cataplexy  Attention deficit hyperactivity  disorder (ADHD), predominantly inattentive type  Postmenopausal - her bone density done in August 2019 was within normal limits.  Weight gain-weight loss and intermittent fasting was discussed.  She is fully vaccinated against COVID-19.  I discussed that COVID-19 booster has been approved.  Information was placed in the AVS and reviewed with her.  I also advised in case she develops COVID-19 infection she will be a candidate for monoclonal antibody infusion.  Orders: Orders Placed This Encounter  Procedures  . XR Hand 2 View Right  . XR Hand 2 View Left  . XR Foot 2 Views Right  . XR Foot 2 Views Left   No orders of the defined types were placed in this encounter.     Follow-Up Instructions: Return in about 5 months (around 10/21/2020) for Rheumatoid arthritis.   Pollyann Savoy, MD  Note - This record has been created using Animal nutritionist.  Chart creation errors have been sought, but may not always  have been located. Such creation errors do not reflect on  the standard of medical care.

## 2020-05-21 ENCOUNTER — Ambulatory Visit: Payer: Self-pay

## 2020-05-21 ENCOUNTER — Encounter: Payer: Self-pay | Admitting: Rheumatology

## 2020-05-21 ENCOUNTER — Other Ambulatory Visit: Payer: Self-pay

## 2020-05-21 ENCOUNTER — Ambulatory Visit: Payer: BC Managed Care – PPO | Admitting: Rheumatology

## 2020-05-21 VITALS — BP 108/76 | HR 89 | Resp 14 | Ht 63.0 in | Wt 155.6 lb

## 2020-05-21 DIAGNOSIS — M0579 Rheumatoid arthritis with rheumatoid factor of multiple sites without organ or systems involvement: Secondary | ICD-10-CM | POA: Diagnosis not present

## 2020-05-21 DIAGNOSIS — R5383 Other fatigue: Secondary | ICD-10-CM

## 2020-05-21 DIAGNOSIS — Z79899 Other long term (current) drug therapy: Secondary | ICD-10-CM

## 2020-05-21 DIAGNOSIS — G4709 Other insomnia: Secondary | ICD-10-CM

## 2020-05-21 DIAGNOSIS — F900 Attention-deficit hyperactivity disorder, predominantly inattentive type: Secondary | ICD-10-CM

## 2020-05-21 DIAGNOSIS — R635 Abnormal weight gain: Secondary | ICD-10-CM

## 2020-05-21 DIAGNOSIS — G47429 Narcolepsy in conditions classified elsewhere without cataplexy: Secondary | ICD-10-CM

## 2020-05-21 DIAGNOSIS — Z780 Asymptomatic menopausal state: Secondary | ICD-10-CM

## 2020-05-21 NOTE — Patient Instructions (Addendum)
COVID-19 vaccine recommendations:   COVID-19 vaccine is recommended for everyone (unless you are allergic to a vaccine component), even if you are on a medication that suppresses your immune system.   If you are on Methotrexate, Cellcept (mycophenolate), Rinvoq, Xeljanz, and Olumiant- hold the medication for 1 week after each vaccine. Hold Methotrexate for 2 weeks after the single dose COVID-19 vaccine.   If you are on Orencia subcutaneous injection - hold medication one week prior to and one week after the first COVID-19 vaccine dose (only).   If you are on Orencia IV infusions- time vaccination administration so that the first COVID-19 vaccination will occur four weeks after the infusion and postpone the subsequent infusion by one week.   If you are on Cyclophosphamide or Rituxan infusions please contact your doctor prior to receiving the COVID-19 vaccine.   Do not take Tylenol or ant anti-inflammatory medications (NSAIDs) 24 hours prior to the COVID-19 vaccination.   There is no direct evidence about the efficacy of the COVID-19 vaccine in individuals who are on medications that suppress the immune system.   Even if you are fully vaccinated, and you are on any medications that suppress your immune system, please continue to wear a mask, maintain at least six feet social distance and practice hand hygiene.   If you develop a COVID-19 infection, please contact your PCP or our office to determine if you need antibody infusion.  The booster vaccine is now available for immunocompromised patients. It is advised that if you had Pfizer vaccine you should get Pfizer booster.  If you had a Moderna vaccine then you should get a Moderna booster. Johnson and Johnson does not have a booster vaccine at this time.  Please see the following web sites for updated information.    https://[email or web address]/[email or web address]/0/Files/COVID-19-Vaccination-Patient-Resources.[email or web address]  [email or web address]://[email or web address]/About-Us/[email or web address]-Releases/ID/1159  Standing Labs We placed an order today for your standing lab work.   Please have your standing labs drawn in September and every 3 months  If possible, please have your labs drawn 2 weeks prior to your appointment so that the provider can discuss your results at your appointment.  We have open lab daily Monday through Thursday from 8:30-12:30 PM and 1:30-4:30 PM and Friday from 8:30-12:30 PM and 1:30-4:00 PM at the office of Dr. Zoya Sprecher, [facility].   Please be advised, patients with office appointments requiring lab work will take precedents over walk-in lab work.  If possible, please come for your lab work on Monday and Friday afternoons, as you may experience shorter wait times. The office is located at [address], [address], [place], [place] [address] No appointment is necessary.   Labs are drawn by [facility]. Please bring your co-pay at the time of your lab draw.  You may receive a bill from [facility] for your lab work.  If you wish to have your labs drawn at another location, please call the office 24 hours in advance to send orders.  If you have any questions regarding directions or hours of operation,  please call [phone].   As a reminder, please drink plenty of water prior to coming for your lab work. Thanks!  

## 2020-06-04 ENCOUNTER — Encounter: Payer: Self-pay | Admitting: Neurology

## 2020-06-06 ENCOUNTER — Telehealth: Payer: Self-pay | Admitting: Pharmacy Technician

## 2020-06-06 NOTE — Telephone Encounter (Signed)
Submitted a Prior Authorization request to [facility] for HUMIRA via fax. Will update once we receive a response.   PA# [id number]

## 2020-06-17 ENCOUNTER — Other Ambulatory Visit: Payer: Self-pay | Admitting: Neurology

## 2020-06-17 DIAGNOSIS — G47419 Narcolepsy without cataplexy: Secondary | ICD-10-CM

## 2020-06-17 MED ORDER — LISDEXAMFETAMINE DIMESYLATE 70 MG PO CAPS: 70.0000 mg | ORAL_CAPSULE | Freq: Every day | ORAL | 0 refills | Status: DC

## 2020-06-25 ENCOUNTER — Other Ambulatory Visit: Payer: Self-pay | Admitting: Rheumatology

## 2020-06-25 DIAGNOSIS — M0579 Rheumatoid arthritis with rheumatoid factor of multiple sites without organ or systems involvement: Secondary | ICD-10-CM

## 2020-06-25 NOTE — Telephone Encounter (Signed)
Last Visit: 05/21/2020 Next Visit: 10/22/2020 Labs: 03/15/2020 CBC and CMP WNL TB Gold: 10/19/2019 Neg   Current Dose per office note 05/21/2020: Humira 40 mg every 21 days  DX: Rheumatoid arthritis involving multiple sites with positive rheumatoid factor

## 2020-08-13 ENCOUNTER — Encounter: Payer: Self-pay | Admitting: Rheumatology

## 2020-08-13 ENCOUNTER — Telehealth: Payer: Self-pay | Admitting: Rheumatology

## 2020-08-13 NOTE — Telephone Encounter (Signed)
Attempted to contact the patient and left message for patient to call the office.  

## 2020-08-13 NOTE — Telephone Encounter (Signed)
Her renal function has been WNL.  Ok to take ibuprofen or aleve OTC to relieve pain and inflammation.

## 2020-08-13 NOTE — Telephone Encounter (Signed)
Patient advised her renal function has been WNL.  Ok to take ibuprofen or aleve OTC to relieve pain and inflammation. Patient expressed understanding.

## 2020-08-13 NOTE — Telephone Encounter (Signed)
I attempted to call the patient to recommend increasing the dose of humira from every 21 days to every 14 days until she sees Dr. Corliss Skains in January 2022.   I can also send in a prednisone taper starting at 20 mg tapering by 5 mg every 4 days.   If her joint pain and swelling persists or worsens please advise the patient to notify us and we will schedule a sooner follow up visit.

## 2020-08-13 NOTE — Telephone Encounter (Signed)
Patient having a lot of swelling with right hand that started Sunday evening. Patient states it is red, and warm to the touch. Please call to discuss.

## 2020-08-13 NOTE — Telephone Encounter (Signed)
Patient advised Sherron Ales, PA-C, recommend increasing the dose of humira from every 21 days to every 14 days until she sees Dr. Corliss Skains in January 2022.   Patient advised Ladona Ridgel can also send in a prednisone taper starting at 20 mg tapering by 5 mg every 4 days.   If her joint pain and swelling persists or worsens please advise the patient to notify us and we will schedule a sooner follow up visit.   Patient states she would increase Humira dosing to every 14 days. Patient states she would like to know if there is an alternative to the prednisone. Patient would like to avoid taking that as it causes her agitation, anger and bloating. Please advise.

## 2020-08-13 NOTE — Telephone Encounter (Signed)
Sherron Ales, PA-C attempted to contact the patient. See my chart message note for details.

## 2020-08-19 ENCOUNTER — Telehealth: Payer: Self-pay | Admitting: Rheumatology

## 2020-08-19 MED ORDER — PREDNISONE 5 MG PO TABS: ORAL_TABLET | ORAL | 0 refills | Status: DC

## 2020-08-19 NOTE — Telephone Encounter (Signed)
Spoke with Dr. Corliss Skains and she is ok with sending in prednisone 20 mg for 1 day, 15 mg for 1 day, 10 mg for 1 day, and 5 mg for 1 day.

## 2020-08-19 NOTE — Telephone Encounter (Signed)
Patient calling in reference to taking Ibuprofen. It is not helping at all. Patient wondering if she could do a short round of Prednisone over 4 days or so? Please call to advise.

## 2020-08-19 NOTE — Telephone Encounter (Signed)
Spoke with patient and advised her of prednisone taper, patient verbalized understanding and [facility] [place].

## 2020-08-20 ENCOUNTER — Ambulatory Visit: Payer: BC Managed Care – PPO | Admitting: Sports Medicine

## 2020-08-20 ENCOUNTER — Other Ambulatory Visit: Payer: Self-pay

## 2020-08-20 VITALS — BP 110/82 | Ht 63.0 in | Wt 148.0 lb

## 2020-08-20 DIAGNOSIS — M25561 Pain in right knee: Secondary | ICD-10-CM | POA: Diagnosis not present

## 2020-08-20 NOTE — Assessment & Plan Note (Signed)
Patient's exam and ultrasound consistent with mild degeneration of the medial meniscus and a lateral plica.  Overall, she has good stability of her knee with negative ligamentous testing.  Given that her pain has now improved, will go ahead and continue with some quad strengthening given her weakness, compression sleeve, and continue to modify activity for 3 to 4 weeks.  At that point, she has not having improvement, can return to care for possible corticosteroid injection or MRI.  She was put on prednisone with first dose today for her rheumatoid arthritis, therefore would not start any anti-inflammatory medications today.

## 2020-08-20 NOTE — Progress Notes (Signed)
Kathleen Alvarez is a 54 y.o. female who presents to [facility] today for the following:   Right Knee Pain Started 3 weeks ago, no clear injury Does crossfit and started to notice pain afterwards and when lunging and squatting Pain started as medial soreness and then moved into anterolateral portion of knee in last week or so No swelling Had pain in full flexion and worst and notes her knee felt "locked" Reports that her knee also felt like it would "give out" but she would never fall, just felt catching Now she reports constant clicking on lateral side of her knee Has been taking ibuprofen for a separate issue, 800mg  regularly In the last few days, pain has resolved She has continued to do crossfit but has been modifying for her pain Denies any history of injury to her right knee   PMH reviewed.  ROS as above. Medications reviewed.  Exam:  BP 110/82   Ht 5\' 3"  (1.6 m)   Wt 148 lb (67.1 kg)   BMI 26.22 kg/m  Gen: Well NAD MSK:  Right Knee: - Inspection: no gross deformity b/l. No swelling/effusion, erythema or bruising b/l. Skin intact b/l - Palpation: TTP right medial joint line.  Band of tissue laterally that pops while performing range of motion testing on right.  No crepitus palpated bilaterally. - ROM: full active ROM with flexion and extension in knee and hip bilaterally, pain with extreme of internal rotation of hip on right - Strength: 4/5 strength in flexion, extension on right, 5/5 strength hip abduction on right, 5/5 strength left knee and hip - Neuro/vasc: NV intact bilaterally - Special Tests: - LIGAMENTS: negative anterior and posterior drawer, negative Lachman's, no MCL or LCL laxity bilaterally -- MENISCUS: negative McMurray's bilaterally -- PF JOINT: nml patellar mobility bilaterally.  Positive patellar grind on right, negative patellar apprehension  Hips: normal ROM, positive FADIR on right, negative FABER bilaterally    Limited MSK ultrasound right knee: Images  were obtained both in the transverse and longitudinal plane. Patellar and quadriceps tendons were well visualized with no abnormalities. Small hypoechoic collection noted in suprapatellar pouch Medial and lateral menisci were well visualized with no abnormalities.  Medial joint line noted with some degeneration and hypoechoic collection surrounding medial meniscus. Lateral plica visualized and dynamic view obtained showing clicking over lateral femoral condyle Impression: Findings consistent with suprapatellar effusion, medial meniscus effusion with degeneration of medial joint line.  Lateral plica.  Assessment and Plan: 1) Acute pain of right knee Patient's exam and ultrasound consistent with mild degeneration of the medial meniscus and a lateral plica.  Overall, she has good stability of her knee with negative ligamentous testing.  Given that her pain has now improved, will go ahead and continue with some quad strengthening given her weakness, compression sleeve, and continue to modify activity for 3 to 4 weeks.  At that point, she has not having improvement, can return to care for possible corticosteroid injection or MRI.  She was put on prednisone with first dose today for her rheumatoid arthritis, therefore would not start any anti-inflammatory medications today.   , D.O.  PGY-3 Family Medicine  08/20/2020 5:02 PM   Patient seen and evaluated with the resident.  I agree with the above plan of care.  Patient definitely has a palpable plica along the lateral aspect of her knee.  I also think she is getting some discomfort from some degenerative changes in the medial compartment.  Treatment as above and follow-up in  3 to 4 weeks.  If symptoms persist, consider cortisone injection or possible MRI.

## 2020-08-20 NOTE — Patient Instructions (Signed)
You have some degenerative changes around your medial meniscus and some fluid in the area.  You also have a lateral plica which is causing the clicking sensation in your knee.   Use a compression sleeve when you work out and continue to modify your activity for 3-4 weeks.  Do the quad exercises that we showed you.  Come back afterwards if you aren't better.  At that point we could consider a steroid injection or an MRI if you don't have improvement.   Plica Syndrome  Plica syndrome is a painful knee condition. Plica syndrome happens when folds of tissue in the knee (plica) get swollen and rub against the kneecap or thigh bone. What are the causes? This condition may be caused by:  Bending or twisting the knee over and over again.  A hit to the knee. What increases the risk? The following factors may make you more likely to develop this condition:  Having hip or thigh muscles that are weak or tight.  Having hip or foot problems that change the normal position of the knee.  Having had a previous knee injury.  Playing contact sports.  Participating in activities that involve making the same knee movements over and over, such as running, cycling, or swimming. What are the signs or symptoms? The main symptom of this condition is a dull pain in the front or side of the knee. The pain comes and goes. It may get better with rest and worse with activities such as:  Standing.  Kneeling.  Walking.  Running.  Climbing stairs. Other symptoms of this condition include:  Swelling of the knee.  Pain when pressing on your knee.  A clicking or snapping feeling when you bend your knee.  A feeling that your knee is locking or catching.  A feeling like your knee is giving way (instability). How is this diagnosed? This condition is diagnosed based on:  Your medical history.  A physical exam. Your health care provider may move your knee in different directions and feel your knee to  check for pain and tenderness.  Imaging studies, such as: ? MRI. ? Ultrasound.  A procedure to look inside your knee joint (arthroscopy). How is this treated? This condition may be treated by:  Resting your knee until pain and swelling go down.  Avoiding activities that make pain worse.  Icing your knee.  Wearing a supportive sleeve around your knee.  Taking medicine to reduce pain and inflammation.  Getting injections in your knee.  Starting exercises to help you regain full movement (physical therapy) and strengthen your thigh muscles. If these treatments do not help after 6 months, you may need to have surgery to remove the swollen parts of your plica. Follow these instructions at home: If you have a sleeve:  Wear it as told by your health care provider. Remove it only as told by your health care provider.  Remove the sleeve if your toes tingle, become numb, or turn cold and blue.  Keep the sleeve clean.  If the sleeve is not waterproof: ? Do not let it get wet. ? Cover it with a waterproof covering when you take a bath or shower. Managing pain, stiffness, and swelling   If directed, put ice on your knee. ? Put ice in a plastic bag. ? Place a towel between your skin and the bag. ? Leave the ice on for 20 minutes, 2-3 times a day.  Raise (elevate) your knee above the level of your heart  while you are sitting or lying down. Driving  Ask your health care provider if the medicine prescribed to you requires you to avoid driving or using heavy machinery.  Ask your health care provider when it is safe to drive if you have a sleeve on your knee. Activity  Return to your normal activities as told by your health care provider. Ask your health care provider what activities are safe for you.  Do exercises as told by your health care provider. General instructions  Take over-the-counter and prescription medicines as told by your health care provider.  Do not use any  products that contain nicotine or tobacco, such as cigarettes, e-cigarettes, and chewing tobacco. If you need help quitting, ask your health care provider.  Keep all follow-up visits as told by your health care provider. This is important. How is this prevented?  Warm up and stretch before being active.  Cool down and stretch after being active.  Give your body time to rest between periods of activity.  Maintain physical fitness, including: ? Strength. ? Flexibility.  Be safe and responsible while being active. This will help you to avoid falls. Contact a health care provider if:  Your symptoms get worse.  Your symptoms have not improved after 6 months. Summary  Plica syndrome is a painful knee condition.  Plica syndrome happens when folds of tissue in the knee (plica) get swollen and rub against the kneecap or thigh bone.  This condition may be caused by repeatedly bending or twisting the knee or by a hit to the knee.  This condition is treated with rest, ice, medicines, a supportive sleeve, physical therapy, and surgery if needed. This information is not intended to replace advice given to you by your health care provider. Make sure you discuss any questions you have with your health care provider. Document Revised: 01/12/2019 Document Reviewed: 08/18/2018 Elsevier Patient Education  2020 [employer].

## 2020-09-10 ENCOUNTER — Other Ambulatory Visit: Payer: Self-pay | Admitting: Rheumatology

## 2020-09-10 DIAGNOSIS — M0579 Rheumatoid arthritis with rheumatoid factor of multiple sites without organ or systems involvement: Secondary | ICD-10-CM

## 2020-09-11 ENCOUNTER — Other Ambulatory Visit: Payer: Self-pay | Admitting: Neurology

## 2020-09-11 ENCOUNTER — Telehealth: Payer: Self-pay | Admitting: Neurology

## 2020-09-11 DIAGNOSIS — G47419 Narcolepsy without cataplexy: Secondary | ICD-10-CM

## 2020-09-11 MED ORDER — LISDEXAMFETAMINE DIMESYLATE 70 MG PO CAPS: 70.0000 mg | ORAL_CAPSULE | Freq: Every day | ORAL | 0 refills | Status: DC

## 2020-09-11 NOTE — Telephone Encounter (Signed)
Pt request refill lisdexamfetamine (VYVANSE) 70 MG capsule at [id number]  

## 2020-09-11 NOTE — Telephone Encounter (Signed)
I have routed this request to Dr Dohmeier for review. The pt is due for the medication and [place] registry was verified.  

## 2020-09-12 NOTE — Telephone Encounter (Signed)
Called the patient back. There was no answer. LVM advising that a 30 day supply was sent vs the 90 day supply because 10/03/2019 will mark it has been 1 yr since pt last visit. With this medication being a stimulant and monitored through [place] drug registry the patient must make sure to be seen yearly. The patient does have a upcoming apt on jan 5th, however because that puts her outside of the year mark, she wrote for a month supply to ensure patient keeps upcoming apt 10/10/19. After that we can go back to 3 mth supply for next refill.  LVM with above information and apologized for the inconvenience.

## 2020-09-12 NOTE — Telephone Encounter (Signed)
Pt called stating that her Vyvanse is supposed to be called in for a 90 day supply but yesterday it was called in for a 30 day supply and the pt would like the rest of it called in for her to pick it up. Please advise.

## 2020-09-26 ENCOUNTER — Other Ambulatory Visit: Payer: Self-pay | Admitting: Rheumatology

## 2020-09-26 DIAGNOSIS — M0579 Rheumatoid arthritis with rheumatoid factor of multiple sites without organ or systems involvement: Secondary | ICD-10-CM

## 2020-09-30 ENCOUNTER — Telehealth: Payer: Self-pay

## 2020-09-30 DIAGNOSIS — Z79899 Other long term (current) drug therapy: Secondary | ICD-10-CM

## 2020-09-30 DIAGNOSIS — Z111 Encounter for screening for respiratory tuberculosis: Secondary | ICD-10-CM

## 2020-09-30 NOTE — Telephone Encounter (Signed)
Patrice from [facility] left a voicemail requesting new prescription for patient's Humira Pen 40 mg.  If you have any questions, please call back at #[phone]

## 2020-10-01 NOTE — Telephone Encounter (Signed)
Last Visit: 05/21/2020 Next Visit: 10/22/2020 Labs: 03/15/2020 CBC and CMP WNL TB Gold:  10/19/2019 negative   Current Dose per office note on 05/21/2020: Humira 40 mg every 21 days.  [id number] arthritis involving multiple sites with positive rheumatoid factor   I attempted to contact patient and left message on machine to advise patient she is over due to update labs. Provided patient with lab hours. Orders are in place.

## 2020-10-03 ENCOUNTER — Other Ambulatory Visit: Payer: Self-pay | Admitting: *Deleted

## 2020-10-03 ENCOUNTER — Ambulatory Visit: Payer: BC Managed Care – PPO | Admitting: Neurology

## 2020-10-03 DIAGNOSIS — Z79899 Other long term (current) drug therapy: Secondary | ICD-10-CM

## 2020-10-03 DIAGNOSIS — M0579 Rheumatoid arthritis with rheumatoid factor of multiple sites without organ or systems involvement: Secondary | ICD-10-CM

## 2020-10-03 DIAGNOSIS — Z111 Encounter for screening for respiratory tuberculosis: Secondary | ICD-10-CM

## 2020-10-03 NOTE — Telephone Encounter (Signed)
Ok to provide 90-day supply of humira.

## 2020-10-03 NOTE — Telephone Encounter (Signed)
Patient was in office to update her lab work. Patient would like to have a 90 day refill on her Humira once labs result. Patient states she increased her dosing of the Humira from every 21 days to every 14 days last month due to a flare she was having. Please advise.

## 2020-10-05 LAB — QUANTIFERON-TB GOLD PLUS
Mitogen-NIL: 7.9 IU/mL
NIL: 0.08 IU/mL
QuantiFERON-TB Gold Plus: NEGATIVE
TB1-NIL: 0.01 IU/mL
TB2-NIL: 0.01 IU/mL

## 2020-10-05 LAB — COMPLETE METABOLIC PANEL WITH GFR
AG Ratio: 1.8 (calc) (ref 1.0–2.5)
ALT: 41 U/L — ABNORMAL HIGH (ref 6–29)
AST: 38 U/L — ABNORMAL HIGH (ref 10–35)
Albumin: 4.5 g/dL (ref 3.6–5.1)
Alkaline phosphatase (APISO): 77 U/L (ref 37–153)
BUN: 19 mg/dL (ref 7–25)
CO2: 29 mmol/L (ref 20–32)
Calcium: 10.2 mg/dL (ref 8.6–10.4)
Chloride: 102 mmol/L (ref 98–110)
Creat: 0.85 mg/dL (ref 0.50–1.05)
GFR, Est African American: 91 mL/min/{1.73_m2} (ref >=60)
GFR, Est Non African American: 79 mL/min/{1.73_m2} (ref >=60)
Globulin: 2.5 g/dL (calc) (ref 1.9–3.7)
Glucose, Bld: 94 mg/dL (ref 65–99)
Potassium: 4.9 mmol/L (ref 3.5–5.3)
Sodium: 140 mmol/L (ref 135–146)
Total Bilirubin: 0.4 mg/dL (ref 0.2–1.2)
Total Protein: 7 g/dL (ref 6.1–8.1)

## 2020-10-05 LAB — CBC WITH DIFFERENTIAL/PLATELET
Absolute Monocytes: 774 cells/uL (ref 200–950)
Basophils Absolute: 109 cells/uL (ref 0–200)
Basophils Relative: 1 %
Eosinophils Absolute: 153 cells/uL (ref 15–500)
Eosinophils Relative: 1.4 %
HCT: 44.7 % (ref 35.0–45.0)
Hemoglobin: 15.2 g/dL (ref 11.7–15.5)
Lymphs Abs: 2344 cells/uL (ref 850–3900)
MCH: 29.7 pg (ref 27.0–33.0)
MCHC: 34 g/dL (ref 32.0–36.0)
MCV: 87.3 fL (ref 80.0–100.0)
MPV: 11 fL (ref 7.5–12.5)
Monocytes Relative: 7.1 %
Neutro Abs: 7521 cells/uL (ref 1500–7800)
Neutrophils Relative %: 69 %
Platelets: 346 10*3/uL (ref 140–400)
RBC: 5.12 10*6/uL — ABNORMAL HIGH (ref 3.80–5.10)
RDW: 11.4 % (ref 11.0–15.0)
Total Lymphocyte: 21.5 %
WBC: 10.9 10*3/uL — ABNORMAL HIGH (ref 3.8–10.8)

## 2020-10-05 NOTE — Progress Notes (Signed)
CBC normal, AST and ALT elevated are elevated. TB gold is negative. Please advise patient to avoid all NSAIDs and alcohol use.

## 2020-10-07 MED ORDER — HUMIRA (2 PEN) 40 MG/0.4ML ~~LOC~~ AJKT: 40.0000 mg | AUTO-INJECTOR | SUBCUTANEOUS | 0 refills | Status: DC

## 2020-10-07 NOTE — Addendum Note (Signed)
Addended by: Henriette Combs on: 10/07/2020 10:07 AM   Modules accepted: Orders

## 2020-10-07 NOTE — Telephone Encounter (Signed)
Ok to refill humira 40 mg sq injections once every 14 days.

## 2020-10-08 NOTE — Progress Notes (Signed)
Office Visit Note  Patient: Kathleen Alvarez             Date of Birth: 11/19/1965           MRN: [id number]             PCP: [name], MD Referring: [name], MD Visit Date: 10/22/2020 Occupation: @GUAROCC @  Subjective:  Medication management   History of Present Illness: Kathleen Alvarez is a 55 y.o. female with rheumatoid arthritis.  She states she had a flare in November 2021.  She recently woke up 1 day with right hand swollen.  She tried ibuprofen without much help.  She called December 2021 eventually for prednisone taper and responded to the prednisone taper.  She has not had any gout flares since then.  She states she has been very active at work and has been exercising on a regular basis.  She denies any joint pain or joint swelling.  Activities of Daily Living:  Patient reports morning stiffness for 0 none.   Patient Denies nocturnal pain.  Difficulty dressing/grooming: Denies Difficulty climbing stairs: Denies Difficulty getting out of chair: Denies Difficulty using hands for taps, buttons, cutlery, and/or writing: Denies  Review of Systems  Constitutional: Negative for fatigue, night sweats, weight gain and weight loss.  HENT: Negative for mouth sores, trouble swallowing, trouble swallowing, mouth dryness and nose dryness.   Eyes: Negative for pain, redness, visual disturbance and dryness.  Respiratory: Negative for cough, shortness of breath and difficulty breathing.   Cardiovascular: Negative for chest pain, palpitations, hypertension, irregular heartbeat and swelling in legs/feet.  Gastrointestinal: Negative for blood in stool, constipation and diarrhea.  Endocrine: Positive for heat intolerance. Negative for increased urination.  Genitourinary: Negative for difficulty urinating and vaginal dryness.  Musculoskeletal: Negative for arthralgias, joint pain, joint swelling, myalgias, muscle weakness, morning stiffness, muscle tenderness and myalgias.  Skin: Negative for  color change, rash, hair loss, skin tightness, ulcers and sensitivity to sunlight.  Allergic/Immunologic: Negative for susceptible to infections.  Neurological: Negative for dizziness, numbness, memory loss, night sweats and weakness.  Hematological: Negative for bruising/bleeding tendency and swollen glands.  Psychiatric/Behavioral: Positive for sleep disturbance. Negative for depressed mood. The patient is not nervous/anxious.     PMFS History:  Patient Active Problem List   Diagnosis Date Noted  . Acute pain of right knee 08/20/2020  . High risk medication use 12/19/2019  . Strain of calf muscle 03/17/2018  . Rheumatoid arthritis involving multiple sites with positive rheumatoid factor (HCC) 05/03/2017  . Narcolepsy 09/15/2016  . 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 Anxiety   . 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 RA (rheumatoid arthritis) (HCC)     Family History  Problem Relation Age of Onset  . Colon cancer Father        deceased  . Liver cancer Father 25       deceased   . Gout Brother    Past Surgical History:  Procedure Laterality Date  . CESAREAN SECTION     x2  . COLONOSCOPY  2009   family history of colon ca  . DILITATION & CURRETTAGE/HYSTROSCOPY WITH NOVASURE ABLATION  08/26/2012   Procedure: DILATATION & CURETTAGE/HYSTEROSCOPY WITH NOVASURE ABLATION;  Surgeon: 08/28/2012, MD;  Location: [facility];  Service: Gynecology;  Laterality: N/A;  . lipoma     . WISDOM TOOTH EXTRACTION  Social History   Social History Narrative   Patient is employed.    Patient married with 2 children.      Immunization History  Administered Date(s) Administered  . Influenza, Seasonal, Injecte, Preservative Fre 08/31/2014  . Moderna Sars-Covid-2 Vaccination 12/14/2019, 01/16/2020, 09/23/2020      Objective: Vital Signs: BP 107/72 (BP Location: Left Arm, Patient Position: Sitting, Cuff Size: Normal)   Pulse 94   Resp 14   Ht 5\' 3"  (1.6 m)   Wt 150 lb (68 kg)   BMI 26.57 kg/m    Physical Exam Vitals and nursing note reviewed.  Constitutional:      Appearance: She is well-developed and well-nourished.  HENT:     Head: Normocephalic and atraumatic.  Eyes:     Extraocular Movements: EOM normal.     Conjunctiva/sclera: Conjunctivae normal.  Cardiovascular:     Rate and Rhythm: Normal rate and regular rhythm.     Pulses: Intact distal pulses.     Heart sounds: Normal heart sounds.  Pulmonary:     Effort: Pulmonary effort is normal.     Breath sounds: Normal breath sounds.  Abdominal:     General: Bowel sounds are normal.     Palpations: Abdomen is soft.  Musculoskeletal:     Cervical back: Normal range of motion.  Lymphadenopathy:     Cervical: No cervical adenopathy.  Skin:    General: Skin is warm and dry.     Capillary Refill: Capillary refill takes less than 2 seconds.  Neurological:     Mental Status: She is alert and oriented to person, place, and time.  Psychiatric:        Mood and Affect: Mood and affect normal.        Behavior: Behavior normal.      Musculoskeletal Exam: C-spine thoracic and lumbar spine with good range of motion.  Shoulder joints, elbow joints, wrist joints, MCPs PIPs and DIPs with good range of motion with no synovitis.  Hip joints, knee joints, ankles, MTPs and PIPs with good range of motion with no synovitis.  There was no tenderness on the examination.  CDAI Exam: CDAI Score: 0  Patient Global: 0 mm; Provider Global: 0 mm Swollen: 0 ; Tender: 0  Joint Exam 10/22/2020   No joint exam has been documented for this visit   There is currently no information documented on the homunculus. Go to the Rheumatology activity and complete the homunculus joint exam.  Investigation: No additional findings.  Imaging: No results  found.  Recent Labs: Lab Results  Component Value Date   WBC 10.9 (H) 10/03/2020   HGB 15.2 10/03/2020   PLT 346 10/03/2020   NA 140 10/03/2020   K 4.9 10/03/2020   CL 102 10/03/2020   CO2 29 10/03/2020   GLUCOSE 94 10/03/2020   BUN 19 10/03/2020   CREATININE 0.85 10/03/2020   BILITOT 0.4 10/03/2020   ALKPHOS 83 10/19/2019   AST 38 (H) 10/03/2020   ALT 41 (H) 10/03/2020   PROT 7.0 10/03/2020   ALBUMIN 4.5 10/19/2019   CALCIUM 10.2 10/03/2020   GFRAA 91 10/03/2020   QFTBGOLDPLUS NEGATIVE 10/03/2020    Speciality Comments: No specialty comments available.  Procedures:  No procedures performed Allergies: Peg [polyethylene glycol]   Assessment / Plan:     Visit Diagnoses: Rheumatoid arthritis involving multiple sites with positive rheumatoid factor (HCC) - +RF, +CCP.  Patient had no synovitis on my examination.  She had a flare in November 2021.  The symptoms resolved after prednisone taper.  She had no flares since that episode.  She has been active at work and doing exercises on a regular basis.  High risk medication use - Humira 40 mg every 14 days.  Her labs on October 03, 2000 showed elevated WBC count due to prednisone use and also elevated LFTs due to ibuprofen use.  She has discontinued ibuprofen now.  She is fully vaccinated against COVID-19 and also received a booster.  Use of mask, social distancing and hand hygiene was discussed.  She had several questions regarding the immunization.  We reviewed immunization status.  She will need a tetanus vaccine, Shingrix vaccine and most likely hepatitis B vaccine.  Have advised her to discuss it further with her PCP.  Other fatigue-related to insomnia.  Other insomnia-she has been followed by Dr. Brett Fairy.  Narcolepsy due to underlying condition without cataplexy-she had recent visit with Dr. Brett Fairy.  Attention deficit hyperactivity disorder (ADHD), predominantly inattentive type  Postmenopausal - her bone density done in  August 2019 was within normal limits.  Orders: No orders of the defined types were placed in this encounter.  No orders of the defined types were placed in this encounter.   .  Follow-Up Instructions: Return in about 5 months (around 03/22/2021) for Rheumatoid arthritis.   Bo Merino, MD  Note - This record has been created using Editor, commissioning.  Chart creation errors have been sought, but may not always  have been located. Such creation errors do not reflect on  the standard of medical care.

## 2020-10-09 ENCOUNTER — Encounter: Payer: Self-pay | Admitting: Neurology

## 2020-10-09 ENCOUNTER — Ambulatory Visit (INDEPENDENT_AMBULATORY_CARE_PROVIDER_SITE_OTHER): Payer: BC Managed Care – PPO | Admitting: Neurology

## 2020-10-09 DIAGNOSIS — G47419 Narcolepsy without cataplexy: Secondary | ICD-10-CM

## 2020-10-09 MED ORDER — LISDEXAMFETAMINE DIMESYLATE 70 MG PO CAPS
70.0000 mg | ORAL_CAPSULE | Freq: Every day | ORAL | 0 refills | Status: DC
Start: 2020-10-09 — End: 2020-11-11

## 2020-10-09 MED ORDER — LISDEXAMFETAMINE DIMESYLATE 70 MG PO CAPS: 70.0000 mg | ORAL_CAPSULE | Freq: Every day | ORAL | 0 refills | Status: DC

## 2020-10-09 NOTE — Addendum Note (Signed)
Addended by: Melvyn Novas on: 10/09/2020 11:14 AM   Modules accepted: Orders

## 2020-10-09 NOTE — Progress Notes (Signed)
PATIENT: Kathleen Alvarez DOB: [birth year]  REASON FOR VISIT: follow up HISTORY FROM: patient  HISTORY OF PRESENT ILLNESS:  10/09/20:Interval history for this long -time established 55 year-old patient of mine with hypersomnia,seen here as a revisit for medication refill in the treatment of narcolepsy. She attributes weight gain to menopause.  She is reporting a lot of fatigue. Her job has changed a lot with the pandemic. 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  She reports ongoing effect from Vyvanse, keeping her alert and safe and productive. She is now in perimenopause.  She is going through insulin resistance - her rheumatologist follows her for rheumatoid arthritis.  She has developed abdominal fat, and is frustrated about hat. She began craving carbs.  We are dicussing sleep aids at [facility]. Trazodone stands out for a patient with dry eyes and dry mouth, as it doesn't have these side effect.  During the pandemic her teaching has been on hold.    [name] is a 55 year old female with a history of narcolepsy.  She returns today for follow-up.  She is currently on Vyvanse.  She reports that she tolerates this well.  This continues to work well for her.  She denies falling asleep at work or while driving.  Denies any heart palpitations or depression.  She states that she does have an Adderall prescription but she only uses this if she has an episode where she cannot stay awake.  Reports that she only uses this approximately 5 times a year.  She denies any cataplectic events.  She returns today for evaluation.   HISTORY Kathleen Alvarez is a 55 y.o. female and  seen here as a revisit for medication refill in the treatment of narcolepsy.    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  She had this condition since college years and was first seen in this practice in 2007. For the treatment of for hypersomnia she was started on Provigil but within 6 months she had noted some irritability and anger on that medication, this in addition was more complicated as the patient became pregnant.  Provigil was therefore discontinued and she was started on Adderall , Tolerated much better for about a year she was eventually changed to Vyvanse in 2008 . In April 2016 she was diagnosed with rheumatoid arthritis, takes MTX. this condition deepens her fatigue further.  She continues to do well on Vyvanse after over 7 years. She still sleeps 10-12 hours over a 24 hour period. She is not snoring. No apnea.   Interval history from 09/15/2016. I have the pleasure of seeing Dr. [name], PhD, for routine revisit. The patient introduced to tolerate Vyvanse which she will have taken for 10 years next year. Refills today. No evidence of hypertension, hypersomnia, hyperglycemia.  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  Her degree as an exercise physiology. She endorsed the Epworth sleepiness scale today still at 17 points without medicine and under the influence of Vyvanse around 11 points. We  continue Vyvanse.  She has been on the medication now for 10 years. She has been sleepy all her adult life, since college, perhaps late years of high school.   REVIEW OF SYSTEMS: Out of a complete 14 system review of symptoms, the patient complains only of the following symptoms, and all other reviewed systems are negative. How likely are you to doze in the following situations: 0 = not likely, 1 = slight chance, 2 =  moderate chance, 3 = high chance  Sitting and Reading?3 Watching Television? 2 Sitting inactive in a public place (theater or meeting)? 2 Lying down in the afternoon when circumstances permit?33 Sitting and talking to someone? 1 Sitting quietly after lunch without alcohol?2 In a car, while stopped for a few minutes in traffic?2 As a passenger in a car for an hour without a break?3  Total = 12/ 24 (!)  FSS at 40/63  Insomnia with hot flushes.     ALLERGIES: Allergies  Allergen Reactions  . Peg [Polyethylene Glycol] Itching and Nausea And Vomiting    Used when had colonoscopy prep    HOME MEDICATIONS: Outpatient Medications Prior to Visit  Medication Sig Dispense Refill  . Adalimumab (HUMIRA PEN) 40 MG/0.4ML PNKT Inject 40 mg into the skin every 14 (fourteen) days. 6 each 0  . albuterol (PROVENTIL HFA;VENTOLIN HFA) 108 (90 BASE) MCG/ACT inhaler Inhale 2 puffs into the lungs every 6 (six) hours as needed. For exercise-induced bronchospasm    . amphetamine-dextroamphetamine (ADDERALL) 10 MG tablet Take 10 mg by mouth as needed.    . cholecalciferol (VITAMIN D) 1000 UNITS tablet Take 5,000 Units by mouth. Monday-Friday    . ibuprofen (ADVIL) 800 MG tablet Take 1 tablet (800 mg total) by mouth every 8 (eight) hours as needed. 12 tablet 0  . lisdexamfetamine (VYVANSE) 70 MG capsule Take 1 capsule (70 mg total) by mouth daily. 30 capsule 0  . Multiple Vitamin (MULTIVITAMIN WITH MINERALS) TABS Take 1 tablet by mouth daily.    . Omega-3 Fatty Acids (FISH OIL PO) Take by mouth daily.    Marland Kitchen venlafaxine (EFFEXOR) 37.5 MG tablet Take 37.5 mg by mouth daily.    . predniSONE (DELTASONE) 5 MG tablet Take 4 tablets by mouth daily x1 day, 3  tabs po qd x 1 day, 2 tabs po qd x 1 day, 1  tab po qd x 1 day 10 tablet 0  . traZODone (DESYREL) 50 MG tablet Take 1 tablet (50 mg total) by mouth at bedtime as needed for sleep. 90 tablet 1   No facility-administered medications prior to visit.    PAST  MEDICAL HISTORY: Past Medical History:  Diagnosis Date  . Anxiety   . 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 RA (rheumatoid arthritis) ([facility])     PAST SURGICAL HISTORY: Past Surgical History:  Procedure Laterality Date  . CESAREAN SECTION     x2  . COLONOSCOPY  2009   family history of colon ca  . DILITATION & CURRETTAGE/HYSTROSCOPY WITH NOVASURE ABLATION  08/26/2012   Procedure: DILATATION & CURETTAGE/HYSTEROSCOPY WITH NOVASURE ABLATION;  Surgeon: Lenoard Aden, MD;  Location: [facility];  Service: Gynecology;  Laterality: N/A;  . lipoma     . WISDOM TOOTH EXTRACTION      FAMILY HISTORY: Family History  Problem Relation Age of Onset  . Colon cancer Father        deceased  . Liver cancer Father 58       deceased   . Gout Brother  SOCIAL HISTORY: Social History   Socioeconomic History  . Marital status: Married    Spouse name: [name]   . Number of children: 2  . Years of education: 12+  . Highest education level: Not on file  Occupational History  . Occupation: Photographer: [employer]  Tobacco Use  . Smoking status: Never Smoker  . Smokeless tobacco: Never Used  Vaping Use  . Vaping Use: Never used  Substance and Sexual Activity  . Alcohol use: No  . Drug use: No  . Sexual activity: Not on file  Other Topics Concern  . Not on file  Social History Narrative   Patient is employed.    Patient married with 2 children.      Social Determinants of Health   Financial Resource Strain: Not on file  Food Insecurity: Not on file  Transportation Needs: Not on file  Physical Activity: Not on file  Stress: Not on file  Social Connections: Not on file  Intimate Partner Violence: Not on file      PHYSICAL EXAM  Vitals:   10/09/20 1043  BP: 122/77  Pulse: 82  Weight: 152 lb (68.9 kg)  Height: 5\' 3"  (1.6 m)   Body mass index is 26.93 kg/m.  Generalized: Well developed, in no acute distress   Skin-  Dry and warm. No edema,  no rash.  Posture- erect. BMI 27. Regular heart rate, no murmur, LCTA.   Neurological examination  Mentation:  No loss of taste or smell- Alert oriented to time, place, history taking.  Follows all commands her speech is clear and fluent Cranial nerves :  Extraocular movements were full, visual field were full on confrontational test. Facial sensation and strength were normal. Head turning and shoulder shrug were normal and symmetric. Motor:  full strength of all 4 extremities. No cog-wheeling, no spatsicity-   Good grip strength,  Hands have been stiff through rheumatoid arthritis.  Good symmetric motor tone is noted throughout.  Sensory:  intact to soft touch  and vibration,on all 4 extremities. No evidence of extinction is noted.  Coordination: finger to nose intact. No changes in penmanship   Gait and station:  Normal. She demonstrated good balance, turns with 3 steps, no limp. Straight posture.    DIAGNOSTIC DATA (LABS, IMAGING, TESTING) - I reviewed patient records, labs, notes, testing and imaging myself where available.  Vaccine documentation - Covid 19:  full vaccinated with 2 doses with Moderna , now boostered.   Lab Results  Component Value Date   WBC 10.9 (H) 10/03/2020   HGB 15.2 10/03/2020   HCT 44.7 10/03/2020   MCV 87.3 10/03/2020   PLT 346 10/03/2020      Component Value Date/Time   NA 140 10/03/2020 0000   NA 139 10/19/2019 1002   K 4.9 10/03/2020 0000   CL 102 10/03/2020 0000   CO2 29 10/03/2020 0000   GLUCOSE 94 10/03/2020 0000   BUN 19 10/03/2020 0000   BUN 13 10/19/2019 1002   CREATININE 0.85 10/03/2020 0000   CALCIUM 10.2 10/03/2020 0000   PROT 7.0 10/03/2020 0000   PROT 6.5 10/19/2019 1002   ALBUMIN 4.5 10/19/2019 1002   AST 38 (H) 10/03/2020 0000   ALT 41 (H) 10/03/2020 0000   ALKPHOS 83 10/19/2019 1002   BILITOT 0.4 10/03/2020 0000   BILITOT 0.6 10/19/2019 1002   GFRNONAA 79 10/03/2020 0000   GFRAA 91 10/03/2020 0000     ASSESSMENT  AND PLAN 55 y.o. year old female  has a past medical history of Anxiety, 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.00) (09/19/2014), and RA (rheumatoid arthritis) ([place]). here with:  1. Narcolepsy without cataplexy , for many years treated on Vyvanse.  2. Hormonal changes, perimenopausal insulin resistance and sleep disturbance- will try trazodone.  She is interested in  HRT , will discuss with Dr. Ronita Hipps.  The patient will continue on Vyvanse and Adderall.  The patient is concerned that her authorization for Vyvanse will expire and she will run out of medication before they will refill it.   I advised that we will call the pharmacy and try to do a preauthorization.    She voiced understanding. She will follow-up in  12 month- or sooner if needed.  I spent 21 minutes with the patient. 50% of this time was spent discussing her medication.  Larey Seat, MD   10/09/2020, 10:47 AM [facility] [address] [place], [place] [address] [phone]

## 2020-10-09 NOTE — Patient Instructions (Signed)
Lisdexamfetamine Oral Capsule What is this medicine? LISDEXAMFETAMINE (lis DEX am fet a meen) is used to treat attention-deficit hyperactivity disorder (ADHD) in adults and children. It is also used to treat binge-eating disorder in adults. Federal law prohibits giving this medicine to any person other than the person for whom it was prescribed. Do not share this medicine with anyone else. This medicine may be used for other purposes; ask your health care provider or pharmacist if you have questions. COMMON BRAND NAME(S): Vyvanse What should I tell my health care provider before I take this medicine? They need to know if you have any of these conditions:  anxiety or panic attacks  circulation problems in fingers and toes  glaucoma  hardening or blockages of the arteries or heart blood vessels  heart disease or a heart defect  high blood pressure  history of a drug or alcohol abuse problem  history of stroke  kidney disease  liver disease  mental illness  seizures  suicidal thoughts, plans, or attempt; a previous suicide attempt by you or a family member  thyroid disease  Tourette's syndrome  an unusual or allergic reaction to lisdexamfetamine, other medicines, foods, dyes, or preservatives  pregnant or trying to get pregnant  breast-feeding How should I use this medicine? Take this medicine by mouth. Follow the directions on the prescription label. Swallow the capsules with a drink of water. You may open capsule and add to a glass of water, then drink right away. Take your doses at regular intervals. Do not take your medicine more often than directed. Do not suddenly stop your medicine. You must gradually reduce the dose or you may feel withdrawal effects. Ask your doctor or health care professional for advice. A special MedGuide will be given to you by the pharmacist with each prescription and refill. Be sure to read this information carefully each time. Talk to your  pediatrician regarding the use of this medicine in children. While this drug may be prescribed for children as young as 6 years of age for selected conditions, precautions do apply. Overdosage: If you think you have taken too much of this medicine contact a poison control center or emergency room at once. NOTE: This medicine is only for you. Do not share this medicine with others. What if I miss a dose? If you miss a dose, take it as soon as you can. If it is almost time for your next dose, take only that dose. Do not take double or extra doses. What may interact with this medicine? Do not take this medicine with any of the following medications:  MAOIs like Carbex, Eldepryl, Marplan, Nardil, and Parnate  other stimulant medicines for attention disorders, weight loss, or to stay awake This medicine may also interact with the following medications:  acetazolamide  ammonium chloride  antacids  ascorbic acid  atomoxetine  caffeine  certain medicines for blood pressure  certain medicines for depression, anxiety, or psychotic disturbances  certain medicines for seizures like carbamazepine, phenobarbital, phenytoin  certain medicines for stomach problems like cimetidine, famotidine, omeprazole, lansoprazole  cold or allergy medicines  green tea  levodopa  linezolid  medicines for sleep during surgery  methenamine  norepinephrine  phenothiazines like chlorpromazine, mesoridazine, prochlorperazine, thioridazine  propoxyphene  sodium acid phosphate  sodium bicarbonate This list may not describe all possible interactions. Give your health care provider a list of all the medicines, herbs, non-prescription drugs, or dietary supplements you use. Also tell them if you smoke, drink alcohol,   or use illegal drugs. Some items may interact with your medicine. What should I watch for while using this medicine? Visit your doctor for regular check ups. This prescription requires  that you follow special procedures with your doctor and pharmacy. You will need to have a new written prescription from your doctor every time you need a refill. This medicine may affect your concentration, or hide signs of tiredness. Until you know how this medicine affects you, do not drive, ride a bicycle, use machinery, or do anything that needs mental alertness. Tell your doctor or health care professional if this medicine loses its effects, or if you feel you need to take more than the prescribed amount. Do not change your dose without talking to your doctor or health care professional. Decreased appetite is a common side effect when starting this medicine. Eating small, frequent meals or snacks can help. Talk to your doctor if you continue to have poor eating habits. Height and weight growth of a child taking this medicine will be monitored closely. Do not take this medicine close to bedtime. It may prevent you from sleeping. If you are going to need surgery, a MRI, CT scan, or other procedure, tell your doctor that you are taking this medicine. You may need to stop taking this medicine before the procedure. Tell your doctor or healthcare professional right away if you notice unexplained wounds on your fingers and toes while taking this medicine. You should also tell your healthcare provider if you experience numbness or pain, changes in the skin color, or sensitivity to temperature in your fingers or toes. What side effects may I notice from receiving this medicine? Side effects that you should report to your doctor or health care professional as soon as possible:  allergic reactions like skin rash, itching or hives, swelling of the face, lips, or tongue  changes in vision  chest pain or chest tightness  confusion, trouble speaking or understanding  fast, irregular heartbeat  fingers or toes feel numb, cool, painful  hallucination, loss of contact with reality  high blood  pressure  males: prolonged or painful erection  seizures  severe headaches  shortness of breath  suicidal thoughts or other mood changes  trouble walking, dizziness, loss of balance or coordination  uncontrollable head, mouth, neck, arm, or leg movements Side effects that usually do not require medical attention (report to your doctor or health care professional if they continue or are bothersome):  anxious  headache  loss of appetite  nausea, vomiting  trouble sleeping  weight loss This list may not describe all possible side effects. Call your doctor for medical advice about side effects. You may report side effects to FDA at 1-800-FDA-1088. Where should I keep my medicine? Keep out of the reach of children. This medicine can be abused. Keep your medicine in a safe place to protect it from theft. Do not share this medicine with anyone. Selling or giving away this medicine is dangerous and against the law. Store at room temperature between 15 and 30 degrees C (59 and 86 degrees F). Protect from light. Keep container tightly closed. Throw away any unused medicine after the expiration date. NOTE: This sheet is a summary. It may not cover all possible information. If you have questions about this medicine, talk to your doctor, pharmacist, or health care provider.  2020 Elsevier/Gold Standard (2014-07-25 19:20:14)  

## 2020-10-22 ENCOUNTER — Encounter: Payer: Self-pay | Admitting: Rheumatology

## 2020-10-22 ENCOUNTER — Other Ambulatory Visit: Payer: Self-pay

## 2020-10-22 ENCOUNTER — Ambulatory Visit: Payer: Self-pay | Admitting: Rheumatology

## 2020-10-22 VITALS — BP 107/72 | HR 94 | Resp 14 | Ht 63.0 in | Wt 150.0 lb

## 2020-10-22 DIAGNOSIS — Z79899 Other long term (current) drug therapy: Secondary | ICD-10-CM

## 2020-10-22 DIAGNOSIS — Z780 Asymptomatic menopausal state: Secondary | ICD-10-CM

## 2020-10-22 DIAGNOSIS — M0579 Rheumatoid arthritis with rheumatoid factor of multiple sites without organ or systems involvement: Secondary | ICD-10-CM

## 2020-10-22 DIAGNOSIS — F900 Attention-deficit hyperactivity disorder, predominantly inattentive type: Secondary | ICD-10-CM

## 2020-10-22 DIAGNOSIS — G4709 Other insomnia: Secondary | ICD-10-CM

## 2020-10-22 DIAGNOSIS — G47429 Narcolepsy in conditions classified elsewhere without cataplexy: Secondary | ICD-10-CM

## 2020-10-22 DIAGNOSIS — R5383 Other fatigue: Secondary | ICD-10-CM

## 2020-10-22 NOTE — Patient Instructions (Addendum)
Standing Labs We placed an order today for your standing lab work.   Please have your standing labs drawn in March and every 3 months  If possible, please have your labs drawn 2 weeks prior to your appointment so that the provider can discuss your results at your appointment.  We have open lab daily Monday through Thursday from 8:30-12:30 PM and 1:30-4:30 PM and Friday from 8:30-12:30 PM and 1:30-4:00 PM at the office of Dr. Pollyann Savoy, [facility].   Please be advised, patients with office appointments requiring lab work will take precedents over walk-in lab work.  If possible, please come for your lab work on Monday and Friday afternoons, as you may experience shorter wait times. The office is located at [address], [address], [place], [place] [address] No appointment is necessary.   Labs are drawn by [facility]. Please bring your co-pay at the time of your lab draw.  You may receive a bill from [facility] for your lab work.  If you wish to have your labs drawn at another location, please call the office 24 hours in advance to send orders.  If you have any questions regarding directions or hours of operation,  please call [phone].   As a reminder, please drink plenty of water prior to coming for your lab work. Thanks!   Vaccines You are taking a medication(s) that can suppress your immune system.  The following immunizations are recommended: . Flu annually . Covid-19  . Pneumonia (Pneumovax 23 and Prevnar 13 spaced at least 1 year apart) . Shingrix (after age 41)  Please check with your PCP to make sure you are up to date.   Heart Disease Prevention   Your inflammatory disease increases your risk of heart disease which includes heart attack, stroke, atrial fibrillation (irregular heartbeats), high blood pressure, heart failure and atherosclerosis (plaque in the arteries).  It is important to reduce your risk by:   . Keep blood pressure, cholesterol, and blood  sugar at healthy levels   . Smoking Cessation   . Maintain a healthy weight  o BMI 20-25   . Eat a healthy diet  o Plenty of fresh fruit, vegetables, and whole grains  o Limit saturated fats, foods high in sodium, and added sugars  o DASH and Mediterranean diet   . Increase physical activity  o Recommend moderate physically activity for 150 minutes per week/ 30 minutes a day for five days a week These can be broken up into three separate ten-minute sessions during the day.   . Reduce Stress  . Meditation, slow breathing exercises, yoga, coloring books  . Dental visits twice a year

## 2020-11-11 ENCOUNTER — Telehealth: Payer: Self-pay | Admitting: Neurology

## 2020-11-11 ENCOUNTER — Other Ambulatory Visit: Payer: Self-pay | Admitting: Neurology

## 2020-11-11 DIAGNOSIS — G47419 Narcolepsy without cataplexy: Secondary | ICD-10-CM

## 2020-11-11 MED ORDER — LISDEXAMFETAMINE DIMESYLATE 70 MG PO CAPS: 70.0000 mg | ORAL_CAPSULE | Freq: Every day | ORAL | 0 refills | Status: DC

## 2020-11-11 NOTE — Telephone Encounter (Signed)
Pt request refill lisdexamfetamine (VYVANSE) 70 MG capsule at [id number]. Would like a call from the nurse to confirm will be a 90-day supply.

## 2020-11-11 NOTE — Telephone Encounter (Signed)
I have routed this request to Dr Vickey Huger for review. The pt is due for the medication and [place] registry was verified. Script was sent for a 90 day supply. only reason it wasn't before was because patient had to keep her yearly apt.

## 2020-11-21 ENCOUNTER — Emergency Department (HOSPITAL_COMMUNITY): Payer: BC Managed Care – PPO

## 2020-11-21 ENCOUNTER — Other Ambulatory Visit: Payer: Self-pay

## 2020-11-21 ENCOUNTER — Emergency Department (HOSPITAL_COMMUNITY)
Admission: EM | Admit: 2020-11-21 | Discharge: 2020-11-22 | Disposition: A | Discharge status: Home / Self Care | Payer: BC Managed Care – PPO | Attending: Emergency Medicine | Admitting: Emergency Medicine

## 2020-11-21 DIAGNOSIS — R531 Weakness: Secondary | ICD-10-CM | POA: Diagnosis not present

## 2020-11-21 DIAGNOSIS — R4701 Aphasia: Secondary | ICD-10-CM | POA: Diagnosis not present

## 2020-11-21 LAB — DIFFERENTIAL
Abs Immature Granulocytes: 0.02 10*3/uL (ref 0.00–0.07)
Basophils Absolute: 0.1 10*3/uL (ref 0.0–0.1)
Basophils Relative: 1 %
Eosinophils Absolute: 0.4 10*3/uL (ref 0.0–0.5)
Eosinophils Relative: 5 %
Immature Granulocytes: 0 %
Lymphocytes Relative: 37 %
Lymphs Abs: 3.1 10*3/uL (ref 0.7–4.0)
Monocytes Absolute: 0.6 10*3/uL (ref 0.1–1.0)
Monocytes Relative: 8 %
Neutro Abs: 4.1 10*3/uL (ref 1.7–7.7)
Neutrophils Relative %: 49 %

## 2020-11-21 LAB — I-STAT CHEM 8, ED
BUN: 16 mg/dL (ref 6–20)
Calcium, Ion: 1.23 mmol/L (ref 1.15–1.40)
Chloride: 103 mmol/L (ref 98–111)
Creatinine, Ser: 0.7 mg/dL (ref 0.44–1.00)
Glucose, Bld: 84 mg/dL (ref 70–99)
HCT: 43 % (ref 36.0–46.0)
Hemoglobin: 14.6 g/dL (ref 12.0–15.0)
Potassium: 4 mmol/L (ref 3.5–5.1)
Sodium: 140 mmol/L (ref 135–145)
TCO2: 27 mmol/L (ref 22–32)

## 2020-11-21 LAB — COMPREHENSIVE METABOLIC PANEL
ALT: 35 U/L (ref 0–44)
AST: 36 U/L (ref 15–41)
Albumin: 3.7 g/dL (ref 3.5–5.0)
Alkaline Phosphatase: 73 U/L (ref 38–126)
Anion gap: 11 (ref 5–15)
BUN: 14 mg/dL (ref 6–20)
CO2: 27 mmol/L (ref 22–32)
Calcium: 9.5 mg/dL (ref 8.9–10.3)
Chloride: 102 mmol/L (ref 98–111)
Creatinine, Ser: 0.79 mg/dL (ref 0.44–1.00)
GFR, Estimated: >60 mL/min (ref >60)
Glucose, Bld: 84 mg/dL (ref 70–99)
Potassium: 4 mmol/L (ref 3.5–5.1)
Sodium: 140 mmol/L (ref 135–145)
Total Bilirubin: 0.9 mg/dL (ref 0.3–1.2)
Total Protein: 6.4 g/dL — ABNORMAL LOW (ref 6.5–8.1)

## 2020-11-21 LAB — CBC
HCT: 44.9 % (ref 36.0–46.0)
Hemoglobin: 14.7 g/dL (ref 12.0–15.0)
MCH: 28.7 pg (ref 26.0–34.0)
MCHC: 32.7 g/dL (ref 30.0–36.0)
MCV: 87.5 fL (ref 80.0–100.0)
Platelets: 378 10*3/uL (ref 150–400)
RBC: 5.13 MIL/uL — ABNORMAL HIGH (ref 3.87–5.11)
RDW: 12.2 % (ref 11.5–15.5)
WBC: 8.3 10*3/uL (ref 4.0–10.5)
nRBC: 0 % (ref 0.0–0.2)

## 2020-11-21 LAB — PROTIME-INR
INR: 1 (ref 0.8–1.2)
Prothrombin Time: 12.7 seconds (ref 11.4–15.2)

## 2020-11-21 LAB — ETHANOL: Alcohol, Ethyl (B): <10 mg/dL (ref <10)

## 2020-11-21 LAB — URINALYSIS, ROUTINE W REFLEX MICROSCOPIC
Bacteria, UA: NONE SEEN
Bilirubin Urine: NEGATIVE
Glucose, UA: NEGATIVE mg/dL
Ketones, ur: NEGATIVE mg/dL
Leukocytes,Ua: NEGATIVE
Nitrite: NEGATIVE
Protein, ur: NEGATIVE mg/dL
Specific Gravity, Urine: 1.026 (ref 1.005–1.030)
pH: 5 (ref 5.0–8.0)

## 2020-11-21 LAB — RAPID URINE DRUG SCREEN, HOSP PERFORMED
Amphetamines: POSITIVE — AB
Barbiturates: NOT DETECTED
Benzodiazepines: NOT DETECTED
Cocaine: NOT DETECTED
Opiates: NOT DETECTED
Tetrahydrocannabinol: NOT DETECTED

## 2020-11-21 LAB — APTT: aPTT: 24 seconds (ref 24–36)

## 2020-11-21 MED ORDER — IOHEXOL 350 MG/ML SOLN
80.0000 mL | Freq: Once | INTRAVENOUS | Status: AC | PRN
  Administered 2020-11-21: 80 mL via INTRAVENOUS

## 2020-11-21 NOTE — ED Triage Notes (Signed)
Pt. Stated, I had 3 episodes while I was working out. 1st time, I went to pick up rope and couldn't grasp unable to fill, lasted about 20 seconds. 2nd episode I was counting to 10 and got to 9 and couldn't remember after 9 , then it came back.  The 3rd episode I was telling my coach and it was grabbled.  Pt had = grips, no arm grip, symmetrical smile. Speech not slurred. Pt. Alert and oriented. X 4.

## 2020-11-21 NOTE — ED Provider Notes (Signed)
[facility] EMERGENCY DEPARTMENT Provider Note   CSN: [id number] Arrival date & time: 11/21/20  1823     History Chief Complaint  Patient presents with  . Numbness  . Aphasia    Kathleen Alvarez is a 55 y.o. female w/ h/o RA and anxiety who presents to the ED for weakness and aphasia. Patient at gym earlier this evening at approximately 1715 when she experienced 20-second episode of R hand weakness and inability to grab jump rope. Symptoms resolved spontaneously. Several minutes later, patient developed slurred speech and R facial droop that lasted less than 1 minute and then resolved. Asymptomatic since resolution of symptoms. No alleviating or aggravating factors. No previous similar episodes. No recent head injury or illness. Denies headache, vision changes, dizziness, hearing loss, neck pain, chest pain, palpitations, abdominal pain, or difficulty walking. Patient concerned for stroke given father has had a stroke.  The history is provided by the patient and medical records.  Neurologic Problem This is a new problem. The current episode started 1 to 2 hours ago. The problem has been resolved. Pertinent negatives include no chest pain, no abdominal pain, no headaches and no shortness of breath. Nothing aggravates the symptoms. Nothing relieves the symptoms. She has tried nothing for the symptoms.       Past Medical History:  Diagnosis Date  . Anxiety   . 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 RA (rheumatoid arthritis) [facility])     Patient Active Problem List   Diagnosis Date Noted  . Acute pain of right knee 08/20/2020  . High risk medication use 12/19/2019  . Strain of calf muscle 03/17/2018  . Rheumatoid arthritis involving multiple sites with positive rheumatoid factor (HCC) 05/03/2017  . Narcolepsy 09/15/2016  . 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.00)  09/19/2014    Past Surgical History:  Procedure Laterality Date  . CESAREAN SECTION     x2  . COLONOSCOPY  2009   family history of colon ca  . DILITATION & CURRETTAGE/HYSTROSCOPY WITH NOVASURE ABLATION  08/26/2012   Procedure: DILATATION & CURETTAGE/HYSTEROSCOPY WITH NOVASURE ABLATION;  Surgeon: Lenoard Aden, MD;  Location: [facility];  Service: Gynecology;  Laterality: N/A;  . lipoma     . WISDOM TOOTH EXTRACTION       OB History   No obstetric history on file.     Family History  Problem Relation Age of Onset  . Colon cancer Father        deceased  . Liver cancer Father 5       deceased   . Gout Brother     Social History   Tobacco Use  . Smoking status: Never Smoker  . Smokeless tobacco: Never Used  Vaping Use  . Vaping Use: Never used  Substance Use Topics  . Alcohol use: No  . Drug use: No    Home Medications Prior to Admission medications   Medication Sig Start Date End Date Taking? Authorizing Provider  Adalimumab (HUMIRA PEN) 40 MG/0.4ML PNKT Inject 40 mg into the skin every 14 (fourteen) days. 10/07/20  Yes Gearldine Bienenstock, PA-C  albuterol (PROVENTIL HFA;VENTOLIN HFA) 108 (90 BASE) MCG/ACT inhaler Inhale 2 puffs into the lungs every 6 (six) hours as needed. For exercise-induced bronchospasm   Yes [provider]  amphetamine-dextroamphetamine (ADDERALL) 10 MG tablet Take 10 mg by mouth as needed (alertness).   Yes [provider]  BIJUVA 1-100 MG CAPS Take 1 capsule by  mouth daily. 10/15/20  Yes [provider]  cholecalciferol (VITAMIN D) 1000 UNITS tablet Take 5,000 Units by mouth. Monday-Friday   Yes [provider]  ibuprofen (ADVIL) 800 MG tablet Take 1 tablet (800 mg total) by mouth every 8 (eight) hours as needed. Patient taking differently: Take 800 mg by mouth every 8 (eight) hours as needed for headache or mild pain. 03/05/19  Yes Petrucelli, Samantha R, PA-C  lisdexamfetamine (VYVANSE) 70 MG capsule Take 1 capsule  (70 mg total) by mouth daily. 11/11/20  Yes Dohmeier, Porfirio Mylar, MD  Multiple Vitamin (MULTIVITAMIN WITH MINERALS) TABS Take 1 tablet by mouth daily.   Yes [provider]  Omega-3 Fatty Acids (FISH OIL PO) Take 2,400 mg by mouth in the morning and at bedtime.   Yes [provider]  venlafaxine (EFFEXOR) 37.5 MG tablet Take 37.5 mg by mouth daily.   Yes [provider]    Allergies    Peg [polyethylene glycol]  Review of Systems   Review of Systems  Constitutional: Negative for chills and fever.  HENT: Negative for ear pain and sore throat.   Eyes: Negative for pain and visual disturbance.  Respiratory: Negative for cough and shortness of breath.   Cardiovascular: Negative for chest pain and palpitations.  Gastrointestinal: Negative for abdominal pain and vomiting.  Genitourinary: Negative for dysuria and hematuria.  Musculoskeletal: Negative for arthralgias and back pain.  Skin: Negative for color change and rash.  Neurological: Positive for facial asymmetry, speech difficulty and weakness. Negative for seizures, syncope and headaches.  All other systems reviewed and are negative.   Physical Exam Updated Vital Signs BP 100/63   Pulse 60   Temp 98.6 F (37 C) (Oral)   Resp 19   Ht  (1.6 m)   Wt 67.1 kg   SpO2 97%   BMI 26.22 kg/m   Physical Exam Vitals and nursing note reviewed.  Constitutional:      General: She is awake. She is not in acute distress.    Appearance: Normal appearance. She is well-developed, well-groomed and well-nourished. She is not ill-appearing.  HENT:     Head: Normocephalic and atraumatic.     Right Ear: External ear normal.     Left Ear: External ear normal.     Nose: Nose normal.     Mouth/Throat:     Pharynx: Oropharynx is clear. No oropharyngeal exudate or posterior oropharyngeal erythema.  Eyes:     General: No visual field deficit or scleral icterus.       Right eye: No discharge.        Left eye: No  discharge.     Extraocular Movements: Extraocular movements intact.     Conjunctiva/sclera: Conjunctivae normal.     Pupils: Pupils are equal, round, and reactive to light.  Cardiovascular:     Rate and Rhythm: Normal rate and regular rhythm.     Pulses: Normal pulses.     Heart sounds: Normal heart sounds. No murmur heard.   Pulmonary:     Effort: Pulmonary effort is normal. No respiratory distress.     Breath sounds: Normal breath sounds. No wheezing, rhonchi or rales.  Abdominal:     General: Abdomen is flat. There is no distension.     Palpations: Abdomen is soft.     Tenderness: There is no abdominal tenderness. There is no guarding or rebound.  Musculoskeletal:        General: No edema.     Cervical back: Neck  supple.  Skin:    General: Skin is warm and dry.     Findings: No rash.  Neurological:     General: No focal deficit present.     Mental Status: She is alert and oriented to person, place, and time.     GCS: GCS eye subscore is 4. GCS verbal subscore is 5. GCS motor subscore is 6.     Cranial Nerves: Cranial nerves are intact. No cranial nerve deficit, dysarthria or facial asymmetry.     Sensory: Sensation is intact. No sensory deficit.     Motor: Motor function is intact. No weakness or pronator drift.     Coordination: Coordination is intact. Finger-Nose-Finger Test and Heel to [facility] Test normal.  Psychiatric:        Mood and Affect: Mood and affect and mood normal.        Behavior: Behavior normal. Behavior is cooperative.     ED Results / Procedures / Treatments   Labs (all labs ordered are listed, but only abnormal results are displayed) Labs Reviewed  CBC - Abnormal; Notable for the following components:      Result Value   RBC 5.13 (*)    All other components within normal limits  COMPREHENSIVE METABOLIC PANEL - Abnormal; Notable for the following components:   Total Protein 6.4 (*)    All other components within normal limits  RAPID URINE DRUG  SCREEN, HOSP PERFORMED - Abnormal; Notable for the following components:   Amphetamines POSITIVE (*)    All other components within normal limits  URINALYSIS, ROUTINE W REFLEX MICROSCOPIC - Abnormal; Notable for the following components:   APPearance HAZY (*)    Hgb urine dipstick SMALL (*)    All other components within normal limits  RESP PANEL BY RT-PCR (FLU A&B, COVID) ARPGX2  ETHANOL  PROTIME-INR  APTT  DIFFERENTIAL  I-STAT CHEM 8, ED    EKG None  Radiology CT Angio Head W or Wo Contrast  Result Date: 11/21/2020 CLINICAL DATA:  Transient ischemic attacks.  Weakness. EXAM: CT ANGIOGRAPHY HEAD AND NECK TECHNIQUE: Multidetector CT imaging of the head and neck was performed using the standard protocol during bolus administration of intravenous contrast. Multiplanar CT image reconstructions and MIPs were obtained to evaluate the vascular anatomy. Carotid stenosis measurements (when applicable) are obtained utilizing NASCET criteria, using the distal internal carotid diameter as the denominator. CONTRAST:  91mL OMNIPAQUE IOHEXOL 350 MG/ML SOLN COMPARISON:  Head CT and MRI same day FINDINGS: CTA NECK FINDINGS Aortic arch: Aortic atherosclerotic calcification. No aneurysm or dissection. Branching pattern is normal without origin stenosis. Right carotid system: Common carotid artery widely patent to the bifurcation. Carotid bifurcation is normal without atherosclerotic plaque. No stenosis. Cervical ICA is normal. Left carotid system: Common carotid artery widely patent to the bifurcation. Carotid bifurcation is normal without atherosclerotic plaque. Cervical ICA is. Vertebral arteries: Both vertebral arteries widely patent at their origins and through the cervical region to the foramen magnum. Skeleton: Mild cervical spondylosis. Other neck: No mass or lymphadenopathy. Upper chest: Lung apices are clear. Review of the MIP images confirms the above findings CTA HEAD FINDINGS Anterior circulation:  Both internal carotid arteries are patent through the skull base and siphon regions. The anterior and middle cerebral vessels are patent without proximal stenosis, aneurysm or vascular malformation. No large or medium vessel occlusion. Posterior circulation: Both vertebral arteries widely patent to the basilar. No basilar stenosis. Posterior circulation branch vessels are normal. Venous sinuses: Patent and normal. Anatomic variants:  None significant. Review of the MIP images confirms the above findings IMPRESSION: 1. No intracranial large or medium vessel occlusion or correctable proximal stenosis. No aneurysm or vascular malformation. 2. Carotid arteries and vertebral arteries are normal. No bifurcation disease. 3. Patient does have some atherosclerosis the aorta. Aortic Atherosclerosis (ICD10-I70.0). Electronically Signed   By: Paulina Fusi M.D.   On: 11/21/2020 23:24   CT HEAD WO CONTRAST  Result Date: 11/21/2020 CLINICAL DATA:  Aphasia EXAM: CT HEAD WITHOUT CONTRAST TECHNIQUE: Contiguous axial images were obtained from the base of the skull through the vertex without intravenous contrast. COMPARISON:  None. FINDINGS: Brain: Ventricles and sulci are normal in size and configuration. Prominence of the cisterna magna is an anatomic variant. There is no intracranial mass, hemorrhage, extra-axial fluid collection, or midline shift. The brain parenchyma appears unremarkable. No acute infarct evident. Vascular: No hyperdense vessel. Minimal calcification noted in the carotid siphon region. Skull: Bony calvarium appears intact. Sinuses/Orbits: There is an apparent retention cyst in a posterior ethmoid air cell on the right. There is opacification in anterior left ethmoid air cell. Orbits appear symmetric bilaterally. Other: Mastoid air cells are clear. IMPRESSION: Normal appearing brain parenchyma. No mass or hemorrhage. Mild paranasal sinus disease. Minimal arterial vascular calcification noted. Electronically  Signed   By: Bretta Bang III M.D.   On: 11/21/2020 19:04   CT Angio Neck W and/or Wo Contrast  Result Date: 11/21/2020 CLINICAL DATA:  Transient ischemic attacks.  Weakness. EXAM: CT ANGIOGRAPHY HEAD AND NECK TECHNIQUE: Multidetector CT imaging of the head and neck was performed using the standard protocol during bolus administration of intravenous contrast. Multiplanar CT image reconstructions and MIPs were obtained to evaluate the vascular anatomy. Carotid stenosis measurements (when applicable) are obtained utilizing NASCET criteria, using the distal internal carotid diameter as the denominator. CONTRAST:  35mL OMNIPAQUE IOHEXOL 350 MG/ML SOLN COMPARISON:  Head CT and MRI same day FINDINGS: CTA NECK FINDINGS Aortic arch: Aortic atherosclerotic calcification. No aneurysm or dissection. Branching pattern is normal without origin stenosis. Right carotid system: Common carotid artery widely patent to the bifurcation. Carotid bifurcation is normal without atherosclerotic plaque. No stenosis. Cervical ICA is normal. Left carotid system: Common carotid artery widely patent to the bifurcation. Carotid bifurcation is normal without atherosclerotic plaque. Cervical ICA is. Vertebral arteries: Both vertebral arteries widely patent at their origins and through the cervical region to the foramen magnum. Skeleton: Mild cervical spondylosis. Other neck: No mass or lymphadenopathy. Upper chest: Lung apices are clear. Review of the MIP images confirms the above findings CTA HEAD FINDINGS Anterior circulation: Both internal carotid arteries are patent through the skull base and siphon regions. The anterior and middle cerebral vessels are patent without proximal stenosis, aneurysm or vascular malformation. No large or medium vessel occlusion. Posterior circulation: Both vertebral arteries widely patent to the basilar. No basilar stenosis. Posterior circulation branch vessels are normal. Venous sinuses: Patent and normal.  Anatomic variants: None significant. Review of the MIP images confirms the above findings IMPRESSION: 1. No intracranial large or medium vessel occlusion or correctable proximal stenosis. No aneurysm or vascular malformation. 2. Carotid arteries and vertebral arteries are normal. No bifurcation disease. 3. Patient does have some atherosclerosis the aorta. Aortic Atherosclerosis (ICD10-I70.0). Electronically Signed   By: Paulina Fusi M.D.   On: 11/21/2020 23:24   MR Brain Wo Contrast (neuro protocol)  Result Date: 11/21/2020 CLINICAL DATA:  Transient ischemic attack EXAM: MRI HEAD WITHOUT CONTRAST TECHNIQUE: Multiplanar, multiecho pulse sequences of the brain and  surrounding structures were obtained without intravenous contrast. COMPARISON:  None. FINDINGS: Brain: No acute infarct, mass effect or extra-axial collection. No acute or chronic hemorrhage. Normal white matter signal, parenchymal volume and CSF spaces. The midline structures are normal. Vascular: Major flow voids are preserved. Skull and upper cervical spine: Normal calvarium and skull base. Visualized upper cervical spine and soft tissues are normal. Sinuses/Orbits:No paranasal sinus fluid levels or advanced mucosal thickening. No mastoid or middle ear effusion. Normal orbits. IMPRESSION: Normal brain MRI. Electronically Signed   By: Deatra Robinson M.D.   On: 11/21/2020 22:04    Procedures Procedures  Medications Ordered in ED Medications  iohexol (OMNIPAQUE) 350 MG/ML injection 80 mL (80 mLs Intravenous Contrast Given 11/21/20 2313)    ED Course  I have reviewed the triage vital signs and the nursing notes.  Pertinent labs & imaging results that were available during my care of the patient were reviewed by me and considered in my medical decision making (see chart for details).    MDM Rules/Calculators/A&P                          Patient is a 54yoF with history and physical as described above who presents to the ED for weakness,  slurred speech, and facial droop that has resolved. VS reassuring and HDS. Non-focal neuro exam in ED. Initial workup started in triage with reassuring CT head; other labs reassuring. Discussed patient with Neurology who recommend obtaining MRI for further evaluation of possible CVA/TIA.  MRI unremarkable. Neurology recommends CTA head and neck. If reassuring, anticipate outpatient Neuro follow up as well as PCP follow up for echocardiogram and Holter monitor.  CTA head and neck unremarkable. Diagnostic workup not consistent with CVA, ICH, space occupying lesion, electrolyte derangement, or other acute emergent pathology at this time. Etiology for symptoms remain unclear at this time, but patient remained asymptomatic with no acute events during ED course. Further diagnostic workup to be done outpatient through PCP and Neurology. Discussed diagnostic results with patient as well as discharge plan. Patient verbalized understanding and amenable with discharge plan. Strict return precautions provided and discussed. Questions and concerns addressed. Patient discharged in stable condition.  Final Clinical Impression(s) / ED Diagnoses Final diagnoses:  Weakness    Rx / DC Orders ED Discharge Orders    None       Tonia Brooms, MD 11/22/20 0231    Gerhard Munch, MD 11/29/20 [phone]

## 2020-11-21 NOTE — ED Notes (Signed)
Pt transported to CT ?

## 2020-11-22 NOTE — ED Notes (Signed)
Patient verbalizes understanding of discharge instructions. Opportunity for questioning and answers were provided. Armband removed by staff, pt discharged from ED ambulatory.   

## 2020-11-25 ENCOUNTER — Encounter: Payer: Self-pay | Admitting: Neurology

## 2020-11-26 ENCOUNTER — Encounter: Payer: Self-pay | Admitting: Rheumatology

## 2020-11-27 ENCOUNTER — Other Ambulatory Visit: Payer: Self-pay | Admitting: *Deleted

## 2020-11-27 ENCOUNTER — Other Ambulatory Visit: Payer: Self-pay

## 2020-11-27 ENCOUNTER — Other Ambulatory Visit: Payer: Self-pay | Admitting: Neurology

## 2020-11-27 ENCOUNTER — Ambulatory Visit: Payer: BC Managed Care – PPO | Admitting: Neurology

## 2020-11-27 VITALS — BP 110/78 | HR 69 | Ht 63.0 in | Wt 148.5 lb

## 2020-11-27 DIAGNOSIS — G47419 Narcolepsy without cataplexy: Secondary | ICD-10-CM

## 2020-11-27 DIAGNOSIS — I700 Atherosclerosis of aorta: Secondary | ICD-10-CM | POA: Diagnosis not present

## 2020-11-27 DIAGNOSIS — E7801 Familial hypercholesterolemia: Secondary | ICD-10-CM | POA: Insufficient documentation

## 2020-11-27 DIAGNOSIS — E78019 Familial hypercholesterolemia, unspecified: Secondary | ICD-10-CM | POA: Insufficient documentation

## 2020-11-27 DIAGNOSIS — G459 Transient cerebral ischemic attack, unspecified: Secondary | ICD-10-CM

## 2020-11-27 MED ORDER — ASPIRIN EC 81 MG PO TBEC: 81.0000 mg | DELAYED_RELEASE_TABLET | Freq: Every day | ORAL | 11 refills | Status: DC

## 2020-11-27 NOTE — Progress Notes (Signed)
PATIENT: Kathleen Alvarez DOB: 03-04-1966  REASON FOR VISIT: follow up HISTORY FROM: patient  HISTORY OF PRESENT ILLNESS:  11/27/20: interval history , patient had COVID 19 in january , was mildly symptomatic, mostly GI, nauseated since.  She was on hormone replacement therapy,lost 8 pounds and started to sleep better and now experienced a TIA. ; I believe this was a TIA, she had speech arrest , right hand was floppy- uncontrolled and she felt numb- an she couldn't count-  all this during a work out- 5 minute episode.  Not a seizure presentation- HRT was immediately discontinued.  She was seen at the [facility] emergency room on 21 November 2020 within an hour of her experience.  A head CT and an MRI of the brain were both obtained Dr. Hennie Duos any aortic atherosclerotic changes, while widely patent carotid arteries noted she has a little bit of cervical spondylosis, her CTA showed all large and medium sized vessels to be flow-void.  Carotid arteries and vertebral arteries also were normal.  There was some atherosclerotic noted in the order that was the only significant finding.  As to the work-up in the emergency room the episode was deemed not having lasted longer in total as 1 minute maybe the the patient today thinks that it may have been within 5 minutes that she became aware of 3 different symptoms.  She also added that the coach present at the time of her workout noticed that her right face was droopy.  He had speech arrest, she had control loss of her right arm, she was unable to count and felt somewhat confused.  So I agree with the discontinuation of the hormone replacement therapy at this time but we are going to do is to add a cardiac monitor and I like for her to have a complete blood clotting lab evaluation.   10-09-2020. Interval history for this long -time established 55 year-old patient of mine with hypersomnia,seen here as a revisit for medication refill in the treatment of narcolepsy.  She attributes weight gain to menopause.  She is reporting a lot of fatigue. Her job has changed a lot with the pandemic. 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  She reports ongoing effect from Vyvanse, keeping her alert and safe and productive. She is now in perimenopause.  She is going through insulin resistance - her rheumatologist follows her for rheumatoid arthritis.  She has developed abdominal fat, and is frustrated about hat. She began craving carbs.  We are dicussing sleep aids at [facility]. Trazodone stands out for a patient with dry eyes and dry mouth, as it doesn't have these side effect.  During the pandemic her teaching has been on hold.    Kathleen Alvarez is a 55 year old female with a history of narcolepsy.  She returns today for follow-up.  She is currently on Vyvanse.  She reports that she tolerates this well.  This continues to work well for her.  She denies falling asleep at work or while driving.  Denies any heart palpitations or depression.  She states that she does have an Adderall prescription but she only uses this if she has an episode where she cannot stay awake.  Reports that she only uses  this approximately 5 times a year.  She denies any cataplectic events.  She returns today for evaluation.   HISTORY Kathleen Alvarez is a 55 y.o. female and  seen here as a revisit for medication refill in the treatment of narcolepsy.   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  She had this condition since college years and was first seen in this practice in 2007. For the treatment of for hypersomnia she was started on Provigil but  within 6 months she had noted some irritability and anger on that medication, this in addition was more complicated as the patient became pregnant.  Provigil was therefore discontinued and she was started on Adderall , Tolerated much better for about a year she was eventually changed to Vyvanse in 2008 . In April 2016 she was diagnosed with rheumatoid arthritis, takes MTX. this condition deepens her fatigue further.  She continues to do well on Vyvanse after over 7 years. She still sleeps 10-12 hours over a 24 hour period. She is not snoring. No apnea.   Interval history from 09/15/2016. I have the pleasure of seeing Dr. [name], PhD, for routine revisit. The patient introduced to tolerate Vyvanse which she will have taken for 10 years next year. Refills today. No evidence of hypertension, hypersomnia, hyperglycemia.  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  Her degree as an exercise physiology. She endorsed the Epworth sleepiness scale today still at 17 points without medicine and under the influence of Vyvanse around 11 points. We continue Vyvanse.  She has been on the medication now for 10 years. She has been sleepy all her adult life, since college, perhaps late years of high school.   REVIEW OF SYSTEMS: Out of a complete 14 system review of symptoms, the patient complains only of the following symptoms, and all other reviewed systems are negative. How likely are you to doze in the following situations: 0 = not likely, 1 = slight chance, 2 = moderate chance, 3 = high chance  Sitting and Reading?3 Watching Television? 2 Sitting inactive in a public place (theater or meeting)? 2 Lying down in the afternoon when circumstances permit?33 Sitting and talking to someone? 1 Sitting quietly after lunch without alcohol?2 In a car, while stopped for a few  minutes in traffic?2 As a passenger in a car for an hour without a break?3  Total = 12/ 24 (!)  FSS at 40/63  Insomnia with hot flushes.     ALLERGIES: Allergies  Allergen Reactions  . Peg [Polyethylene Glycol] Itching and Nausea And Vomiting    Used when had colonoscopy prep    HOME MEDICATIONS: Outpatient Medications Prior to Visit  Medication Sig Dispense Refill  . Adalimumab (HUMIRA PEN) 40 MG/0.4ML PNKT Inject 40 mg into the skin every 14 (fourteen) days. 6 each 0  . albuterol (PROVENTIL HFA;VENTOLIN HFA) 108 (90 BASE) MCG/ACT inhaler Inhale 2 puffs into the lungs every 6 (six) hours as needed. For exercise-induced bronchospasm    . amphetamine-dextroamphetamine (ADDERALL) 10 MG tablet Take 10 mg by mouth as needed (alertness).    Marland Kitchen BIJUVA 1-100 MG CAPS Take 1 capsule by mouth daily.    . cholecalciferol (VITAMIN D) 1000 UNITS tablet Take 5,000 Units by mouth. Monday-Friday    .  ibuprofen (ADVIL) 800 MG tablet Take 1 tablet (800 mg total) by mouth every 8 (eight) hours as needed. (Patient taking differently: Take 800 mg by mouth every 8 (eight) hours as needed for headache or mild pain.) 12 tablet 0  . lisdexamfetamine (VYVANSE) 70 MG capsule Take 1 capsule (70 mg total) by mouth daily. 90 capsule 0  . Multiple Vitamin (MULTIVITAMIN WITH MINERALS) TABS Take 1 tablet by mouth daily.    . Omega-3 Fatty Acids (FISH OIL PO) Take 2,400 mg by mouth in the morning and at bedtime.    Marland Kitchen venlafaxine (EFFEXOR) 37.5 MG tablet Take 37.5 mg by mouth daily.     No facility-administered medications prior to visit.    PAST MEDICAL HISTORY: Past Medical History:  Diagnosis Date  . Anxiety   . 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 RA (rheumatoid arthritis) (HCC)     PAST SURGICAL HISTORY: Past Surgical History:  Procedure Laterality Date  . CESAREAN SECTION     x2  . COLONOSCOPY  2009   family history of colon ca  . DILITATION & CURRETTAGE/HYSTROSCOPY WITH NOVASURE  ABLATION  08/26/2012   Procedure: DILATATION & CURETTAGE/HYSTEROSCOPY WITH NOVASURE ABLATION;  Surgeon: Lenoard Aden, MD;  Location: [facility];  Service: Gynecology;  Laterality: N/A;  . lipoma     . WISDOM TOOTH EXTRACTION      FAMILY HISTORY: Family History  Problem Relation Age of Onset  . Colon cancer Father        deceased  . Liver cancer Father 92       deceased   . Gout Brother     SOCIAL HISTORY: Social History   Socioeconomic History  . Marital status: Married    Spouse name: [name]   . Number of children: 2  . Years of education: 12+  . Highest education level: Not on file  Occupational History  . Occupation: Photographer: [employer]  Tobacco Use  . Smoking status: Never Smoker  . Smokeless tobacco: Never Used  Vaping Use  . Vaping Use: Never used  Substance and Sexual Activity  . Alcohol use: No  . Drug use: No  . Sexual activity: Not on file  Other Topics Concern  . Not on file  Social History Narrative   Patient is employed.    Patient married with 2 children.      Social Determinants of Health   Financial Resource Strain: Not on file  Food Insecurity: Not on file  Transportation Needs: Not on file  Physical Activity: Not on file  Stress: Not on file  Social Connections: Not on file  Intimate Partner Violence: Not on file      PHYSICAL EXAM  Vitals:   11/27/20 1458  BP: 110/78  Pulse: 69  SpO2: (!) 69%  Weight: 148 lb 8 oz (67.4 kg)  Height: 5\' 3"  (1.6 m)   Body mass index is 26.31 kg/m.  Generalized: Well developed, in no acute distress   Skin-  Dry and warm. No edema, no rash.  Posture- erect. BMI 27. Regular heart rate, no murmur, LCTA.   Neurological examination  Mentation:  No loss of taste or smell- Alert oriented to time, place, history taking.  Follows all commands her speech is clear and fluent Cranial nerves :  Extraocular movements were full, visual field were full on confrontational test. Facial  sensation and strength were normal. Head turning and shoulder shrug were normal and symmetric. Motor:  full  strength of all 4 extremities. No cog-wheeling, no spatsicity-   Good grip strength,  Hands have been stiff through rheumatoid arthritis.  Good symmetric motor tone is noted throughout.  Sensory:  intact to soft touch  and vibration,on all 4 extremities. No evidence of extinction is noted.  Coordination: finger to nose intact. No changes in penmanship   Gait and station:  Normal. She demonstrated good balance, turns with 3 steps, no limp. Straight posture.    DIAGNOSTIC DATA (LABS, IMAGING, TESTING) - I reviewed patient records, labs, notes, testing and imaging myself where available.  Vaccine documentation - Covid 19:  full vaccinated with 2 doses with Moderna , now boostered.   Lab Results  Component Value Date   WBC 8.3 11/21/2020   HGB 14.6 11/21/2020   HCT 43.0 11/21/2020   MCV 87.5 11/21/2020   PLT 378 11/21/2020      Component Value Date/Time   NA 140 11/21/2020 1925   NA 139 10/19/2019 1002   K 4.0 11/21/2020 1925   CL 103 11/21/2020 1925   CO2 27 11/21/2020 1839   GLUCOSE 84 11/21/2020 1925   BUN 16 11/21/2020 1925   BUN 13 10/19/2019 1002   CREATININE 0.70 11/21/2020 1925   CREATININE 0.85 10/03/2020 0000   CALCIUM 9.5 11/21/2020 1839   PROT 6.4 (L) 11/21/2020 1839   PROT 6.5 10/19/2019 1002   ALBUMIN 3.7 11/21/2020 1839   ALBUMIN 4.5 10/19/2019 1002   AST 36 11/21/2020 1839   ALT 35 11/21/2020 1839   ALKPHOS 73 11/21/2020 1839   BILITOT 0.9 11/21/2020 1839   BILITOT 0.6 10/19/2019 1002   GFRNONAA >60 11/21/2020 1839   GFRNONAA 79 10/03/2020 0000   GFRAA 91 10/03/2020 0000     ASSESSMENT AND PLAN 55 y.o. year old female  has a past medical history of Anxiety, 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.00) (09/19/2014), and RA (rheumatoid arthritis) (HCC). here with:  1. TIA one time and 5 minute duration, left brain- with speech arrest , inability to count,  inability to control the right arm.  Facial droop - she is not sure - was described as on the right, lasting 5 minutes.     2. Hormonal changes, perimenopausal insulin resistance and sleep disturbance- will try trazodone.  She is interested in  HRT , will discuss with Dr. Billy Coast. Now that she did well with weight and sleep, she unfortunaly had this event and she needs to d/c the therapy.   3.  I cannot rule out a post COVID contribution.    Cardiac monitor, blood clotting battery if deemed necessary by cardio. ,   She voiced understanding. She will follow-up in 6 month if all tests are negative  I spent 21 minutes with the patient. 50% of this time was spent discussing her medication.  Melvyn Novas, MD   11/27/2020, 3:29 PM [facility] [address], [address] [place], [place] [address] [phone]

## 2020-11-27 NOTE — Patient Instructions (Signed)
Transient Ischemic Attack A transient ischemic attack (TIA) causes the same symptoms as a stroke, but the symptoms go away quickly. A TIA happens when blood flow to the brain is blocked. Having a TIA means you may be at risk for a stroke. A TIA is a medical emergency. What are the causes? A TIA is caused by a blocked artery in the head or neck. This means the brain does not get the blood supply it needs. A blockage can be caused by:  Fatty buildup in an artery in the head or neck.  A blood clot.  A tear in an artery.  Irritation and swelling (inflammation) of an artery. Sometimes the cause is not known. What increases the risk? Certain things may make you more likely to have a TIA. Some of these are things that you can change, such as:  Being very overweight.  Using products that have nicotine or tobacco.  Taking birth control pills.  Not being active.  Drinking too much alcohol.  Using drugs. Health conditions that may increase your risk include:  High blood pressure.  High cholesterol.  Diabetes.  Heart disease.  A heartbeat that is not regular (atrial fibrillation).  Sickle cell disease.  Sleep problems (sleep apnea).  Long-term diseases that cause irritation and swelling.  Problems with blood clotting. Other risk factors include:  Being over the age of 60.  Being female.  Having a family history of stroke.  Having had blood clots, stroke, TIA, or heart attack in the past.  Having a history of high blood pressure when pregnant (preeclampsia).  Very bad headaches (migraines). What are the signs or symptoms? The symptoms of a TIA are like those of a stroke. They can include:  Weakness or loss of feeling in your face, arm, or leg. This often happens on one side of your body.  Trouble walking.  Trouble moving your arms or legs.  Trouble talking or understanding what people are saying.  Problems with how you see.  Feeling dizzy.  Feeling  confused.  Loss of balance or coordination.  Feeling like you may vomit (nausea) or you vomit.  Having a very bad headache. If you can, note what time you started to have symptoms. Tell your doctor.   How is this treated? The goal of treatment is to lower the risk for a stroke. This may include:  Changes to diet and lifestyle, such as getting regular exercise and stopping smoking.  Taking medicines to: ? Thin the blood. ? Lower blood pressure. ? Lower cholesterol.  Treating other health conditions, such as diabetes. If testing shows that an artery in your brain is narrow, your doctor may recommend a procedure to:  Take the blockage out of your artery (carotid endarterectomy).  Open or widen an artery in your neck (carotid angioplasty and stenting). Follow these instructions at home: Medicines  Take over-the-counter and prescription medicines only as told by your doctor.  If you were told to take aspirin or another medicine to thin your blood, take it exactly as told by your doctor. ? Taking too much of the medicine can cause bleeding. ? Taking too little of the medicine may not work to treat the problem. Eating and drinking  Eat 5 or more servings of fruits and vegetables each day.  Follow instructions from your doctor about your diet. You may need to follow a certain diet to help lower your risk of a stroke. You may need to: ? Eat a diet that is low   in fat and salt. ? Eat foods with a lot of fiber. ? Limit carbohydrates and sugar.  If you drink alcohol: ? Limit how much you have to:  0-1 drink a day for women who are not pregnant.  0-2 drinks a day for men. ? Know how much alcohol is in a drink. In the [place] one drink equals one 12 oz bottle of beer (355mL), one 5 oz glass of wine (148mL), or one 1 oz glass of hard liquor (44mL).   General instructions  Keep a healthy weight.  Try to get at least 30 minutes of exercise on most days.  Get treatment if you have  sleep problems.  Do not smoke or use any products that contain nicotine or tobacco. If you need help quitting, ask your doctor.  Do not use drugs.  Keep all follow-up visits. Where to find more information  American Stroke Association: www.stroke.org Get help right away if:  You have chest pain.  You have a heartbeat that is not regular.  You have any signs of a stroke. "BE FAST" is an easy way to remember the main warning signs: ? B - Balance. Dizziness, sudden trouble walking, or loss of balance. ? E - Eyes. Trouble seeing or a change in how you see. ? F - Face. Sudden weakness or loss of feeling of the face. The face or eyelid may droop on one side. ? A - Arms. Weakness or loss of feeling in an arm. This happens all of a sudden and most often on one side of the body. ? S - Speech. Sudden trouble speaking, slurred speech, or trouble understanding what people say. ? T - Time. Time to call emergency services. Write down what time symptoms started.  You have other signs of a stroke, such as: ? A sudden, very bad headache with no known cause. ? Feeling like you may vomit. ? Vomiting. ? A seizure. These symptoms may be an emergency. Get help right away. Call your local emergency services (911 in the [place]  Do not wait to see if the symptoms will go away.  Do not drive yourself to the hospital. Summary  A transient ischemic attack (TIA) happens when an artery in the head or neck is blocked. This causes the same symptoms as a stroke. The symptoms go away quickly.  A TIA is a medical emergency. Get help right away, even if your symptoms go away.  Having a TIA means that you may be at risk for a stroke. Taking medicines and making diet and lifestyle changes can help to prevent a stroke. This information is not intended to replace advice given to you by your health care provider. Make sure you discuss any questions you have with your health care provider. Document Revised:  04/16/2020 Document Reviewed: 04/16/2020 Elsevier Patient Education  2021 Elsevier Inc.  

## 2020-11-27 NOTE — Addendum Note (Signed)
Addended by: Judi Cong on: 11/27/2020 04:54 PM   Modules accepted: Orders

## 2020-11-28 LAB — LIPID PANEL
Chol/HDL Ratio: 3.7 ratio (ref 0.0–4.4)
Cholesterol, Total: 260 mg/dL — ABNORMAL HIGH (ref 100–199)
HDL: 71 mg/dL (ref >39)
LDL Chol Calc (NIH): 164 mg/dL — ABNORMAL HIGH (ref 0–99)
Triglycerides: 139 mg/dL (ref 0–149)
VLDL Cholesterol Cal: 25 mg/dL (ref 5–40)

## 2020-12-02 ENCOUNTER — Encounter: Payer: Self-pay | Admitting: Neurology

## 2020-12-02 NOTE — Progress Notes (Signed)
High total cholesterol and LDL, HDL is also elevated - but ration is not enough to be protective. Start with cholesterol reduced diet and consider statin if this is not effective.

## 2020-12-10 ENCOUNTER — Telehealth: Payer: Self-pay | Admitting: Neurology

## 2020-12-10 NOTE — Telephone Encounter (Signed)
Pt states she is being told by [facility] that her insurance needs to have it verified that she is supposed to get a 90 day and not a 30 day on her lisdexamfetamine (VYVANSE) 70 MG capsule

## 2020-12-11 ENCOUNTER — Other Ambulatory Visit: Payer: Self-pay | Admitting: Neurology

## 2020-12-11 DIAGNOSIS — G47419 Narcolepsy without cataplexy: Secondary | ICD-10-CM

## 2020-12-11 MED ORDER — LISDEXAMFETAMINE DIMESYLATE 70 MG PO CAPS: 70.0000 mg | ORAL_CAPSULE | Freq: Every day | ORAL | 0 refills | Status: DC

## 2020-12-11 NOTE — Telephone Encounter (Signed)
Cannot change to 90 day supply- "this medication is not e prescribed"

## 2020-12-11 NOTE — Telephone Encounter (Signed)
Pt has called to know if the medication would be called in for her. Message from [facility] from this morning was read to her.  Pt states the problem is she will soon run out of her (VYVANSE) .  Pt is asking if some can be [phone]

## 2020-12-11 NOTE — Telephone Encounter (Signed)
Called [facility] back, spoke with a different representative named Brook.  She is stated that all they had to do was submit a override for the 90-day supply.  They are submitting this request and it can take up to 3 days for someone to review and approve.  She indicated that the patient will receive a call to inform her of the status.  I also called the patient to provide this information to her.  There was no answer.  Left a detailed message advising of the information above.  Instructed the patient to call back with any questions.

## 2020-12-11 NOTE — Telephone Encounter (Signed)
I have [facility] the [facility] for [facility]-day supply.  I commented in the notes section to the pharmacy for them to contact [facility] if there is any problems with processing. Dr Vickey Huger will resend for the patient

## 2020-12-11 NOTE — Telephone Encounter (Signed)
I attempted to complete a PA for the patient for a 90-day supply.  At this time the response I received "Your PA has been resolved, no additional PA is required."Your PA has been resolved, no additional PA is required.   It may be that her insurance is requiring the patient to only get 30-day supply at a time. (most insurances have went to restricting)  I will contact the patient's insurance today and see if that is the information they provide.

## 2020-12-11 NOTE — Telephone Encounter (Signed)
Called the insurance # and they advise that I have to contact [facility] and discuss with them. They provided a number [phone] which connected me to the [facility] team.  Informed them that we are wanting to allow her to have a 90 day supply for 90 days. She attempted to run that and it would not allow to go through. When they submitted 30 for 30 it was paid. They did find out that she is allowed to get a 90 for 90 days supply, however she would not be due until April 27th to get a 90 day supply. Since the patient's med will be due earlier in the April month this will continue to throw that off track. They attempted to connect me to customer care to have a form completed to get her back on the correct schedule with her medication. By doing this it will allow her to get 90 day supply. I was transferred twice to this customer care department and it states please wait three times and then hung up on me.

## 2020-12-12 ENCOUNTER — Encounter: Payer: Self-pay | Admitting: Cardiology

## 2020-12-12 ENCOUNTER — Other Ambulatory Visit: Payer: Self-pay

## 2020-12-12 ENCOUNTER — Ambulatory Visit: Payer: BC Managed Care – PPO | Admitting: Cardiology

## 2020-12-12 VITALS — BP 112/75 | HR 86 | Temp 98.4°F | Resp 16 | Ht 63.0 in | Wt 147.6 lb

## 2020-12-12 DIAGNOSIS — E7800 Pure hypercholesterolemia, unspecified: Secondary | ICD-10-CM

## 2020-12-12 DIAGNOSIS — I700 Atherosclerosis of aorta: Secondary | ICD-10-CM

## 2020-12-12 DIAGNOSIS — G459 Transient cerebral ischemic attack, unspecified: Secondary | ICD-10-CM

## 2020-12-12 DIAGNOSIS — Z8616 Personal history of COVID-19: Secondary | ICD-10-CM

## 2020-12-12 MED ORDER — ROSUVASTATIN CALCIUM 5 MG PO TABS: 5.0000 mg | ORAL_TABLET | Freq: Every day | ORAL | 0 refills | Status: DC

## 2020-12-12 NOTE — Progress Notes (Signed)
Date:  12/12/2020   ID:  Kathleen Alvarez, DOB [birth year], MRN [id number]  PCP:  Brien Few, MD  Cardiologist:  Rex Kras, DO, [facility] (established care 12/12/2020)  REASON FOR CONSULT: TIA, aortic calcification, familial hypercholesterolemia.   REQUESTING PHYSICIAN:  Brien Few, MD [place],  [place] [address]  Chief Complaint  Patient presents with  . Transient Ischemic Attack  . Hospitalization Follow-up  . New Patient (Initial Visit)    HPI  Kathleen Alvarez is a 55 y.o. 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  She is referred to the office at the request of Brien Few, MD for evaluation of TIA, aortic calcification, hypercholesterolemia.  Patient recently presented to [facility] ER in February 2022 with a chief complaint of weakness and aphasia.  She was at the local gym and had symptoms of right hand weakness, inability to grab a jump rope, slurred speech, and right facial droop.  The symptoms lasted for approximately 1.5 minutes or less and spontaneously resolved.  Subsequently went to [facility] ED for further evaluation.  She underwent a CT of the head without contrast, MRI of the brain without contrast, and CTA head and neck.  Patient followed up with neurology after discharge and now referred to cardiology for further work-up and possible consideration of a cardiac monitor and echocardiogram.  Patient denies any chest pain or anginal equivalent.  Overall euvolemic.  Excellent functional capacity for age.  No family history of premature coronary disease or sudden cardiac death.  FUNCTIONAL STATUS: Regularly goes to the gym 3-4x week for atleast 1 - 1.5 hours.     ALLERGIES: Allergies  Allergen Reactions  . Peg [Polyethylene Glycol] Itching and Nausea And Vomiting    Used when had colonoscopy prep    MEDICATION LIST PRIOR TO VISIT: Current Meds  Medication Sig  . ACETYLCHOLINE CHLORIDE IO Take 1 capsule by mouth daily.  . Adalimumab (HUMIRA PEN) 40 MG/0.4ML PNKT Inject 40 mg into the skin every 14 (fourteen) days.  Marland Kitchen albuterol (PROVENTIL HFA;VENTOLIN HFA) 108 (90 BASE) MCG/ACT inhaler Inhale 2 puffs into the lungs every 6 (six) hours as needed. For exercise-induced bronchospasm  . amphetamine-dextroamphetamine (ADDERALL) 10 MG tablet Take 10 mg by mouth as needed (alertness).  Marland Kitchen aspirin EC 81 MG tablet Take 1 tablet (81 mg total) by mouth daily. Swallow whole.  . cholecalciferol (VITAMIN D) 1000 UNITS tablet Take 5,000 Units by mouth. Monday-Friday  . ibuprofen (ADVIL) 800 MG tablet Take 1 tablet (800 mg total) by mouth every 8 (eight) hours as needed. (Patient taking differently: Take 800 mg by mouth every 8 (eight) hours as needed for headache or mild pain.)  . lisdexamfetamine (VYVANSE) 70 MG capsule Take 1 capsule (70 mg total) by mouth daily.  . Multiple Vitamin (MULTIVITAMIN WITH MINERALS) TABS Take 1 tablet by mouth daily.  . NON FORMULARY Take 1 capsule by mouth daily. Tributyrin 350  . NON FORMULARY Take 1 capsule by mouth daily. Homocysteine  . NON FORMULARY Take 1 capsule by mouth daily. Essential aminos  . Omega-3 Fatty Acids (FISH OIL PO) Take 2,400 mg by mouth in the morning and at bedtime.  . Probiotic Product (PROBIOTIC DAILY PO) Take 1 capsule by mouth daily.  . rosuvastatin (CRESTOR) 5 MG tablet  Take 1 tablet (5 mg total) by mouth at bedtime.  Marland Kitchen venlafaxine (EFFEXOR) 37.5 MG tablet Take 37.5 mg by mouth daily.     PAST MEDICAL HISTORY: Past Medical History:  Diagnosis Date  . Anxiety   . Hyperlipidemia   . 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 RA (rheumatoid arthritis) ([place])   . TIA (transient ischemic  attack)     PAST SURGICAL HISTORY: Past Surgical History:  Procedure Laterality Date  . CESAREAN SECTION     x2  . COLONOSCOPY  2009   family history of colon ca  . DILITATION & CURRETTAGE/HYSTROSCOPY WITH NOVASURE ABLATION  08/26/2012   Procedure: DILATATION & CURETTAGE/HYSTEROSCOPY WITH NOVASURE ABLATION;  Surgeon: Lovenia Kim, MD;  Location: [facility];  Service: Gynecology;  Laterality: N/A;  . lipoma     . WISDOM TOOTH EXTRACTION      FAMILY HISTORY: The patient family history includes Colon cancer in her father; Gout in her brother; Liver cancer (age of onset: 68) in her father.  SOCIAL HISTORY:  The patient  reports that she has never smoked. She has never used smokeless tobacco. She reports that she does not drink alcohol and does not use drugs.  REVIEW OF SYSTEMS: Review of Systems  Constitutional: Negative for chills and fever.  HENT: Negative for hoarse voice and nosebleeds.   Eyes: Negative for discharge, double vision and pain.  Cardiovascular: Negative for chest pain, claudication, dyspnea on exertion, leg swelling, near-syncope, orthopnea, palpitations, paroxysmal nocturnal dyspnea and syncope.  Respiratory: Negative for hemoptysis and shortness of breath.   Musculoskeletal: Negative for muscle cramps and myalgias.  Gastrointestinal: Negative for abdominal pain, constipation, diarrhea, hematemesis, hematochezia, melena, nausea and vomiting.  Neurological: Negative for dizziness and light-headedness.   PHYSICAL EXAM: Vitals with BMI 12/12/2020 11/27/2020 11/21/2020  Height 5' 3"  5' 3"  -  Weight 147 lbs 10 oz 148 lbs 8 oz -  BMI 03.49 17.91 -  Systolic 505 697 948  Diastolic 75 78 63  Pulse 86 69 60    CONSTITUTIONAL: Well-developed and well-nourished. No acute distress.  SKIN: Skin is warm and dry. No rash noted. No cyanosis. No pallor. No jaundice HEAD: Normocephalic and atraumatic.  EYES: No scleral icterus MOUTH/THROAT: Moist oral membranes.  NECK: No  JVD present. No thyromegaly noted. No carotid bruits  LYMPHATIC: No visible cervical adenopathy.  CHEST Normal respiratory effort. No intercostal retractions  LUNGS: Clear to auscultation bilaterally.  No stridor. No wheezes. No rales.  CARDIOVASCULAR: Regular rate and rhythm, positive S1-S2, no murmurs rubs or gallops appreciated. ABDOMINAL: No apparent ascites.  EXTREMITIES: No peripheral edema  HEMATOLOGIC: No significant bruising NEUROLOGIC: Oriented to person, place, and time. Nonfocal. Normal muscle tone.  PSYCHIATRIC: Normal mood and affect. Normal behavior. Cooperative  RADIOLOGY: CT brain without contrast: 11/21/2020:Normal appearing brain parenchyma. No mass or hemorrhage. Mild paranasal sinus disease. Minimal arterial vascular calcification noted.  MRI brain without contrast: 11/21/2020: Normal brain MRI  CTA head and neck: 11/21/2020: 1. No intracranial large or medium vessel occlusion or correctable proximal stenosis. No aneurysm or vascular malformation. 2. Carotid arteries and vertebral arteries are normal. No bifurcation disease. 3. Patient does have some atherosclerosis the aorta.  CARDIAC DATABASE: EKG: 12/12/2020: Normal sinus rhythm, 79 bpm, normal axis, without underlying ischemia or injury pattern.  Echocardiogram: No results found for this or any previous visit from the past 1095 days.   Stress Testing: No results found for this or any previous visit from the past 1095 days.  Heart Catheterization: None   LABORATORY DATA: CBC Latest Ref Rng & Units 11/21/2020 11/21/2020 10/03/2020  WBC 4.0 - 10.5 K/uL - 8.3 10.9(H)  Hemoglobin 12.0 - 15.0 g/dL 14.6 14.7 15.2  Hematocrit 36.0 - 46.0 % 43.0 44.9 44.7  Platelets 150 - 400 K/uL - 378 346    CMP Latest Ref Rng & Units 11/21/2020 11/21/2020 10/03/2020  Glucose 70 - 99 mg/dL 84 84 94  BUN 6 - 20 mg/dL 16 14 19   Creatinine 0.44 - 1.00 mg/dL 0.70 0.79 0.85  Sodium 135 - 145 mmol/L 140 140 140  Potassium 3.5 -  5.1 mmol/L 4.0 4.0 4.9  Chloride 98 - 111 mmol/L 103 102 102  CO2 22 - 32 mmol/L - 27 29  Calcium 8.9 - 10.3 mg/dL - 9.5 10.2  Total Protein 6.5 - 8.1 g/dL - 6.4(L) 7.0  Total Bilirubin 0.3 - 1.2 mg/dL - 0.9 0.4  Alkaline Phos 38 - 126 U/L - 73 -  AST 15 - 41 U/L - 36 38(H)  ALT 0 - 44 U/L - 35 41(H)    Lipid Panel     Component Value Date/Time   CHOL 260 (H) 11/27/2020 1557   TRIG 139 11/27/2020 1557   HDL 71 11/27/2020 1557   CHOLHDL 3.7 11/27/2020 1557   LDLCALC 164 (H) 11/27/2020 1557   LABVLDL 25 11/27/2020 1557   BMP Recent Labs    03/15/20 1527 10/03/20 0000 11/21/20 1839 11/21/20 1925  NA 141 140 140 140  K 4.5 4.9 4.0 4.0  CL 103 102 102 103  CO2 30 29 27   --   GLUCOSE 98 94 84 84  BUN 15 19 14 16   CREATININE 0.82 0.85 0.79 0.70  CALCIUM 9.9 10.2 9.5  --   GFRNONAA 82 79 >60  --   GFRAA 95 91  --   --     HEMOGLOBIN A1C No results found for: HGBA1C, MPG  IMPRESSION:    ICD-10-CM   1. TIA (transient ischemic attack)  G45.9 EKG 12-Lead    PCV ECHOCARDIOGRAM COMPLETE    LONG TERM MONITOR-LIVE TELEMETRY (3-14 DAYS)    rosuvastatin (CRESTOR) 5 MG tablet  2. Aortic calcification (HCC)  I70.0 CT CARDIAC SCORING (SELF PAY ONLY)    rosuvastatin (CRESTOR) 5 MG tablet  3. Hypercholesterolemia  E78.00 CT CARDIAC SCORING (SELF PAY ONLY)    rosuvastatin (CRESTOR) 5 MG tablet    Lipid Panel With LDL/HDL Ratio    LDL cholesterol, direct    CMP14+EGFR  4. History of COVID-19  Z86.16      RECOMMENDATIONS: Keyri Salberg is a 55 y.o. female whose past medical history and cardiac risk factors include: History of COVID-19 infection, narcolepsy, rheumatoid arthritis, TIA (February 2022), aortic atherosclerosis, postmenopausal female.  Transient ischemic attack: Has had a thorough work-up at the ER and follows up with neurology. Currently on aspirin every other day. Recommended to be on aspirin every day. Given the recent TIA, elevated LDL levels, and aortic  atherosclerosis recommend statin therapy.  After a long discussion with regards to risks, benefits and alternatives the shared decision was to proceed with statin therapy. We will start Crestor 5 mg p.o. nightly.  Repeat fasting lipid profile and a CMP in 6 weeks for follow-up Recommend a 14-day mobile cardiac ambulatory telemetry to evaluate for atrial fibrillation. We also discussed loop recorder implantation but patient would like to defer at this time. Educated on secondary prevention.  Hypercholesterolemia: Check coronary artery calcium score for further risk  stratification Depending on coronary calcium score we will decide the modality of stress testing at the next visit. Initiate statin therapy as discussed above  Aortic atherosclerosis: Continue aspirin 81 mg p.o. daily.  Statin therapy.  As part of this consultation reviewed the records from her recent ER visit, reviewed the results of the CT brain without contrast, MRI of the brain without contrast, CTA head and neck, also reviewed Dr. Edwena Felty last office note, independently reviewed her blood work, discussed pathophysiology and management as noted above, and coordination of care.  Total time spent during today's encounter 60 minutes.  FINAL MEDICATION LIST END OF ENCOUNTER: Meds ordered this encounter  Medications  . rosuvastatin (CRESTOR) 5 MG tablet    Sig: Take 1 tablet (5 mg total) by mouth at bedtime.    Dispense:  30 tablet    Refill:  0    Medications Discontinued During This Encounter  Medication Reason  . BIJUVA 1-100 MG CAPS Error     Current Outpatient Medications:  .  ACETYLCHOLINE CHLORIDE IO, Take 1 capsule by mouth daily., Disp: , Rfl:  .  Adalimumab (HUMIRA PEN) 40 MG/0.4ML PNKT, Inject 40 mg into the skin every 14 (fourteen) days., Disp: 6 each, Rfl: 0 .  albuterol (PROVENTIL HFA;VENTOLIN HFA) 108 (90 BASE) MCG/ACT inhaler, Inhale 2 puffs into the lungs every 6 (six) hours as needed. For exercise-induced  bronchospasm, Disp: , Rfl:  .  amphetamine-dextroamphetamine (ADDERALL) 10 MG tablet, Take 10 mg by mouth as needed (alertness)., Disp: , Rfl:  .  aspirin EC 81 MG tablet, Take 1 tablet (81 mg total) by mouth daily. Swallow whole., Disp: 30 tablet, Rfl: 11 .  cholecalciferol (VITAMIN D) 1000 UNITS tablet, Take 5,000 Units by mouth. Monday-Friday, Disp: , Rfl:  .  ibuprofen (ADVIL) 800 MG tablet, Take 1 tablet (800 mg total) by mouth every 8 (eight) hours as needed. (Patient taking differently: Take 800 mg by mouth every 8 (eight) hours as needed for headache or mild pain.), Disp: 12 tablet, Rfl: 0 .  lisdexamfetamine (VYVANSE) 70 MG capsule, Take 1 capsule (70 mg total) by mouth daily., Disp: 90 capsule, Rfl: 0 .  Multiple Vitamin (MULTIVITAMIN WITH MINERALS) TABS, Take 1 tablet by mouth daily., Disp: , Rfl:  .  NON FORMULARY, Take 1 capsule by mouth daily. Tributyrin 350, Disp: , Rfl:  .  NON FORMULARY, Take 1 capsule by mouth daily. Homocysteine, Disp: , Rfl:  .  NON FORMULARY, Take 1 capsule by mouth daily. Essential aminos, Disp: , Rfl:  .  Omega-3 Fatty Acids (FISH OIL PO), Take 2,400 mg by mouth in the morning and at bedtime., Disp: , Rfl:  .  Probiotic Product (PROBIOTIC DAILY PO), Take 1 capsule by mouth daily., Disp: , Rfl:  .  rosuvastatin (CRESTOR) 5 MG tablet, Take 1 tablet (5 mg total) by mouth at bedtime., Disp: 30 tablet, Rfl: 0 .  venlafaxine (EFFEXOR) 37.5 MG tablet, Take 37.5 mg by mouth daily., Disp: , Rfl:   Orders Placed This Encounter  Procedures  . CT CARDIAC SCORING (SELF PAY ONLY)  . Lipid Panel With LDL/HDL Ratio  . LDL cholesterol, direct  . CMP14+EGFR  . LONG TERM MONITOR-LIVE TELEMETRY (3-14 DAYS)  . EKG 12-Lead  . PCV ECHOCARDIOGRAM COMPLETE    --Continue cardiac medications as reconciled in final medication list. --Return in about 4 weeks (around 01/09/2021) for Follow up TIA, started statin, Review test results. Or sooner if needed. --Continue follow-up with  your primary care physician  regarding the management of your other chronic comorbid conditions.  Patient's questions and concerns were addressed to her satisfaction. She voices understanding of the instructions provided during this encounter.   This note was created using a voice recognition software as a result there may be grammatical errors inadvertently enclosed that do not reflect the nature of this encounter. Every attempt is made to correct such errors.  Rex Kras, [place], [facility]  Pager: [phone] Office: [phone]

## 2020-12-13 ENCOUNTER — Inpatient Hospital Stay: Payer: BC Managed Care – PPO

## 2020-12-13 ENCOUNTER — Other Ambulatory Visit: Payer: Self-pay

## 2020-12-13 DIAGNOSIS — G459 Transient cerebral ischemic attack, unspecified: Secondary | ICD-10-CM

## 2020-12-13 DIAGNOSIS — E7800 Pure hypercholesterolemia, unspecified: Secondary | ICD-10-CM

## 2020-12-17 ENCOUNTER — Other Ambulatory Visit: Payer: Self-pay | Admitting: Physician Assistant

## 2020-12-17 DIAGNOSIS — M0579 Rheumatoid arthritis with rheumatoid factor of multiple sites without organ or systems involvement: Secondary | ICD-10-CM

## 2020-12-17 NOTE — Telephone Encounter (Signed)
Next Visit: 03/25/2021  Last Visit: 10/22/2020  Last Fill: 10/07/2020  [id number] arthritis involving multiple sites with positive rheumatoid factor   Current Dose per office note 10/22/2020, Humira 40 mg every 14 days  Labs: 11/21/2020, Total Protein 6.4, RBC 5.13, urinalysis hazy, Hgb urine dipstick small,   TB Gold: 10/03/2020,  negative  Okay to refill Humira?

## 2020-12-20 ENCOUNTER — Other Ambulatory Visit: Payer: Self-pay

## 2020-12-20 DIAGNOSIS — G459 Transient cerebral ischemic attack, unspecified: Secondary | ICD-10-CM

## 2020-12-25 ENCOUNTER — Institutional Professional Consult (permissible substitution): Payer: Self-pay | Admitting: Neurology

## 2020-12-26 ENCOUNTER — Ambulatory Visit: Payer: BC Managed Care – PPO

## 2020-12-26 ENCOUNTER — Inpatient Hospital Stay: Payer: BC Managed Care – PPO

## 2020-12-26 ENCOUNTER — Other Ambulatory Visit: Payer: Self-pay

## 2020-12-26 DIAGNOSIS — G459 Transient cerebral ischemic attack, unspecified: Secondary | ICD-10-CM

## 2021-01-04 ENCOUNTER — Other Ambulatory Visit: Payer: Self-pay | Admitting: Cardiology

## 2021-01-04 DIAGNOSIS — I700 Atherosclerosis of aorta: Secondary | ICD-10-CM

## 2021-01-04 DIAGNOSIS — E7800 Pure hypercholesterolemia, unspecified: Secondary | ICD-10-CM

## 2021-01-04 DIAGNOSIS — G459 Transient cerebral ischemic attack, unspecified: Secondary | ICD-10-CM

## 2021-01-15 LAB — CMP14+EGFR
ALT: 35 IU/L — ABNORMAL HIGH (ref 0–32)
AST: 35 IU/L (ref 0–40)
Albumin/Globulin Ratio: 2.4 — ABNORMAL HIGH (ref 1.2–2.2)
Albumin: 4.6 g/dL (ref 3.8–4.9)
Alkaline Phosphatase: 77 IU/L (ref 44–121)
BUN/Creatinine Ratio: 17 (ref 9–23)
BUN: 16 mg/dL (ref 6–24)
Bilirubin Total: 0.5 mg/dL (ref 0.0–1.2)
CO2: 27 mmol/L (ref 20–29)
Calcium: 10 mg/dL (ref 8.7–10.2)
Chloride: 101 mmol/L (ref 96–106)
Creatinine, Ser: 0.94 mg/dL (ref 0.57–1.00)
Globulin, Total: 1.9 g/dL (ref 1.5–4.5)
Glucose: 94 mg/dL (ref 65–99)
Potassium: 5.1 mmol/L (ref 3.5–5.2)
Sodium: 139 mmol/L (ref 134–144)
Total Protein: 6.5 g/dL (ref 6.0–8.5)
eGFR: 73 mL/min/{1.73_m2} (ref >59)

## 2021-01-15 LAB — LIPID PANEL WITH LDL/HDL RATIO
Cholesterol, Total: 217 mg/dL — ABNORMAL HIGH (ref 100–199)
HDL: 83 mg/dL (ref >39)
LDL Chol Calc (NIH): 116 mg/dL — ABNORMAL HIGH (ref 0–99)
LDL/HDL Ratio: 1.4 ratio (ref 0.0–3.2)
Triglycerides: 101 mg/dL (ref 0–149)
VLDL Cholesterol Cal: 18 mg/dL (ref 5–40)

## 2021-01-15 LAB — LDL CHOLESTEROL, DIRECT: LDL Direct: 116 mg/dL — ABNORMAL HIGH (ref 0–99)

## 2021-01-15 NOTE — Progress Notes (Deleted)
Date:  01/15/2021   ID:  [name], DOB 1966-02-28, MRN [id number]  PCP:  [name], MD  Cardiologist:  Tessa Lerner, DO, [facility] (established care 12/12/2020)  ***  No chief complaint on file.   HPI  Kathleen Alvarez is a 55 y.o. [place] descent female who is a professor at a local college presents to the office with a chief complaint of " ***." Patient's past medical history and cardiovascular risk factors include: History of COVID-19 infection, narcolepsy, rheumatoid arthritis, TIA (February 2022), aortic atherosclerosis, postmenopausal female.  She is referred to the office at the request of [name], MD for evaluation of TIA, aortic calcification, hypercholesterolemia.  Patient recently presented to [facility] ER in February 2022 with a chief complaint of weakness and aphasia.  She was at the local gym and had symptoms of right hand weakness, inability to grab a jump rope, slurred speech, and right facial droop.  The symptoms lasted for approximately 1.5 minutes or less and spontaneously resolved.  Subsequently went to [facility] ED for further evaluation.  She underwent a CT of the head without contrast, MRI of the brain without contrast, and CTA head and neck.  Patient followed up with neurology after discharge and now referred to cardiology for further work-up and possible consideration of a cardiac monitor and echocardiogram.  Patient denies any chest pain or anginal equivalent.  Overall euvolemic.  Excellent functional capacity for age.  No family history of premature coronary disease or sudden cardiac death.  FUNCTIONAL STATUS: Regularly goes to the gym 3-4x week for atleast 1 - 1.5 hours.    ALLERGIES: Allergies  Allergen Reactions  . Peg [Polyethylene Glycol] Itching and Nausea And Vomiting    Used when had colonoscopy prep    MEDICATION LIST PRIOR TO VISIT: No outpatient medications have been marked as taking for the 01/16/21 encounter (Appointment) with  Odis Hollingshead, [place], DO.     PAST MEDICAL HISTORY: Past Medical History:  Diagnosis Date  . Anxiety   . Hyperlipidemia   . 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 RA (rheumatoid arthritis) (HCC)   . TIA (transient ischemic attack)     PAST SURGICAL HISTORY: Past Surgical History:  Procedure Laterality Date  . CESAREAN SECTION     x2  . COLONOSCOPY  2009   family history of colon ca  . DILITATION & CURRETTAGE/HYSTROSCOPY WITH NOVASURE ABLATION  08/26/2012   Procedure: DILATATION & CURETTAGE/HYSTEROSCOPY WITH NOVASURE ABLATION;  Surgeon: Lenoard Aden, MD;  Location: [facility];  Service: Gynecology;  Laterality: N/A;  . lipoma     . WISDOM TOOTH EXTRACTION      FAMILY HISTORY: The patient family history includes Colon cancer in her father; Gout in her brother; Liver cancer (age of onset: 52) in her father.  SOCIAL HISTORY:  The patient  reports that she has never smoked. She has never used smokeless tobacco. She reports that she does not drink alcohol and does not use drugs.  REVIEW OF SYSTEMS: Review of Systems  Constitutional: Negative for chills and fever.  HENT: Negative for hoarse voice and nosebleeds.   Eyes: Negative for discharge, double vision and pain.  Cardiovascular: Negative for chest pain, claudication, dyspnea on exertion, leg swelling, near-syncope, orthopnea, palpitations, paroxysmal nocturnal dyspnea and syncope.  Respiratory: Negative for hemoptysis and shortness of breath.   Musculoskeletal: Negative for muscle cramps and myalgias.  Gastrointestinal: Negative for abdominal pain, constipation, diarrhea, hematemesis, hematochezia, melena, nausea and vomiting.  Neurological: Negative for dizziness and light-headedness.  PHYSICAL EXAM: Vitals with BMI 12/12/2020 11/27/2020 11/21/2020  Height 5\' 3"  5\' 3"  -  Weight 147 lbs 10 oz 148 lbs 8 oz -  BMI 26.15 26.31 -  Systolic 112 110 035  Diastolic 75 78 63  Pulse 86 69 60    CONSTITUTIONAL:  Well-developed and well-nourished. No acute distress.  SKIN: Skin is warm and dry. No rash noted. No cyanosis. No pallor. No jaundice HEAD: Normocephalic and atraumatic.  EYES: No scleral icterus MOUTH/THROAT: Moist oral membranes.  NECK: No JVD present. No thyromegaly noted. No carotid bruits  LYMPHATIC: No visible cervical adenopathy.  CHEST Normal respiratory effort. No intercostal retractions  LUNGS: Clear to auscultation bilaterally.  No stridor. No wheezes. No rales.  CARDIOVASCULAR: Regular rate and rhythm, positive S1-S2, no murmurs rubs or gallops appreciated. ABDOMINAL: No apparent ascites.  EXTREMITIES: No peripheral edema  HEMATOLOGIC: No significant bruising NEUROLOGIC: Oriented to person, place, and time. Nonfocal. Normal muscle tone.  PSYCHIATRIC: Normal mood and affect. Normal behavior. Cooperative  RADIOLOGY: CT brain without contrast: 11/21/2020:Normal appearing brain parenchyma. No mass or hemorrhage. Mild paranasal sinus disease. Minimal arterial vascular calcification noted.  MRI brain without contrast: 11/21/2020: Normal brain MRI  CTA head and neck: 11/21/2020: 1. No intracranial large or medium vessel occlusion or correctable proximal stenosis. No aneurysm or vascular malformation. 2. Carotid arteries and vertebral arteries are normal. No bifurcation disease. 3. Patient does have some atherosclerosis the aorta.  CARDIAC DATABASE: EKG: 12/12/2020: Normal sinus rhythm, 79 bpm, normal axis, without underlying ischemia or injury pattern.  Echocardiogram: 12/26/2020: Normal LV systolic function with EF 59%. Left ventricle cavity is normal in size.  Normal global wall motion. Normal diastolic filling pattern, normal LAP.  No significant valvular heart disease.  No prior study for comparison.   Stress Testing: No results found for this or any previous visit from the past 1095 days.   Heart Catheterization: None   Long term monitor (8-14 DAYS)  Patient had  a min HR of 42 bpm, max HR of 226 bpm, and avg HR of 76 bpm.  Predominant underlying rhythm was Sinus Rhythm. 36 Supraventricular Tachycardia runs occurred, the run with the fastest interval lasting 6 beats with a max rate of 226 bpm, the  longest lasting 14.7 secs with an avg rate of 92 bpm. Some episodes of Supraventricular Tachycardia may be possible Atrial Tachycardia with variable block.  Isolated SVEs were rare (<1.0%), SVE Couplets were rare (<1.0%), and SVE Triplets were rare  (<1.0%). Isolated VEs were rare (<1.0%), VE Couplets were rare (<1.0%), and no VE Triplets were present. Ventricular Bigeminy and Trigeminy were present.   LABORATORY DATA: CBC Latest Ref Rng & Units 11/21/2020 11/21/2020 10/03/2020  WBC 4.0 - 10.5 K/uL - 8.3 10.9(H)  Hemoglobin 12.0 - 15.0 g/dL 00.9 38.1 82.9  Hematocrit 36.0 - 46.0 % 43.0 44.9 44.7  Platelets 150 - 400 K/uL - 378 346    CMP Latest Ref Rng & Units 01/14/2021 11/21/2020 11/21/2020  Glucose 65 - 99 mg/dL 94 84 84  BUN 6 - 24 mg/dL 16 16 14   Creatinine 0.57 - 1.00 mg/dL 9.37 1.69 6.78  Sodium 134 - 144 mmol/L 139 140 140  Potassium 3.5 - 5.2 mmol/L 5.1 4.0 4.0  Chloride 96 - 106 mmol/L 101 103 102  CO2 20 - 29 mmol/L 27 - 27  Calcium 8.7 - 10.2 mg/dL 93.8 - 9.5  Total Protein 6.0 - 8.5 g/dL 6.5 - 6.4(L)  Total Bilirubin 0.0 - 1.2 mg/dL 0.5 -  0.9  Alkaline Phos 44 - 121 IU/L 77 - 73  AST 0 - 40 IU/L 35 - 36  ALT 0 - 32 IU/L 35(H) - 35    Lipid Panel     Component Value Date/Time   CHOL 217 (H) 01/14/2021 0842   TRIG 101 01/14/2021 0842   HDL 83 01/14/2021 0842   CHOLHDL 3.7 11/27/2020 1557   LDLCALC 116 (H) 01/14/2021 0842   LDLDIRECT 116 (H) 01/14/2021 0842   LABVLDL 18 01/14/2021 0842   BMP Recent Labs    03/15/20 1527 10/03/20 0000 11/21/20 1839 11/21/20 1925 01/14/21 0843  NA 141 140 140 140 139  K 4.5 4.9 4.0 4.0 5.1  CL 103 102 102 103 101  CO2 30 29 27   --  27  GLUCOSE 98 94 84 84 94  BUN 15 19 14 16 16    CREATININE 0.82 0.85 0.79 0.70 0.94  CALCIUM 9.9 10.2 9.5  --  10.0  GFRNONAA 82 79 >60  --   --   GFRAA 95 91  --   --   --     HEMOGLOBIN A1C No results found for: HGBA1C, MPG  IMPRESSION:  No diagnosis found.   RECOMMENDATIONS: Kathleen Alvarez is a 55 y.o. female whose past medical history and cardiac risk factors include: History of COVID-19 infection, narcolepsy, rheumatoid arthritis, TIA (February 2022), aortic atherosclerosis, postmenopausal female.  Transient ischemic attack: Has had a thorough work-up at the ER and follows up with neurology. Currently on aspirin every other day. Recommended to be on aspirin every day. Given the recent TIA, elevated LDL levels, and aortic atherosclerosis recommend statin therapy.  After a long discussion with regards to risks, benefits and alternatives the shared decision was to proceed with statin therapy. We will start Crestor 5 mg p.o. nightly.  Repeat fasting lipid profile and a CMP in 6 weeks for follow-up Recommend a 14-day mobile cardiac ambulatory telemetry to evaluate for atrial fibrillation. We also discussed loop recorder implantation but patient would like to defer at this time. Educated on secondary prevention.  Hypercholesterolemia: Check coronary artery calcium score for further risk stratification Depending on coronary calcium score we will decide the modality of stress testing at the next visit. Initiate statin therapy as discussed above  Aortic atherosclerosis: Continue aspirin 81 mg p.o. daily.  Statin therapy.  As part of this consultation reviewed the records from her recent ER visit, reviewed the results of the CT brain without contrast, MRI of the brain without contrast, CTA head and neck, also reviewed Dr. 40 last office note, independently reviewed her blood work, discussed pathophysiology and management as noted above, and coordination of care.  Total time spent during today's encounter 60 minutes.  FINAL  MEDICATION LIST END OF ENCOUNTER: No orders of the defined types were placed in this encounter.   There are no discontinued medications.   Current Outpatient Medications:  .  ACETYLCHOLINE CHLORIDE IO, Take 1 capsule by mouth daily., Disp: , Rfl:  .  albuterol (PROVENTIL HFA;VENTOLIN HFA) 108 (90 BASE) MCG/ACT inhaler, Inhale 2 puffs into the lungs every 6 (six) hours as needed. For exercise-induced bronchospasm, Disp: , Rfl:  .  amphetamine-dextroamphetamine (ADDERALL) 10 MG tablet, Take 10 mg by mouth as needed (alertness)., Disp: , Rfl:  .  aspirin EC 81 MG tablet, Take 1 tablet (81 mg total) by mouth daily. Swallow whole., Disp: 30 tablet, Rfl: 11 .  cholecalciferol (VITAMIN D) 1000 UNITS tablet, Take 5,000 Units by mouth. Monday-Friday, Disp: ,  Rfl:  .  HUMIRA PEN 40 MG/0.4ML PNKT, INJECT 1 PEN UNDER THE SKIN EVERY 14 DAYS., Disp: 6 each, Rfl: 0 .  ibuprofen (ADVIL) 800 MG tablet, Take 1 tablet (800 mg total) by mouth every 8 (eight) hours as needed. (Patient taking differently: Take 800 mg by mouth every 8 (eight) hours as needed for headache or mild pain.), Disp: 12 tablet, Rfl: 0 .  lisdexamfetamine (VYVANSE) 70 MG capsule, Take 1 capsule (70 mg total) by mouth daily., Disp: 90 capsule, Rfl: 0 .  Multiple Vitamin (MULTIVITAMIN WITH MINERALS) TABS, Take 1 tablet by mouth daily., Disp: , Rfl:  .  NON FORMULARY, Take 1 capsule by mouth daily. Tributyrin 350, Disp: , Rfl:  .  NON FORMULARY, Take 1 capsule by mouth daily. Homocysteine, Disp: , Rfl:  .  NON FORMULARY, Take 1 capsule by mouth daily. Essential aminos, Disp: , Rfl:  .  Omega-3 Fatty Acids (FISH OIL PO), Take 2,400 mg by mouth in the morning and at bedtime., Disp: , Rfl:  .  Probiotic Product (PROBIOTIC DAILY PO), Take 1 capsule by mouth daily., Disp: , Rfl:  .  rosuvastatin (CRESTOR) 5 MG tablet, TAKE 1 TABLET BY MOUTH EVERYDAY AT BEDTIME, Disp: 30 tablet, Rfl: 0 .  venlafaxine (EFFEXOR) 37.5 MG tablet, Take 37.5 mg by mouth  daily., Disp: , Rfl:   No orders of the defined types were placed in this encounter.   --Continue cardiac medications as reconciled in final medication list. --No follow-ups on file. Or sooner if needed. --Continue follow-up with your primary care physician regarding the management of your other chronic comorbid conditions.  Patient's questions and concerns were addressed to her satisfaction. She voices understanding of the instructions provided during this encounter.   This note was created using a voice recognition software as a result there may be grammatical errors inadvertently enclosed that do not reflect the nature of this encounter. Every attempt is made to correct such errors.  Tessa Lerner, [place], [facility]  Pager: [phone] Office: [phone]

## 2021-01-16 ENCOUNTER — Ambulatory Visit: Payer: BC Managed Care – PPO | Admitting: Cardiology

## 2021-01-16 DIAGNOSIS — G459 Transient cerebral ischemic attack, unspecified: Secondary | ICD-10-CM

## 2021-01-16 DIAGNOSIS — I700 Atherosclerosis of aorta: Secondary | ICD-10-CM

## 2021-01-16 DIAGNOSIS — E7800 Pure hypercholesterolemia, unspecified: Secondary | ICD-10-CM

## 2021-01-16 DIAGNOSIS — Z8616 Personal history of COVID-19: Secondary | ICD-10-CM

## 2021-02-02 ENCOUNTER — Other Ambulatory Visit: Payer: Self-pay | Admitting: Cardiology

## 2021-02-02 DIAGNOSIS — G459 Transient cerebral ischemic attack, unspecified: Secondary | ICD-10-CM

## 2021-02-02 DIAGNOSIS — I700 Atherosclerosis of aorta: Secondary | ICD-10-CM

## 2021-02-02 DIAGNOSIS — E7800 Pure hypercholesterolemia, unspecified: Secondary | ICD-10-CM

## 2021-02-07 ENCOUNTER — Ambulatory Visit: Payer: BC Managed Care – PPO | Admitting: Cardiology

## 2021-02-07 ENCOUNTER — Encounter: Payer: Self-pay | Admitting: Cardiology

## 2021-02-07 ENCOUNTER — Other Ambulatory Visit: Payer: Self-pay

## 2021-02-07 VITALS — BP 108/67 | HR 65 | Temp 98.1°F | Resp 16 | Ht 63.0 in | Wt 148.6 lb

## 2021-02-07 DIAGNOSIS — Z8616 Personal history of COVID-19: Secondary | ICD-10-CM

## 2021-02-07 DIAGNOSIS — I700 Atherosclerosis of aorta: Secondary | ICD-10-CM

## 2021-02-07 DIAGNOSIS — G459 Transient cerebral ischemic attack, unspecified: Secondary | ICD-10-CM

## 2021-02-07 DIAGNOSIS — E7800 Pure hypercholesterolemia, unspecified: Secondary | ICD-10-CM

## 2021-02-07 NOTE — Progress Notes (Signed)
Date:  02/07/2021   ID:  Rayburn Go, DOB [birth year], MRN [id number]  PCP:  Brien Few, MD  Cardiologist:  Rex Kras, DO, [facility] (established care 12/12/2020)  Date: 02/07/21 Last Office Visit: 12/12/2020  Chief Complaint  Patient presents with  . Transient Ischemic Attack  . Results  . Follow-up    HPI  Kathleen Alvarez is a 55 y.o. 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  She is referred to the office at the request of Brien Few, MD for evaluation of TIA, aortic calcification, hypercholesterolemia.  Patient recently presented to [facility] ER in February 2022 with a chief complaint of weakness and aphasia.  She was at the local gym and had symptoms of right hand weakness, inability to grab a jump rope, slurred speech, and right facial droop.  The symptoms lasted for approximately 1.5 minutes or less and spontaneously resolved.  Subsequently went to [facility] ED for further evaluation.  She underwent a CT of the head without contrast, MRI of the brain without contrast, and CTA head and neck.  After being seen by neurology patient was referred to cardiology for further evaluation and management.  Since last office visit patient has undergone an echocardiogram and a 14-day extended Holter monitor results were reviewed with her in great detail and noted below for further reference.  She was also started on aspirin and statin therapy given her lipid profile and recent TIA.  She had a repeat blood work in April 2022 which notes improvement in her LDL but still it is not at goal.  Patient is educated that a goal LDL less than 70 mg/dL is desirable given her history.  From a clinical aspect no  recurrence of TIA symptoms, no chest pain at rest or with effort late activities, overall euvolemic and not in congestive heart failure.  Patient continues to have excellent functional capacity for age and exercises on a regular basis.  No family history of premature coronary disease or sudden cardiac death.  FUNCTIONAL STATUS: Regularly goes to the gym 3-4x week for atleast 1 - 1.5 hours.    ALLERGIES: Allergies  Allergen Reactions  . Peg [Polyethylene Glycol] Itching and Nausea And Vomiting    Used when had colonoscopy prep    MEDICATION LIST PRIOR TO VISIT: Current Meds  Medication Sig  . albuterol (PROVENTIL HFA;VENTOLIN HFA) 108 (90 BASE) MCG/ACT inhaler Inhale 2 puffs into the lungs every 6 (six) hours as needed. For exercise-induced bronchospasm  . amphetamine-dextroamphetamine (ADDERALL) 10 MG tablet Take 10 mg by mouth as needed (alertness).  Marland Kitchen aspirin EC 81 MG tablet Take 1 tablet (81 mg total) by mouth daily. Swallow whole.  . cholecalciferol (VITAMIN D) 1000 UNITS tablet Take 5,000 Units by mouth. Monday-Friday  . HUMIRA PEN 40 MG/0.4ML PNKT INJECT 1 PEN UNDER THE SKIN EVERY 14 DAYS.  Marland Kitchen ibuprofen (ADVIL) 800 MG tablet Take 1 tablet (800 mg total) by mouth every 8 (eight) hours as needed. (Patient taking differently: Take 800 mg by mouth every 8 (eight) hours as needed for headache or mild pain.)  . lisdexamfetamine (VYVANSE) 70 MG capsule Take 1 capsule (70 mg total) by mouth daily.  . Multiple Vitamin (MULTIVITAMIN WITH MINERALS) TABS Take 1 tablet by mouth daily.  . NON FORMULARY  Take 1 capsule by mouth daily. Tributyrin 350  . NON FORMULARY Take 1 capsule by mouth daily. Homocysteine  . NON FORMULARY Take 1 capsule by mouth daily. Essential aminos  . Omega-3 Fatty Acids (FISH OIL PO) Take 2,400 mg by mouth in the morning and at bedtime.  . Probiotic Product (PROBIOTIC DAILY PO) Take 1 capsule by mouth daily.  . rosuvastatin (CRESTOR) 5 MG tablet TAKE 1 TABLET BY MOUTH  EVERYDAY AT BEDTIME  . venlafaxine (EFFEXOR) 37.5 MG tablet Take 37.5 mg by mouth daily.     PAST MEDICAL HISTORY: Past Medical History:  Diagnosis Date  . Anxiety   . Hyperlipidemia   . 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 RA (rheumatoid arthritis) ([facility])   . TIA (transient ischemic attack)     PAST SURGICAL HISTORY: Past Surgical History:  Procedure Laterality Date  . CESAREAN SECTION     x2  . COLONOSCOPY  2009   family history of colon ca  . DILITATION & CURRETTAGE/HYSTROSCOPY WITH NOVASURE ABLATION  08/26/2012   Procedure: DILATATION & CURETTAGE/HYSTEROSCOPY WITH NOVASURE ABLATION;  Surgeon: Lovenia Kim, MD;  Location: [facility];  Service: Gynecology;  Laterality: N/A;  . lipoma     . WISDOM TOOTH EXTRACTION      FAMILY HISTORY: The patient family history includes Colon cancer in her father; Gout in her brother; Liver cancer (age of onset: 36) in her father.  SOCIAL HISTORY:  The patient  reports that she has never smoked. She has never used smokeless tobacco. She reports that she does not drink alcohol and does not use drugs.  REVIEW OF SYSTEMS: Review of Systems  Constitutional: Negative for chills and fever.  HENT: Negative for hoarse voice and nosebleeds.   Eyes: Negative for discharge, double vision and pain.  Cardiovascular: Negative for chest pain, claudication, dyspnea on exertion, leg swelling, near-syncope, orthopnea, palpitations, paroxysmal nocturnal dyspnea and syncope.  Respiratory: Negative for hemoptysis and shortness of breath.   Musculoskeletal: Negative for muscle cramps and myalgias.  Gastrointestinal: Negative for abdominal pain, constipation, diarrhea, hematemesis, hematochezia, melena, nausea and vomiting.  Neurological: Negative for dizziness and light-headedness.   PHYSICAL EXAM: Vitals with BMI 02/07/2021 12/12/2020 11/27/2020  Height 5' 3" 5' 3" 5' 3"  Weight 148 lbs 10 oz 147 lbs 10 oz 148 lbs 8 oz  BMI 26.33 29.51 88.41   Systolic 660 630 160  Diastolic 67 75 78  Pulse 65 86 69    CONSTITUTIONAL: Well-developed and well-nourished. No acute distress.  SKIN: Skin is warm and dry. No rash noted. No cyanosis. No pallor. No jaundice HEAD: Normocephalic and atraumatic.  EYES: No scleral icterus MOUTH/THROAT: Moist oral membranes.  NECK: No JVD present. No thyromegaly noted. No carotid bruits  LYMPHATIC: No visible cervical adenopathy.  CHEST Normal respiratory effort. No intercostal retractions  LUNGS: Clear to auscultation bilaterally.  No stridor. No wheezes. No rales.  CARDIOVASCULAR: Regular rate and rhythm, positive S1-S2, no murmurs rubs or gallops appreciated. ABDOMINAL: No apparent ascites.  EXTREMITIES: No peripheral edema  HEMATOLOGIC: No significant bruising NEUROLOGIC: Oriented to person, place, and time. Nonfocal. Normal muscle tone.  PSYCHIATRIC: Normal mood and affect. Normal behavior. Cooperative  RADIOLOGY: CT brain without contrast: 11/21/2020:Normal appearing brain parenchyma. No mass or hemorrhage. Mild paranasal sinus disease. Minimal arterial vascular calcification noted.  MRI brain without contrast: 11/21/2020: Normal brain MRI  CTA head and neck: 11/21/2020: 1. No intracranial large or medium vessel occlusion or correctable proximal stenosis. No aneurysm or vascular malformation. 2.  Carotid arteries and vertebral arteries are normal. No bifurcation disease. 3. Patient does have some atherosclerosis the aorta.  CARDIAC DATABASE: EKG: 12/12/2020: Normal sinus rhythm, 79 bpm, normal axis, without underlying ischemia or injury pattern.  Echocardiogram: 12/26/2020: Normal LV systolic function with EF 59%. Left ventricle cavity is normal in size. Normal global wall motion. Normal diastolic filling pattern, normal LAP. No significant valvular heart disease. No prior study for comparison.   Stress Testing: No results found for this or any previous visit from the past 1095  days.  Heart Catheterization: None   14 day extended Holter monitor: Dominant rhythm normal sinus rhythm. Heart rate 42 - 226bpm. Avg HR 76 bpm. No atrial fibrillation, nonsustained ventricular tachycardia, high grade AV block, pauses (3 seconds or longer). Total ventricular ectopic burden <1%.  Total supraventricular ectopic burden <1%.  Fastest episode was 5 beats in duration at an average heart rate of 211 bpm for 1.8 seconds. Patient triggered events: 2. Underlying rhythm normal sinus with rare SVE.    LABORATORY DATA: CBC Latest Ref Rng & Units 11/21/2020 11/21/2020 10/03/2020  WBC 4.0 - 10.5 K/uL - 8.3 10.9(H)  Hemoglobin 12.0 - 15.0 g/dL 14.6 14.7 15.2  Hematocrit 36.0 - 46.0 % 43.0 44.9 44.7  Platelets 150 - 400 K/uL - 378 346    CMP Latest Ref Rng & Units 01/14/2021 11/21/2020 11/21/2020  Glucose 65 - 99 mg/dL 94 84 84  BUN 6 - 24 mg/dL _0 Creatinine 0.57 - 1.00 mg/dL 0.94 0.70 0.79  Sodium 134 - 144 mmol/L 139 140 140  Potassium 3.5 - 5.2 mmol/L 5.1 4.0 4.0  Chloride 96 - 106 mmol/L 101 103 102  CO2 20 - 29 mmol/L 27 - 27  Calcium 8.7 - 10.2 mg/dL 10.0 - 9.5  Total Protein 6.0 - 8.5 g/dL 6.5 - 6.4(L)  Total Bilirubin 0.0 - 1.2 mg/dL 0.5 - 0.9  Alkaline Phos 44 - 121 IU/L 77 - 73  AST 0 - 40 IU/L 35 - 36  ALT 0 - 32 IU/L 35(H) - 35    Lipid Panel     Component Value Date/Time   CHOL 217 (H) 01/14/2021 0842   TRIG 101 01/14/2021 0842   HDL 83 01/14/2021 0842   CHOLHDL 3.7 11/27/2020 1557   LDLCALC 116 (H) 01/14/2021 0842   LDLDIRECT 116 (H) 01/14/2021 0842   LABVLDL 18 01/14/2021 0842   BMP Recent Labs    03/15/20 1527 10/03/20 0000 11/21/20 1839 11/21/20 1925 01/14/21 0843  NA 141 140 140 140 139  K 4.5 4.9 4.0 4.0 5.1  CL 103 102 102 103 101  CO2 _1 --  27  GLUCOSE 98 94 84 84 94  BUN _2 CREATININE 0.82 0.85 0.79 0.70 0.94  CALCIUM 9.9 10.2 9.5  --  10.0  GFRNONAA 82 79 >60  --   --   GFRAA 95 91  --   --   --      HEMOGLOBIN A1C No results found for: HGBA1C, MPG  IMPRESSION:    ICD-10-CM   1. TIA (transient ischemic attack)  G45.9 PCV MYOCARDIAL PERFUSION WO LEXISCAN    Lipid Panel With LDL/HDL Ratio    LDL cholesterol, direct    CMP14+EGFR  2. Aortic calcification (HCC)  I70.0 PCV MYOCARDIAL PERFUSION WO LEXISCAN  3. Hypercholesterolemia  E78.00 PCV MYOCARDIAL PERFUSION WO LEXISCAN  4. History of COVID-19  Z86.16      RECOMMENDATIONS: Kathleen  Alvarez is a 55 y.o. female whose past medical history and cardiac risk factors include: History of COVID-19 infection, narcolepsy, rheumatoid arthritis, TIA (February 2022), aortic atherosclerosis, postmenopausal female.  Transient ischemic attack: No recurrence of symptoms since last visit. Continue aspirin and statin therapy. Independently reviewed labs from 01/14/2021. Reviewed the results of the echo and 14-day extended Holter monitor with the patient results noted above for further reference  Recommend a goal LDL of less than 70 mg/dL given her TIA symptoms.   Educated on secondary prevention.  Hypercholesterolemia: Patient is willing to continue aspirin and statin therapy as discussed above.  Therefore would recommend holding off on performing coronary calcium score. Continue Crestor. Most recent lipid profile discussed and reviewed, 01/14/2021 Consider additional lifestyle modifications, consider addition of red yeast rice, will repeat lipid profile in 3 months to see if additional pharmacological therapy is needed.  Patient is agreeable with the plan of care. Will check CMP, and fasting lipid profile prior to next visit.  Aortic atherosclerosis: Continue aspirin 81 mg p.o. daily.  Statin therapy.  Given her recent presentation of TIA and hyperlipidemia recommend ischemic evaluation.  Plan to schedule for a stress test prior to the next office visit.  FINAL MEDICATION LIST END OF ENCOUNTER: No orders of the defined types were placed in this  encounter.   There are no discontinued medications.   Current Outpatient Medications:  .  albuterol (PROVENTIL HFA;VENTOLIN HFA) 108 (90 BASE) MCG/ACT inhaler, Inhale 2 puffs into the lungs every 6 (six) hours as needed. For exercise-induced bronchospasm, Disp: , Rfl:  .  amphetamine-dextroamphetamine (ADDERALL) 10 MG tablet, Take 10 mg by mouth as needed (alertness)., Disp: , Rfl:  .  aspirin EC 81 MG tablet, Take 1 tablet (81 mg total) by mouth daily. Swallow whole., Disp: 30 tablet, Rfl: 11 .  cholecalciferol (VITAMIN D) 1000 UNITS tablet, Take 5,000 Units by mouth. Monday-Friday, Disp: , Rfl:  .  HUMIRA PEN 40 MG/0.4ML PNKT, INJECT 1 PEN UNDER THE SKIN EVERY 14 DAYS., Disp: 6 each, Rfl: 0 .  ibuprofen (ADVIL) 800 MG tablet, Take 1 tablet (800 mg total) by mouth every 8 (eight) hours as needed. (Patient taking differently: Take 800 mg by mouth every 8 (eight) hours as needed for headache or mild pain.), Disp: 12 tablet, Rfl: 0 .  lisdexamfetamine (VYVANSE) 70 MG capsule, Take 1 capsule (70 mg total) by mouth daily., Disp: 90 capsule, Rfl: 0 .  Multiple Vitamin (MULTIVITAMIN WITH MINERALS) TABS, Take 1 tablet by mouth daily., Disp: , Rfl:  .  NON FORMULARY, Take 1 capsule by mouth daily. Tributyrin 350, Disp: , Rfl:  .  NON FORMULARY, Take 1 capsule by mouth daily. Homocysteine, Disp: , Rfl:  .  NON FORMULARY, Take 1 capsule by mouth daily. Essential aminos, Disp: , Rfl:  .  Omega-3 Fatty Acids (FISH OIL PO), Take 2,400 mg by mouth in the morning and at bedtime., Disp: , Rfl:  .  Probiotic Product (PROBIOTIC DAILY PO), Take 1 capsule by mouth daily., Disp: , Rfl:  .  rosuvastatin (CRESTOR) 5 MG tablet, TAKE 1 TABLET BY MOUTH EVERYDAY AT BEDTIME, Disp: 30 tablet, Rfl: 0 .  venlafaxine (EFFEXOR) 37.5 MG tablet, Take 37.5 mg by mouth daily., Disp: , Rfl:  .  ACETYLCHOLINE CHLORIDE IO, Take 1 capsule by mouth daily., Disp: , Rfl:   Orders Placed This Encounter  Procedures  . Lipid Panel With  LDL/HDL Ratio  . LDL cholesterol, direct  . CMP14+EGFR  . PCV  MYOCARDIAL PERFUSION WO LEXISCAN    --Continue cardiac medications as reconciled in final medication list. --Return in about 3 months (around 05/10/2021) for Follow up, Lipid, Hx of TIA. Or sooner if needed. --Continue follow-up with your primary care physician regarding the management of your other chronic comorbid conditions.  Patient's questions and concerns were addressed to her satisfaction. She voices understanding of the instructions provided during this encounter.   This note was created using a voice recognition software as a result there may be grammatical errors inadvertently enclosed that do not reflect the nature of this encounter. Every attempt is made to correct such errors.  Rex Kras, [place], [facility]  Pager: [phone] Office: [phone]

## 2021-02-13 ENCOUNTER — Ambulatory Visit: Payer: BC Managed Care – PPO | Admitting: Cardiology

## 2021-02-14 ENCOUNTER — Encounter: Payer: Self-pay | Admitting: Neurology

## 2021-02-18 ENCOUNTER — Ambulatory Visit
Admission: RE | Admit: 2021-02-18 | Discharge: 2021-02-18 | Disposition: A | Discharge status: Home / Self Care | Payer: No Typology Code available for payment source | Source: Ambulatory Visit | Attending: Cardiology | Admitting: Cardiology

## 2021-02-18 DIAGNOSIS — E7800 Pure hypercholesterolemia, unspecified: Secondary | ICD-10-CM

## 2021-02-18 DIAGNOSIS — I700 Atherosclerosis of aorta: Secondary | ICD-10-CM

## 2021-02-20 ENCOUNTER — Ambulatory Visit: Payer: BC Managed Care – PPO | Admitting: Neurology

## 2021-03-01 ENCOUNTER — Other Ambulatory Visit: Payer: Self-pay | Admitting: Cardiology

## 2021-03-01 DIAGNOSIS — I700 Atherosclerosis of aorta: Secondary | ICD-10-CM

## 2021-03-01 DIAGNOSIS — G459 Transient cerebral ischemic attack, unspecified: Secondary | ICD-10-CM

## 2021-03-01 DIAGNOSIS — E7800 Pure hypercholesterolemia, unspecified: Secondary | ICD-10-CM

## 2021-03-11 NOTE — Progress Notes (Signed)
Office Visit Note  Patient: Kathleen Alvarez             Date of Birth: [birth year]           MRN: [id number]             PCP: [name], MD Referring: [name], MD Visit Date: 03/25/2021 Occupation: @GUAROCC @  Subjective:  Medication management.   History of Present Illness: Mecia Kraft is a 55 y.o. female with a history of seropositive rheumatoid arthritis.  She states she has been doing well on Humira every other week.  She had a flare last October when she tapered the dose of Humira.  She denies any morning stiffness or joint pain or joint swelling currently.  She had a TIA in February 2022.  She had neurology work-up and cardiology work-up after that.  She was placed on Crestor 5 mg p.o. daily.  She has not noticed any increased muscle pain or weakness.  She is been tolerating her medications well.  She has been exercising on a regular basis and also monitors her diet.  Activities of Daily Living:  Patient reports morning stiffness for 0 minutes.   Patient Denies nocturnal pain.  Difficulty dressing/grooming: Denies Difficulty climbing stairs: Denies Difficulty getting out of chair: Denies Difficulty using hands for taps, buttons, cutlery, and/or writing: Denies  Review of Systems  Constitutional:  Negative for fatigue.  HENT:  Negative for mouth sores, mouth dryness and nose dryness.   Eyes:  Negative for pain, itching, visual disturbance and dryness.  Respiratory:  Negative for cough, hemoptysis, shortness of breath and difficulty breathing.   Cardiovascular:  Negative for chest pain, palpitations and swelling in legs/feet.  Gastrointestinal:  Negative for abdominal pain, blood in stool, constipation and diarrhea.  Endocrine: Negative for increased urination.  Genitourinary:  Negative for painful urination.  Musculoskeletal:  Negative for joint pain, joint pain, joint swelling, myalgias, muscle weakness, morning stiffness, muscle tenderness and myalgias.  Skin:   Negative for color change, rash and redness.  Allergic/Immunologic: Negative for susceptible to infections.  Neurological:  Negative for dizziness, numbness, headaches, memory loss and weakness.  Hematological:  Negative for swollen glands.  Psychiatric/Behavioral:  Negative for confusion and sleep disturbance.    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    Past Medical History:  Diagnosis Date   Anxiety    Hyperlipidemia    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   RA (rheumatoid arthritis) (HCC)    TIA (transient ischemic attack)     Family History  Problem Relation Age of Onset   Colon cancer Father        deceased   Liver cancer Father 68       deceased    Gout Brother    Past Surgical History:  Procedure Laterality Date   CESAREAN SECTION     x2   COLONOSCOPY  2009   family history of colon ca   DILITATION & CURRETTAGE/HYSTROSCOPY WITH NOVASURE ABLATION  08/26/2012   Procedure: DILATATION & CURETTAGE/HYSTEROSCOPY WITH NOVASURE ABLATION;  Surgeon: Lenoard Aden, MD;  Location: [facility];  Service: Gynecology;  Laterality: N/A;   lipoma      WISDOM TOOTH EXTRACTION     Social History   Social History Narrative   Patient is employed.    Patient married with 2 children.      Immunization History  Administered Date(s) Administered   Influenza, Seasonal, Injecte, Preservative Fre 08/31/2014   Influenza,inj,Quad PF,6+ Mos 08/22/2019,  09/19/2020   Moderna Sars-Covid-2 Vaccination 12/14/2019, 01/16/2020, 09/23/2020     Objective: Vital Signs: BP 105/73 (BP Location: Left Arm, Patient Position: Sitting, Cuff Size: Normal)   Pulse 85   Ht 5\' 3"  (1.6 m)   Wt 144 lb 9.6 oz (65.6 kg)   BMI 25.61 kg/m    Physical Exam Vitals and nursing note reviewed.  Constitutional:      Appearance: She is well-developed.  HENT:     Head: Normocephalic and atraumatic.  Eyes:     Conjunctiva/sclera: Conjunctivae normal.  Cardiovascular:     Rate and Rhythm: Normal rate and regular rhythm.     Heart sounds: Normal heart sounds.  Pulmonary:     Effort: Pulmonary effort is normal.     Breath sounds: Normal breath sounds.  Abdominal:     General: Bowel sounds are normal.     Palpations: Abdomen is soft.  Musculoskeletal:     Cervical back: Normal range of motion.  Lymphadenopathy:     Cervical: No cervical adenopathy.  Skin:    General: Skin is warm and dry.     Capillary Refill: Capillary refill takes less than 2 seconds.  Neurological:     Mental Status: She is alert and oriented to person, place, and time.  Psychiatric:        Behavior: Behavior normal.     Musculoskeletal Exam: Spine was in good range of motion.  Shoulder joints, elbow joints, wrist joints, MCPs PIPs and DIPs with good range of motion with no synovitis.  Hip joints, knee joints, ankles, MTPs and PIPs with good range of motion with no synovitis.  CDAI Exam: CDAI Score: 0  Patient Global: 0 mm; Provider Global: 0 mm Swollen: 0 ; Tender: 0  Joint Exam 03/25/2021   No joint exam has been documented for this visit   There is currently no information documented on the homunculus. Go to the Rheumatology activity and complete the homunculus joint exam.  Investigation: No additional findings.  Imaging: No results found.   Recent Labs: Lab Results  Component Value Date   WBC 8.3 11/21/2020   HGB 14.6 11/21/2020   PLT 378 11/21/2020   NA 139  01/14/2021   K 5.1 01/14/2021   CL 101 01/14/2021   CO2 27 01/14/2021   GLUCOSE 94 01/14/2021   BUN 16 01/14/2021   CREATININE 0.94 01/14/2021   BILITOT 0.5 01/14/2021   ALKPHOS 77 01/14/2021   AST 35 01/14/2021   ALT 35 (H) 01/14/2021   PROT 6.5 01/14/2021   ALBUMIN 4.6 01/14/2021   CALCIUM 10.0 01/14/2021   GFRAA 91 10/03/2020   QFTBGOLDPLUS NEGATIVE 10/03/2020    Speciality Comments: No specialty comments available.  Procedures:  No procedures performed Allergies: Peg [polyethylene glycol]   Assessment / Plan:     Visit Diagnoses: Rheumatoid arthritis involving multiple sites with positive rheumatoid factor (HCC) - +RF, +CCP.  She had no synovitis on my examination.  She had good range of motion in all of her joints.  High risk medication use - Humira  40 mg every 14 days. -Labs in February 2022 were normal except for mild elevation of LFTs.  TB gold was negative in December 2021.  Plan: CBC with Differential/Platelet, COMPLETE METABOLIC PANEL WITH GFR today and then every 3 months to monitor for drug toxicity.  Patient is fully vaccinated against COVID-19.  Updated information regarding vaccination was provided and placed in the AVS.  She has been advised to stop Humira in case she develops an infection and then restart Humira once the infection resolves.  TIA (transient ischemic attack) - February 2022.  She was placed on Crestor.  Dyslipidemia-she has elevated LDL.  She monitors her diet and also exercise on a regular basis.  She has been followed by cardiology.  Other fatigue-due to narcolepsy.  Narcolepsy due to underlying condition without cataplexy  Attention deficit hyperactivity disorder (ADHD), predominantly inattentive type  Postmenopausal - her bone density done in August 2019 was within normal limits.  Orders: Orders Placed This Encounter  Procedures   CBC with Differential/Platelet   COMPLETE METABOLIC PANEL WITH GFR    No orders of the defined types  were placed in this encounter.    Follow-Up Instructions: Return in about 5 months (around 08/25/2021) for Rheumatoid arthritis.   Pollyann Savoy, MD  Note - This record has been created using Animal nutritionist.  Chart creation errors have been sought, but may not always  have been located. Such creation errors do not reflect on  the standard of medical care.

## 2021-03-14 ENCOUNTER — Telehealth: Payer: Self-pay | Admitting: Neurology

## 2021-03-14 NOTE — Telephone Encounter (Signed)
Pt request refill lisdexamfetamine (VYVANSE) 70 MG capsule at [facility] [phone]

## 2021-03-16 ENCOUNTER — Encounter: Payer: Self-pay | Admitting: Neurology

## 2021-03-17 ENCOUNTER — Other Ambulatory Visit: Payer: Self-pay | Admitting: Neurology

## 2021-03-17 MED ORDER — LISDEXAMFETAMINE DIMESYLATE 70 MG PO CAPS: 70.0000 mg | ORAL_CAPSULE | Freq: Every day | ORAL | 0 refills | Status: DC

## 2021-03-17 NOTE — Telephone Encounter (Signed)
I have routed this request to Dr Dohmeier for review. The pt is due for the medication and [place] registry was verified.  

## 2021-03-25 ENCOUNTER — Encounter: Payer: Self-pay | Admitting: Rheumatology

## 2021-03-25 ENCOUNTER — Other Ambulatory Visit: Payer: Self-pay

## 2021-03-25 ENCOUNTER — Ambulatory Visit: Payer: BC Managed Care – PPO | Admitting: Rheumatology

## 2021-03-25 VITALS — BP 105/73 | HR 85 | Ht 63.0 in | Wt 144.6 lb

## 2021-03-25 DIAGNOSIS — Z79899 Other long term (current) drug therapy: Secondary | ICD-10-CM

## 2021-03-25 DIAGNOSIS — G459 Transient cerebral ischemic attack, unspecified: Secondary | ICD-10-CM

## 2021-03-25 DIAGNOSIS — E785 Hyperlipidemia, unspecified: Secondary | ICD-10-CM

## 2021-03-25 DIAGNOSIS — G47429 Narcolepsy in conditions classified elsewhere without cataplexy: Secondary | ICD-10-CM

## 2021-03-25 DIAGNOSIS — M0579 Rheumatoid arthritis with rheumatoid factor of multiple sites without organ or systems involvement: Secondary | ICD-10-CM

## 2021-03-25 DIAGNOSIS — Z780 Asymptomatic menopausal state: Secondary | ICD-10-CM

## 2021-03-25 DIAGNOSIS — R5383 Other fatigue: Secondary | ICD-10-CM

## 2021-03-25 DIAGNOSIS — F900 Attention-deficit hyperactivity disorder, predominantly inattentive type: Secondary | ICD-10-CM

## 2021-03-25 DIAGNOSIS — G4709 Other insomnia: Secondary | ICD-10-CM

## 2021-03-25 NOTE — Patient Instructions (Signed)
Standing Labs We placed an order today for your standing lab work.   Please have your standing labs drawn in September and every 3 months  If possible, please have your labs drawn 2 weeks prior to your appointment so that the provider can discuss your results at your appointment.  Please note that you may see your imaging and lab results in MyChart before we have reviewed them. We may be awaiting multiple results to interpret others before contacting you. Please allow our office up to 72 hours to thoroughly review all of the results before contacting the office for clarification of your results.  We have open lab daily: Monday through Thursday from 1:30-4:30 PM and Friday from 1:30-4:00 PM at the office of Dr. [place] Flett, [facility].   Please be advised, all patients with office appointments requiring lab work will take precedent over walk-in lab work.  If possible, please come for your lab work on Monday and Friday afternoons, as you may experience shorter wait times. The office is located at [address], [address], [place], [place] [address] No appointment is necessary.   Labs are drawn by [facility]. Please bring your co-pay at the time of your lab draw.  You may receive a bill from [facility] for your lab work.  If you wish to have your labs drawn at another location, please call the office 24 hours in advance to send orders.  If you have any questions regarding directions or hours of operation,  please call [phone].   As a reminder, please drink plenty of water prior to coming for your lab work. Thanks!   Vaccines You are taking a medication(s) that can suppress your immune system.  The following immunizations are recommended: Flu annually Covid-19  Td/Tdap (tetanus, diphtheria, pertussis) every 10 years Pneumonia (Prevnar 15 then Pneumovax 23 at least 1 year apart.  Alternatively, can take Prevnar 20 without needing additional dose) Shingrix (after age 50): 2  doses from 4 weeks to 6 months apart  Please check with your PCP to make sure you are up to date.   If you test POSITIVE for COVID19 and have MILD to MODERATE symptoms: First, call your PCP if you would like to receive COVID19 treatment AND Hold your medications during the infection and for at least 1 week after your symptoms have resolved: Injectable medication (Benlysta, Cimzia, Cosentyx, Enbrel, Humira, Orencia, Remicade, Simponi, Stelara, Taltz, Tremfya) Methotrexate Leflunomide (Arava) Mycophenolate (Cellcept) Xeljanz, Olumiant, or Rinvoq If you take Actemra or Kevzara, you DO NOT need to hold these for COVID19 infection.  If you test POSITIVE for COVID19 and have NO symptoms: First, call your PCP if you would like to receive COVID19 treatment AND Hold your medications for at least 10 days after the day that you tested positive Injectable medication (Benlysta, Cimzia, Cosentyx, Enbrel, Humira, Orencia, Remicade, Simponi, Stelara, Taltz, Tremfya) Methotrexate Leflunomide (Arava) Mycophenolate (Cellcept) Xeljanz, Olumiant, or Rinvoq If you take Actemra or Kevzara, you DO NOT need to hold these for COVID19 infection.  If you have signs or symptoms of an infection or start antibiotics: First, call your PCP for workup of your infection. Hold your medication through the infection, until you complete your antibiotics, and until symptoms resolve if you take the following: Injectable medication (Actemra, Benlysta, Cimzia, Cosentyx, Enbrel, Humira, Kevzara, Orencia, Remicade, Simponi, Stelara, Taltz, Tremfya) Methotrexate Leflunomide (Arava) Mycophenolate (Cellcept) Xeljanz, Olumiant, or Rinvoq  Heart Disease Prevention   Your inflammatory disease increases your risk of heart disease which includes   heart attack, stroke, atrial fibrillation (irregular heartbeats), high blood pressure, heart failure and atherosclerosis (plaque in the arteries).  It is important to reduce your risk by:    Keep blood pressure, cholesterol, and blood sugar at healthy levels   Smoking Cessation   Maintain a healthy weight  BMI 20-25   Eat a healthy diet  Plenty of fresh fruit, vegetables, and whole grains  Limit saturated fats, foods high in sodium, and added sugars  DASH and Mediterranean diet   Increase physical activity  Recommend moderate physically activity for 150 minutes per week/ 30 minutes a day for five days a week These can be broken up into three separate ten-minute sessions during the day.   Reduce Stress  Meditation, slow breathing exercises, yoga, coloring books  Dental visits twice a year   

## 2021-03-26 LAB — CBC WITH DIFFERENTIAL/PLATELET
Absolute Monocytes: 614 cells/uL (ref 200–950)
Basophils Absolute: 98 cells/uL (ref 0–200)
Basophils Relative: 1.1 %
Eosinophils Absolute: 71 cells/uL (ref 15–500)
Eosinophils Relative: 0.8 %
HCT: 48.1 % — ABNORMAL HIGH (ref 35.0–45.0)
Hemoglobin: 15.9 g/dL — ABNORMAL HIGH (ref 11.7–15.5)
Lymphs Abs: 2572 cells/uL (ref 850–3900)
MCH: 29 pg (ref 27.0–33.0)
MCHC: 33.1 g/dL (ref 32.0–36.0)
MCV: 87.6 fL (ref 80.0–100.0)
MPV: 10.5 fL (ref 7.5–12.5)
Monocytes Relative: 6.9 %
Neutro Abs: 5545 cells/uL (ref 1500–7800)
Neutrophils Relative %: 62.3 %
Platelets: 333 10*3/uL (ref 140–400)
RBC: 5.49 10*6/uL — ABNORMAL HIGH (ref 3.80–5.10)
RDW: 11.6 % (ref 11.0–15.0)
Total Lymphocyte: 28.9 %
WBC: 8.9 10*3/uL (ref 3.8–10.8)

## 2021-03-26 LAB — COMPLETE METABOLIC PANEL WITH GFR
AG Ratio: 1.8 (calc) (ref 1.0–2.5)
ALT: 25 U/L (ref 6–29)
AST: 29 U/L (ref 10–35)
Albumin: 4.8 g/dL (ref 3.6–5.1)
Alkaline phosphatase (APISO): 60 U/L (ref 37–153)
BUN: 18 mg/dL (ref 7–25)
CO2: 30 mmol/L (ref 20–32)
Calcium: 10.5 mg/dL — ABNORMAL HIGH (ref 8.6–10.4)
Chloride: 103 mmol/L (ref 98–110)
Creat: 0.91 mg/dL (ref 0.50–1.05)
GFR, Est African American: 83 mL/min/{1.73_m2} (ref >=60)
GFR, Est Non African American: 72 mL/min/{1.73_m2} (ref >=60)
Globulin: 2.6 g/dL (calc) (ref 1.9–3.7)
Glucose, Bld: 78 mg/dL (ref 65–99)
Potassium: 5.3 mmol/L (ref 3.5–5.3)
Sodium: 141 mmol/L (ref 135–146)
Total Bilirubin: 0.5 mg/dL (ref 0.2–1.2)
Total Protein: 7.4 g/dL (ref 6.1–8.1)

## 2021-03-26 NOTE — Progress Notes (Signed)
Hemoglobin is mildly elevated not concerning.  Calcium is mildly elevated.  Please advise patient to avoid calcium supplements.

## 2021-03-31 ENCOUNTER — Other Ambulatory Visit: Payer: Self-pay | Admitting: Cardiology

## 2021-03-31 DIAGNOSIS — I700 Atherosclerosis of aorta: Secondary | ICD-10-CM

## 2021-03-31 DIAGNOSIS — G459 Transient cerebral ischemic attack, unspecified: Secondary | ICD-10-CM

## 2021-03-31 DIAGNOSIS — E7800 Pure hypercholesterolemia, unspecified: Secondary | ICD-10-CM

## 2021-04-11 ENCOUNTER — Other Ambulatory Visit: Payer: BC Managed Care – PPO

## 2021-04-14 ENCOUNTER — Ambulatory Visit: Payer: BC Managed Care – PPO

## 2021-04-14 ENCOUNTER — Other Ambulatory Visit: Payer: Self-pay

## 2021-04-14 DIAGNOSIS — I700 Atherosclerosis of aorta: Secondary | ICD-10-CM

## 2021-04-14 DIAGNOSIS — E7800 Pure hypercholesterolemia, unspecified: Secondary | ICD-10-CM

## 2021-04-14 DIAGNOSIS — G459 Transient cerebral ischemic attack, unspecified: Secondary | ICD-10-CM

## 2021-04-27 ENCOUNTER — Other Ambulatory Visit: Payer: Self-pay | Admitting: Cardiology

## 2021-04-27 DIAGNOSIS — E7800 Pure hypercholesterolemia, unspecified: Secondary | ICD-10-CM

## 2021-04-27 DIAGNOSIS — I700 Atherosclerosis of aorta: Secondary | ICD-10-CM

## 2021-04-27 DIAGNOSIS — G459 Transient cerebral ischemic attack, unspecified: Secondary | ICD-10-CM

## 2021-04-30 ENCOUNTER — Other Ambulatory Visit: Payer: Self-pay | Admitting: Cardiology

## 2021-05-01 LAB — CMP14+EGFR
ALT: 27 IU/L (ref 0–32)
AST: 32 IU/L (ref 0–40)
Albumin/Globulin Ratio: 2.1 (ref 1.2–2.2)
Albumin: 4.4 g/dL (ref 3.8–4.9)
Alkaline Phosphatase: 64 IU/L (ref 44–121)
BUN/Creatinine Ratio: 15 (ref 9–23)
BUN: 12 mg/dL (ref 6–24)
Bilirubin Total: 0.3 mg/dL (ref 0.0–1.2)
CO2: 28 mmol/L (ref 20–29)
Calcium: 9.2 mg/dL (ref 8.7–10.2)
Chloride: 101 mmol/L (ref 96–106)
Creatinine, Ser: 0.82 mg/dL (ref 0.57–1.00)
Globulin, Total: 2.1 g/dL (ref 1.5–4.5)
Glucose: 93 mg/dL (ref 65–99)
Potassium: 4.5 mmol/L (ref 3.5–5.2)
Sodium: 137 mmol/L (ref 134–144)
Total Protein: 6.5 g/dL (ref 6.0–8.5)
eGFR: 85 mL/min/{1.73_m2} (ref >59)

## 2021-05-01 LAB — LIPID PANEL WITH LDL/HDL RATIO
Cholesterol, Total: 189 mg/dL (ref 100–199)
HDL: 69 mg/dL (ref >39)
LDL Chol Calc (NIH): 103 mg/dL — ABNORMAL HIGH (ref 0–99)
LDL/HDL Ratio: 1.5 ratio (ref 0.0–3.2)
Triglycerides: 96 mg/dL (ref 0–149)
VLDL Cholesterol Cal: 17 mg/dL (ref 5–40)

## 2021-05-01 LAB — LDL CHOLESTEROL, DIRECT: LDL Direct: 98 mg/dL (ref 0–99)

## 2021-05-09 ENCOUNTER — Ambulatory Visit: Payer: BC Managed Care – PPO | Admitting: Cardiology

## 2021-05-09 ENCOUNTER — Encounter: Payer: Self-pay | Admitting: Cardiology

## 2021-05-09 ENCOUNTER — Other Ambulatory Visit: Payer: Self-pay

## 2021-05-09 VITALS — BP 115/81 | HR 87 | Temp 98.5°F | Resp 17 | Ht 63.0 in | Wt 145.0 lb

## 2021-05-09 DIAGNOSIS — Z8616 Personal history of COVID-19: Secondary | ICD-10-CM

## 2021-05-09 DIAGNOSIS — G459 Transient cerebral ischemic attack, unspecified: Secondary | ICD-10-CM

## 2021-05-09 DIAGNOSIS — E7800 Pure hypercholesterolemia, unspecified: Secondary | ICD-10-CM

## 2021-05-09 DIAGNOSIS — I700 Atherosclerosis of aorta: Secondary | ICD-10-CM

## 2021-05-09 NOTE — Progress Notes (Signed)
Date:  05/09/2021   ID:  Kathleen Alvarez, DOB [birth year], MRN [id number]  PCP:  [name], MD  Cardiologist:  Tessa Lerner, DO, [facility] (established care 12/12/2020)   Date: 05/09/21 Last Office Visit: 02/07/2021   Chief Complaint  Patient presents with   Hyperlipidemia   Hx of TIA    3 month    HPI  Kathleen Alvarez is a 55 y.o. 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  She is referred to the office at the request of [name], MD for evaluation of TIA, aortic calcification, hypercholesterolemia.  In February 2022 patient had symptoms of weakness and aphasia while working at a [employer] and presented to [facility], ED for further evaluation and management.  Patient's symptoms lasted for 1.5 minutes or less and spontaneously resolved.  She was diagnosed with transient ischemic attack and underwent neurology evaluation.  Later referred to cardiology for further evaluation and management she underwent an echocardiogram, 14-day extended Holter monitor, and we discussed stricter lipid management given the recent TIA.   Patient initially implemented lifestyle changes with minimal improvement in LDL and later was initiated on statin therapy.  Most recent lipid profile from 04/30/2021 notes significant improvement in LDL but still not at goal.  Given her risk factors and recent TIA she also underwent an exercise nuclear stress test which is overall a low risk study and details noted below for further reference.  Patient presents for follow-up today.  Patient denies any chest pain or shortness of breath at rest or with effort related activities.  Reviewed the results of the stress test,  calcium score, and the most recent lipid profile as noted above.   FUNCTIONAL STATUS: Regularly goes to the gym 3-4x week for atleast 1 - 1.5 hours.    ALLERGIES: Allergies  Allergen Reactions   Peg [Polyethylene Glycol] Itching and Nausea And Vomiting    Used when had colonoscopy prep    MEDICATION LIST PRIOR TO VISIT: Current Meds  Medication Sig   ACETYLCHOLINE CHLORIDE IO Take 1 capsule by mouth daily.   albuterol (PROVENTIL HFA;VENTOLIN HFA) 108 (90 BASE) MCG/ACT inhaler Inhale 2 puffs into the lungs every 6 (six) hours as needed. For exercise-induced bronchospasm   amphetamine-dextroamphetamine (ADDERALL) 10 MG tablet Take 10 mg by mouth as needed (alertness).   aspirin EC 81 MG tablet Take 1 tablet (81 mg total) by mouth daily. Swallow whole.   BIJUVA 1-100 MG CAPS Take 1 capsule by mouth daily.   cholecalciferol (VITAMIN D) 1000 UNITS tablet Take 5,000 Units by mouth. Monday-Friday   HUMIRA PEN 40 MG/0.4ML PNKT INJECT 1 PEN UNDER THE SKIN EVERY 14 DAYS.   ibuprofen (ADVIL) 800 MG tablet Take 1 tablet (800 mg total) by mouth every 8 (eight) hours as needed. (Patient taking differently: Take 800 mg by mouth every 8 (eight) hours as needed for headache or mild pain.)   lisdexamfetamine (VYVANSE) 70 MG capsule Take 1 capsule (70 mg total) by mouth daily.   Multiple Vitamin (MULTIVITAMIN WITH MINERALS) TABS Take 1 tablet by mouth daily.   NON FORMULARY Take 1 capsule by mouth daily. Tributyrin 350   NON FORMULARY Take 1 capsule by mouth daily. Homocysteine   NON FORMULARY Take 1 capsule by mouth daily. Essential  aminos   Omega-3 Fatty Acids (FISH OIL PO) Take 2,400 mg by mouth in the morning and at bedtime.   Probiotic Product (PROBIOTIC DAILY PO) Take 1 capsule by mouth as needed.   rosuvastatin (CRESTOR) 5 MG tablet TAKE 1 TABLET BY MOUTH EVERYDAY AT BEDTIME   venlafaxine (EFFEXOR) 37.5 MG tablet Take 37.5 mg by mouth daily.     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   RA (rheumatoid arthritis) (HCC)    TIA (transient ischemic attack)     PAST SURGICAL HISTORY: Past Surgical History:  Procedure Laterality Date   CESAREAN SECTION     x2   COLONOSCOPY  2009   family history of colon ca   DILITATION & CURRETTAGE/HYSTROSCOPY WITH NOVASURE ABLATION  08/26/2012   Procedure: DILATATION & CURETTAGE/HYSTEROSCOPY WITH NOVASURE ABLATION;  Surgeon: Lenoard Aden, MD;  Location: [facility];  Service: Gynecology;  Laterality: N/A;   lipoma      WISDOM TOOTH EXTRACTION      FAMILY HISTORY: The patient family history includes Colon cancer in her father; Gout in her brother; Liver cancer (age of onset: 47) in her father.  SOCIAL HISTORY:  The patient  reports that she has never smoked. She has never used smokeless tobacco. She reports that she does not drink alcohol and does not use drugs.  REVIEW OF SYSTEMS: Review of Systems  Constitutional: Negative for chills and fever.  HENT:  Negative for hoarse voice and nosebleeds.   Eyes:  Negative for discharge, double vision and pain.  Cardiovascular:  Negative for chest pain, claudication, dyspnea on exertion, leg swelling, near-syncope, orthopnea, palpitations, paroxysmal nocturnal dyspnea and syncope.  Respiratory:  Negative for hemoptysis and shortness of breath.   Musculoskeletal:  Negative for muscle cramps and myalgias.  Gastrointestinal:  Negative for abdominal pain, constipation, diarrhea, hematemesis, hematochezia, melena, nausea and vomiting.  Neurological:  Negative for dizziness and light-headedness.  PHYSICAL EXAM: Vitals with BMI 05/09/2021 03/25/2021 02/07/2021  Height     Weight 145 lbs 144 lbs 10 oz 148 lbs 10 oz  BMI 25.69 25.62 26.33  Systolic 115 105 147  Diastolic 81 73 67  Pulse 87 85 65    CONSTITUTIONAL: Well-developed and well-nourished. No acute distress.  SKIN: Skin is warm  and dry. No rash noted. No cyanosis. No pallor. No jaundice HEAD: Normocephalic and atraumatic.  EYES: No scleral icterus MOUTH/THROAT: Moist oral membranes.  NECK: No JVD present. No thyromegaly noted. No carotid bruits  LYMPHATIC: No visible cervical adenopathy.  CHEST Normal respiratory effort. No intercostal retractions  LUNGS: Clear to auscultation bilaterally.  No stridor. No wheezes. No rales.  CARDIOVASCULAR: Regular rate and rhythm, positive S1-S2, no murmurs rubs or gallops appreciated. ABDOMINAL: No apparent ascites.  EXTREMITIES: No peripheral edema  HEMATOLOGIC: No significant bruising NEUROLOGIC: Oriented to person, place, and time. Nonfocal. Normal muscle tone.  PSYCHIATRIC: Normal mood and affect. Normal behavior. Cooperative  RADIOLOGY: CT brain without contrast: 11/21/2020:Normal appearing brain parenchyma. No mass or hemorrhage. Mild paranasal sinus disease. Minimal arterial vascular calcification noted.  MRI brain without contrast: 11/21/2020: Normal brain MRI  CTA head and neck: 11/21/2020: 1. No intracranial large or medium vessel occlusion or correctable proximal stenosis. No aneurysm or vascular malformation. 2. Carotid arteries and vertebral arteries are normal. No bifurcation disease. 3. Patient does have some atherosclerosis the aorta.  CARDIAC DATABASE: EKG: 12/12/2020: Normal sinus  rhythm, 79 bpm, normal axis, without underlying ischemia or injury pattern.  Echocardiogram: 12/26/2020: Normal LV systolic function with EF 59%. Left ventricle cavity is normal in size. Normal global wall motion. Normal diastolic filling pattern, normal LAP. No significant valvular heart disease. No prior study for comparison.   Stress Testing: Exercise Sestamibi Stress Test 04/14/2021: Normal ECG stress. Patient exercised for 10 minutes and 8 seconds and achieved 12.1 METS on Bruce protocol.  There was no ST-T wave changes of ischemia.  Stress terminated due to target heart  rate achieved and fatigue.  Normal blood pressure response.  Rare PVCs (1). Myocardial perfusion is normal. Overall LV systolic function is normal without regional wall motion abnormalities. Stress LV EF: 63%. No previous exam available for comparison. Low risk.  Heart Catheterization: None   14 day extended Holter monitor: Dominant rhythm normal sinus rhythm. Heart rate 42 - 226bpm. Avg HR 76 bpm. No atrial fibrillation, nonsustained ventricular tachycardia, high grade AV block, pauses (3 seconds or longer). Total ventricular ectopic burden <1%.  Total supraventricular ectopic burden <1%.  Fastest episode was 5 beats in duration at an average heart rate of 211 bpm for 1.8 seconds. Patient triggered events: 2. Underlying rhythm normal sinus with rare SVE.   CAC Scoring:  02/18/2021: Coronary calcium score is 23.4 and this is at percentile 90 for patients of the same age, gender and ethnicity.   LABORATORY DATA: CBC Latest Ref Rng & Units 03/25/2021 11/21/2020 11/21/2020  WBC 3.8 - 10.8 Thousand/uL 8.9 - 8.3  Hemoglobin 11.7 - 15.5 g/dL 15.9(H) 14.6 14.7  Hematocrit 35.0 - 45.0 % 48.1(H) 43.0 44.9  Platelets 140 - 400 Thousand/uL 333 - 378    CMP Latest Ref Rng & Units 04/30/2021 03/25/2021 01/14/2021  Glucose 65 - 99 mg/dL 93 78 94  BUN 6 - 24 mg/dL 12 18 16   Creatinine 0.57 - 1.00 mg/dL 7.98 9.21  Sodium 134 - 144 mmol/L 137 141 139  Potassium 3.5 - 5.2 mmol/L 4.5 5.3 5.1  Chloride 96 - 106 mmol/L 101 103 101  CO2 20 - 29 mmol/L 28 30 27   Calcium 8.7 - 10.2 mg/dL 9.2 10.5(H) 10.0  Total Protein 6.0 - 8.5 g/dL 6.5 7.4 6.5  Total Bilirubin 0.0 - 1.2 mg/dL 0.3 0.5 0.5  Alkaline Phos 44 - 121 IU/L 64 - 77  AST 0 - 40 IU/L 32 29 35  ALT 0 - 32 IU/L 27 25 35(H)    Lipid Panel     Component Value Date/Time   CHOL 189 04/30/2021 1015   TRIG 96 04/30/2021 1015   HDL 69 04/30/2021 1015   CHOLHDL 3.7 11/27/2020 1557   LDLCALC 103 (H) 04/30/2021 1015   LDLDIRECT 98  04/30/2021 1015   LABVLDL 17 04/30/2021 1015   BMP Recent Labs    10/03/20 0000 11/21/20 1839 11/21/20 1925 01/14/21 0843 03/25/21 1450 04/30/21 1015  NA 140 140   < > 139 141 137  K 4.9 4.0   < > 5.1 5.3 4.5  CL 102 102   < > 101 103 101  CO2 29 27  --  27 30 28   GLUCOSE 94 84   < > 94 78 93  BUN 19 14   < > 16 18 12   CREATININE 0.85 0.79   < > 0.94 0.91 0.82  CALCIUM 10.2 9.5  --  10.0 10.5* 9.2  GFRNONAA 79 >60  --   --  72  --   GFRAA 91  --   --   --  83  --    < > = values in this interval not displayed.    HEMOGLOBIN A1C No results found for: HGBA1C, MPG  IMPRESSION:    ICD-10-CM   1. Hypercholesterolemia  E78.00     2. TIA (transient ischemic attack)  G45.9     3. Aortic calcification (HCC)  I70.0     4. History of COVID-19  Z86.16        RECOMMENDATIONS: Kathleen Alvarez is a 55 y.o. female whose past medical history and cardiac risk factors include: History of COVID-19 infection, narcolepsy, rheumatoid arthritis, TIA (February 2022), aortic atherosclerosis, postmenopausal female.  Hypercholesterolemia: Trajectory of her LDL levels 161, 116, 98 (most recent).  Recommend a goal LDL of 70 mg/dL given her history of TIA and coronary artery calcification She is hesitant to uptitrate statin therapy for now.  Recommend working on lifestyle changes for an additional 6 months and to repeat a fasting lipid profile with either myself or when she establishes with a new PCP. If the patient's PCP is willing to manage hyperlipidemia I will see her on an annual basis otherwise in 6 months with labs prior to the office visit.  Patient is agreeable with the plan of care and is very thankful for the work-up and guidance provided.  Aortic atherosclerosis: Continue aspirin and statin therapy.  Transient ischemic attack: No recurrence of symptoms since last visit. Continue aspirin and statin therapy.  FINAL MEDICATION LIST END OF ENCOUNTER: No orders of the defined types were  placed in this encounter.   There are no discontinued medications.   Current Outpatient Medications:    ACETYLCHOLINE CHLORIDE IO, Take 1 capsule by mouth daily., Disp: , Rfl:    albuterol (PROVENTIL HFA;VENTOLIN HFA) 108 (90 BASE) MCG/ACT inhaler, Inhale 2 puffs into the lungs every 6 (six) hours as needed. For exercise-induced bronchospasm, Disp: , Rfl:    amphetamine-dextroamphetamine (ADDERALL) 10 MG tablet, Take 10 mg by mouth as needed (alertness)., Disp: , Rfl:    aspirin EC 81 MG tablet, Take 1 tablet (81 mg total) by mouth daily. Swallow whole., Disp: 30 tablet, Rfl: 11   BIJUVA 1-100 MG CAPS, Take 1 capsule by mouth daily., Disp: , Rfl:    cholecalciferol (VITAMIN D) 1000 UNITS tablet, Take 5,000 Units by mouth. Monday-Friday, Disp: , Rfl:    HUMIRA PEN 40 MG/0.4ML PNKT, INJECT 1 PEN UNDER THE SKIN EVERY 14 DAYS., Disp: 6 each, Rfl: 0   ibuprofen (ADVIL) 800 MG tablet, Take 1 tablet (800 mg total) by mouth every 8 (eight) hours as needed. (Patient taking differently: Take 800 mg by mouth every 8 (eight) hours as needed for headache or mild pain.), Disp: 12 tablet, Rfl: 0   lisdexamfetamine (VYVANSE) 70 MG capsule, Take 1 capsule (70 mg total) by mouth daily., Disp: 90 capsule, Rfl: 0   Multiple Vitamin (MULTIVITAMIN WITH MINERALS) TABS, Take 1 tablet by mouth daily., Disp: , Rfl:    NON FORMULARY, Take 1 capsule by mouth daily. Tributyrin 350, Disp: , Rfl:    NON FORMULARY, Take 1 capsule by mouth daily. Homocysteine, Disp: , Rfl:    NON FORMULARY, Take 1 capsule by mouth daily. Essential aminos, Disp: , Rfl:    Omega-3 Fatty Acids (FISH OIL PO), Take 2,400 mg by mouth in the morning and at bedtime., Disp: , Rfl:    Probiotic Product (PROBIOTIC DAILY PO), Take 1 capsule by mouth as needed., Disp: , Rfl:    rosuvastatin (CRESTOR) 5 MG tablet, TAKE 1  TABLET BY MOUTH EVERYDAY AT BEDTIME, Disp: 30 tablet, Rfl: 0   venlafaxine (EFFEXOR) 37.5 MG tablet, Take 37.5 mg by mouth daily., Disp: ,  Rfl:   No orders of the defined types were placed in this encounter.  --Continue cardiac medications as reconciled in final medication list. --Return in about 6 months (around 11/09/2021) for Follow up, Lipid. Or sooner if needed. --Continue follow-up with your primary care physician regarding the management of your other chronic comorbid conditions.  Patient's questions and concerns were addressed to her satisfaction. She voices understanding of the instructions provided during this encounter.   This note was created using a voice recognition software as a result there may be grammatical errors inadvertently enclosed that do not reflect the nature of this encounter. Every attempt is made to correct such errors.  Tessa Lerner, [place], [facility]  Pager: [phone] Office: [phone]

## 2021-05-12 ENCOUNTER — Other Ambulatory Visit: Payer: Self-pay

## 2021-05-12 DIAGNOSIS — G459 Transient cerebral ischemic attack, unspecified: Secondary | ICD-10-CM

## 2021-05-20 ENCOUNTER — Other Ambulatory Visit: Payer: Self-pay | Admitting: Cardiology

## 2021-05-20 DIAGNOSIS — E7800 Pure hypercholesterolemia, unspecified: Secondary | ICD-10-CM

## 2021-05-20 DIAGNOSIS — G459 Transient cerebral ischemic attack, unspecified: Secondary | ICD-10-CM

## 2021-05-20 DIAGNOSIS — I700 Atherosclerosis of aorta: Secondary | ICD-10-CM

## 2021-06-03 ENCOUNTER — Other Ambulatory Visit: Payer: Self-pay | Admitting: Physician Assistant

## 2021-06-03 DIAGNOSIS — M0579 Rheumatoid arthritis with rheumatoid factor of multiple sites without organ or systems involvement: Secondary | ICD-10-CM

## 2021-06-03 NOTE — Telephone Encounter (Signed)
Next Visit: 09/03/2021  Last Visit: 03/25/2021  Last Fill: 12/17/2020  AE:SLPNPYYFRT arthritis involving multiple sites with positive rheumatoid factor   Current Dose per office note 03/25/2021: Humira 40 mg every 14 days  Labs: 04/30/2021 CMP WNL, 03/25/2021 Hemoglobin is mildly elevated not concerning.  Calcium is mildly elevated.  TB Gold: 10/03/2020 Neg   Okay to refill Humira?

## 2021-06-11 ENCOUNTER — Other Ambulatory Visit: Payer: Self-pay | Admitting: Neurology

## 2021-06-11 MED ORDER — LISDEXAMFETAMINE DIMESYLATE 70 MG PO CAPS: 70.0000 mg | ORAL_CAPSULE | Freq: Every day | ORAL | 0 refills | Status: DC

## 2021-06-11 NOTE — Telephone Encounter (Signed)
Pt called requesting refill for lisdexamfetamine (VYVANSE) 70 MG capsule. [facility]acy [facility] [phone].

## 2021-06-11 NOTE — Telephone Encounter (Signed)
I have routed this request to Dr Dohmeier for review. The pt is due for the medication and [place] registry was verified.  

## 2021-06-13 ENCOUNTER — Other Ambulatory Visit: Payer: Self-pay | Admitting: *Deleted

## 2021-06-13 ENCOUNTER — Telehealth: Payer: Self-pay | Admitting: Neurology

## 2021-06-13 DIAGNOSIS — Z79899 Other long term (current) drug therapy: Secondary | ICD-10-CM

## 2021-06-13 LAB — COMPLETE METABOLIC PANEL WITH GFR
AG Ratio: 1.8 (calc) (ref 1.0–2.5)
ALT: 29 U/L (ref 6–29)
AST: 32 U/L (ref 10–35)
Albumin: 4.3 g/dL (ref 3.6–5.1)
Alkaline phosphatase (APISO): 58 U/L (ref 37–153)
BUN: 16 mg/dL (ref 7–25)
CO2: 30 mmol/L (ref 20–32)
Calcium: 9.7 mg/dL (ref 8.6–10.4)
Chloride: 104 mmol/L (ref 98–110)
Creat: 0.93 mg/dL (ref 0.50–1.03)
Globulin: 2.4 g/dL (calc) (ref 1.9–3.7)
Glucose, Bld: 97 mg/dL (ref 65–99)
Potassium: 4.8 mmol/L (ref 3.5–5.3)
Sodium: 142 mmol/L (ref 135–146)
Total Bilirubin: 0.7 mg/dL (ref 0.2–1.2)
Total Protein: 6.7 g/dL (ref 6.1–8.1)
eGFR: 73 mL/min/{1.73_m2} (ref >=60)

## 2021-06-13 LAB — CBC WITH DIFFERENTIAL/PLATELET
Absolute Monocytes: 624 cells/uL (ref 200–950)
Basophils Absolute: 78 cells/uL (ref 0–200)
Basophils Relative: 1 %
Eosinophils Absolute: 70 cells/uL (ref 15–500)
Eosinophils Relative: 0.9 %
HCT: 43.2 % (ref 35.0–45.0)
Hemoglobin: 14.5 g/dL (ref 11.7–15.5)
Lymphs Abs: 2878 cells/uL (ref 850–3900)
MCH: 29.8 pg (ref 27.0–33.0)
MCHC: 33.6 g/dL (ref 32.0–36.0)
MCV: 88.7 fL (ref 80.0–100.0)
MPV: 10.9 fL (ref 7.5–12.5)
Monocytes Relative: 8 %
Neutro Abs: 4150 cells/uL (ref 1500–7800)
Neutrophils Relative %: 53.2 %
Platelets: 328 10*3/uL (ref 140–400)
RBC: 4.87 10*6/uL (ref 3.80–5.10)
RDW: 11.8 % (ref 11.0–15.0)
Total Lymphocyte: 36.9 %
WBC: 7.8 10*3/uL (ref 3.8–10.8)

## 2021-06-13 NOTE — Telephone Encounter (Signed)
Pt called, was told by the pharmacy refill for lisdexamfetamine (VYVANSE) 70 MG capsuleis on hold by physician until Sunday. Pt is out of medication. Would like a call from the on call physician.

## 2021-06-14 NOTE — Progress Notes (Signed)
CBC and CMP are normal.

## 2021-06-22 ENCOUNTER — Other Ambulatory Visit: Payer: Self-pay | Admitting: Cardiology

## 2021-06-22 DIAGNOSIS — G459 Transient cerebral ischemic attack, unspecified: Secondary | ICD-10-CM

## 2021-06-22 DIAGNOSIS — I700 Atherosclerosis of aorta: Secondary | ICD-10-CM

## 2021-06-22 DIAGNOSIS — E7800 Pure hypercholesterolemia, unspecified: Secondary | ICD-10-CM

## 2021-06-24 ENCOUNTER — Telehealth: Payer: Self-pay

## 2021-06-24 NOTE — Telephone Encounter (Signed)
Received faxed renewal form from [facility] for HUMIRA. Completed and faxed back, will await determination.  Fax: [phone] Phone: [phone]

## 2021-06-25 NOTE — Telephone Encounter (Signed)
Received notification from [facility] regarding a prior authorization for HUMIRA. Authorization has been APPROVED from 06/24/21 to 06/24/22.   Patient must continue to fill through [facility]: [phone]  Authorization # [id number]  Chesley Mires, PharmD, MPH, BCPS Clinical Pharmacist (Rheumatology and Pulmonology)

## 2021-07-21 ENCOUNTER — Other Ambulatory Visit: Payer: Self-pay | Admitting: Cardiology

## 2021-07-21 DIAGNOSIS — I700 Atherosclerosis of aorta: Secondary | ICD-10-CM

## 2021-07-21 DIAGNOSIS — G459 Transient cerebral ischemic attack, unspecified: Secondary | ICD-10-CM

## 2021-07-21 DIAGNOSIS — E7800 Pure hypercholesterolemia, unspecified: Secondary | ICD-10-CM

## 2021-08-15 ENCOUNTER — Other Ambulatory Visit: Payer: Self-pay | Admitting: Cardiology

## 2021-08-15 DIAGNOSIS — E7800 Pure hypercholesterolemia, unspecified: Secondary | ICD-10-CM

## 2021-08-15 DIAGNOSIS — G459 Transient cerebral ischemic attack, unspecified: Secondary | ICD-10-CM

## 2021-08-15 DIAGNOSIS — I700 Atherosclerosis of aorta: Secondary | ICD-10-CM

## 2021-08-20 NOTE — Progress Notes (Signed)
Office Visit Note  Patient: Kathleen Alvarez             Date of Birth: 05/08/1966           MRN: [id number]             PCP: [name], MD Referring: [name], MD Visit Date: 09/03/2021 Occupation: @GUAROCC @  Subjective:  Medication management.   History of Present Illness: Kathleen Alvarez is a 55 y.o. female with a history of rheumatoid arthritis.  She states she has been doing well as regards to her rheumatoid arthritis.  She has been tolerating Humira well.  She states for the last 4 months she has been having rash which moves around in different areas.  She states it looks like a pimple and then it goes away.  She has been scratching off and on.  She stated that she only has 1 lesion on her right thigh now.  Activities of Daily Living:  Patient reports morning stiffness for 0 minutes.   Patient Denies nocturnal pain.  Difficulty dressing/grooming: Denies Difficulty climbing stairs: Denies Difficulty getting out of chair: Denies Difficulty using hands for taps, buttons, cutlery, and/or writing: Denies  Review of Systems  Constitutional:  Negative for fatigue, night sweats, weight gain and weight loss.  HENT:  Negative for mouth sores, trouble swallowing, trouble swallowing, mouth dryness and nose dryness.   Eyes:  Negative for pain, redness, itching, visual disturbance and dryness.  Respiratory:  Negative for cough, shortness of breath and difficulty breathing.   Cardiovascular:  Negative for chest pain, palpitations, hypertension, irregular heartbeat and swelling in legs/feet.  Gastrointestinal:  Negative for blood in stool, constipation and diarrhea.  Endocrine: Negative for increased urination.  Genitourinary:  Negative for difficulty urinating and vaginal dryness.  Musculoskeletal:  Negative for joint pain, joint pain, joint swelling, myalgias, muscle weakness, morning stiffness, muscle tenderness and myalgias.  Skin:  Positive for rash. Negative for color change,  hair loss, skin tightness, ulcers and sensitivity to sunlight.  Allergic/Immunologic: Negative for susceptible to infections.  Neurological:  Negative for dizziness, numbness, headaches, memory loss, night sweats and weakness.  Hematological:  Negative for bruising/bleeding tendency and swollen glands.  Psychiatric/Behavioral:  Negative for depressed mood, confusion and sleep disturbance. The patient is not nervous/anxious.    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    Past Medical History:  Diagnosis Date   Anxiety    Hyperlipidemia    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   RA (rheumatoid arthritis) (HCC)    TIA (transient ischemic attack)     Family History  Problem Relation Age of Onset   Colon cancer Father        deceased   Liver cancer Father 95       deceased    Gout Brother    Past Surgical History:  Procedure Laterality Date   CESAREAN SECTION     x2   COLONOSCOPY  2009   family history of colon ca   DILITATION & CURRETTAGE/HYSTROSCOPY WITH NOVASURE ABLATION  08/26/2012  Procedure: DILATATION & CURETTAGE/HYSTEROSCOPY WITH NOVASURE ABLATION;  Surgeon: Lovenia Kim, MD;  Location: [facility];  Service: Gynecology;  Laterality: N/A;   lipoma      WISDOM TOOTH EXTRACTION     Social History   Social History Narrative   Patient is employed.    Patient married with 2 children.      Immunization History  Administered Date(s)  Administered   Influenza, Seasonal, Injecte, Preservative Fre 08/31/2014   Influenza,inj,Quad PF,6+ Mos 08/22/2019, 09/19/2020   Moderna Sars-Covid-2 Vaccination 12/14/2019, 01/16/2020, 09/23/2020     Objective: Vital Signs: BP 98/66 (BP Location: Left Arm, Patient Position: Sitting, Cuff Size: Normal)   Pulse 77   Ht 5\' 3"  (1.6 m)   Wt 141 lb (64 kg)   BMI 24.98 kg/m    Physical Exam Vitals and nursing note reviewed.  Constitutional:      Appearance: She is well-developed.  HENT:     Head: Normocephalic and atraumatic.  Eyes:     Conjunctiva/sclera: Conjunctivae normal.  Cardiovascular:     Rate and Rhythm: Normal rate and regular rhythm.     Heart sounds: Normal heart sounds.  Pulmonary:     Effort: Pulmonary effort is normal.     Breath sounds: Normal breath sounds.  Abdominal:     General: Bowel sounds are normal.     Palpations: Abdomen is soft.  Musculoskeletal:     Cervical back: Normal range of motion.  Lymphadenopathy:     Cervical: No cervical adenopathy.  Skin:    General: Skin is warm and dry.     Capillary Refill: Capillary refill takes less than 2 seconds.     Comments: A 2 mm papule was noted on her right thigh with central crusting.  Neurological:     Mental Status: She is alert and oriented to person, place, and time.  Psychiatric:        Behavior: Behavior normal.     Musculoskeletal Exam: C-spine thoracic and lumbar spine with good range of motion.  Shoulder joints, elbow joints, wrist joints, MCPs PIPs and DIPs with good range of motion with no synovitis.  Hip joints, knee joints, ankles, MTPs and PIPs with good range of motion with no synovitis.  CDAI Exam: CDAI Score: 0  Patient Global: 0 mm; Provider Global: 0 mm Swollen: 0 ; Tender: 0  Joint Exam 09/03/2021   No joint exam has been documented for this visit   There is currently no information documented on the homunculus. Go to the Rheumatology activity and complete the homunculus joint  exam.  Investigation: No additional findings.  Imaging: No results found.  Recent Labs: Lab Results  Component Value Date   WBC 7.8 06/13/2021   HGB 14.5 06/13/2021   PLT 328 06/13/2021   NA 142 06/13/2021   K 4.8 06/13/2021   CL 104 06/13/2021   CO2 30 06/13/2021   GLUCOSE 97 06/13/2021   BUN 16 06/13/2021   CREATININE 0.93 06/13/2021   BILITOT 0.7 06/13/2021   ALKPHOS 64 04/30/2021   AST 32 06/13/2021   ALT 29 06/13/2021   PROT 6.7 06/13/2021   ALBUMIN 4.4 04/30/2021   CALCIUM 9.7 06/13/2021   GFRAA 83 03/25/2021   QFTBGOLDPLUS NEGATIVE 10/03/2020    Speciality Comments: No specialty comments available.  Procedures:  No procedures performed Allergies: Peg [polyethylene glycol]   Assessment / Plan:     Visit Diagnoses: Rheumatoid arthritis involving multiple sites with positive rheumatoid factor (HCC) - +RF, +CCP.  Patient  had no synovitis on my examination.  All joints were in full range of motion.  High risk medication use - Humira 40 mg every 14 days.  -Last labs from June 13, 2021 were reviewed which were within normal limits.  Will obtain labs today.  Labs will be monitored every 3 months to monitor for drug toxicity.  Last TB gold was on October 03, 2020 which was negative.  Plan: CBC with Differential/Platelet, COMPLETE METABOLIC PANEL WITH GFR, QuantiFERON-TB Gold Plus.  Updated information regarding immunization was placed in the AVS.  She has been advised to get annual skin examination to screen for skin cancer while she is on Humira.  She is also advised to hold Humira in case she develops an infection and resume after the infection resolves.  Rash-she showed some pictures on her phone which appear as solitary papulovesicular lesions.  She also had a rash on her right thigh a single 2 mm lesion with central crusting due to excoriation.  I advised Dial soap and use of topical antibiotic cream.  If her rash persists she should see a dermatologist.  Other  medical problems are listed as follows:  TIA (transient ischemic attack) - February 2022.  She was placed on Crestor.  Dyslipidemia  Other fatigue - due to narcolepsy.  Narcolepsy due to underlying condition without cataplexy  Postmenopausal - her bone density done in August 2019 was within normal limits.  Attention deficit hyperactivity disorder (ADHD), predominantly inattentive type  Orders: Orders Placed This Encounter  Procedures   CBC with Differential/Platelet   COMPLETE METABOLIC PANEL WITH GFR   QuantiFERON-TB Gold Plus    No orders of the defined types were placed in this encounter.    Follow-Up Instructions: Return in about 5 months (around 02/01/2022) for Rheumatoid arthritis.   Bo Merino, MD  Note - This record has been created using Editor, commissioning.  Chart creation errors have been sought, but may not always  have been located. Such creation errors do not reflect on  the standard of medical care.

## 2021-09-03 ENCOUNTER — Encounter: Payer: Self-pay | Admitting: Rheumatology

## 2021-09-03 ENCOUNTER — Other Ambulatory Visit: Payer: Self-pay

## 2021-09-03 ENCOUNTER — Ambulatory Visit: Payer: BC Managed Care – PPO | Admitting: Rheumatology

## 2021-09-03 VITALS — BP 98/66 | HR 77 | Ht 63.0 in | Wt 141.0 lb

## 2021-09-03 DIAGNOSIS — G47429 Narcolepsy in conditions classified elsewhere without cataplexy: Secondary | ICD-10-CM

## 2021-09-03 DIAGNOSIS — Z780 Asymptomatic menopausal state: Secondary | ICD-10-CM

## 2021-09-03 DIAGNOSIS — R5383 Other fatigue: Secondary | ICD-10-CM

## 2021-09-03 DIAGNOSIS — R21 Rash and other nonspecific skin eruption: Secondary | ICD-10-CM

## 2021-09-03 DIAGNOSIS — M0579 Rheumatoid arthritis with rheumatoid factor of multiple sites without organ or systems involvement: Secondary | ICD-10-CM | POA: Diagnosis not present

## 2021-09-03 DIAGNOSIS — G459 Transient cerebral ischemic attack, unspecified: Secondary | ICD-10-CM

## 2021-09-03 DIAGNOSIS — F900 Attention-deficit hyperactivity disorder, predominantly inattentive type: Secondary | ICD-10-CM

## 2021-09-03 DIAGNOSIS — Z79899 Other long term (current) drug therapy: Secondary | ICD-10-CM | POA: Diagnosis not present

## 2021-09-03 DIAGNOSIS — E785 Hyperlipidemia, unspecified: Secondary | ICD-10-CM

## 2021-09-03 NOTE — Patient Instructions (Addendum)
Standing Labs We placed an order today for your standing lab work.   Please have your standing labs drawn in March and every 3 months  If possible, please have your labs drawn 2 weeks prior to your appointment so that the provider can discuss your results at your appointment.  Please note that you may see your imaging and lab results in MyChart before we have reviewed them. We may be awaiting multiple results to interpret others before contacting you. Please allow our office up to 72 hours to thoroughly review all of the results before contacting the office for clarification of your results.  We have open lab daily: Monday through Thursday from 1:30-4:30 PM and Friday from 1:30-4:00 PM at the office of Dr. Pollyann Savoy, [facility].   Please be advised, all patients with office appointments requiring lab work will take precedent over walk-in lab work.  If possible, please come for your lab work on Monday and Friday afternoons, as you may experience shorter wait times. The office is located at [address], [place], [place] [address] No appointment is necessary.   Labs are drawn by [facility]. Please bring your co-pay at the time of your lab draw.  You may receive a bill from [facility] for your lab work.  If you wish to have your labs drawn at another location, please call the office 24 hours in advance to send orders.  If you have any questions regarding directions or hours of operation,  please call [phone].   As a reminder, please drink plenty of water prior to coming for your lab work. Thanks!   Vaccines You are taking a medication(s) that can suppress your immune system.  The following immunizations are recommended: Flu annually Covid-19  Td/Tdap (tetanus, diphtheria, pertussis) every 10 years Pneumonia (Prevnar 15 then Pneumovax 23 at least 1 year apart.  Alternatively, can take Prevnar 20 without needing additional dose) Shingrix: 2 doses from 4 weeks  to 6 months apart  Please check with your PCP to make sure you are up to date.  If you have signs or symptoms of an infection or start antibiotics: First, call your PCP for workup of your infection. Hold your medication through the infection, until you complete your antibiotics, and until symptoms resolve if you take the following: Injectable medication (Actemra, Benlysta, Cimzia, Cosentyx, Enbrel, Humira, Kevzara, Orencia, Remicade, Simponi, Stelara, Taltz, Tremfya) Methotrexate Leflunomide (Arava) Mycophenolate (Cellcept) Harriette Ohara, Olumiant, or Rinvoq  Recommend annual skin examination to screen for skin cancer while you are on Humira.

## 2021-09-06 LAB — COMPLETE METABOLIC PANEL WITH GFR
AG Ratio: 1.6 (calc) (ref 1.0–2.5)
ALT: 29 U/L (ref 6–29)
AST: 31 U/L (ref 10–35)
Albumin: 4 g/dL (ref 3.6–5.1)
Alkaline phosphatase (APISO): 54 U/L (ref 37–153)
BUN: 18 mg/dL (ref 7–25)
CO2: 30 mmol/L (ref 20–32)
Calcium: 9.8 mg/dL (ref 8.6–10.4)
Chloride: 105 mmol/L (ref 98–110)
Creat: 0.74 mg/dL (ref 0.50–1.03)
Globulin: 2.5 g/dL (calc) (ref 1.9–3.7)
Glucose, Bld: 82 mg/dL (ref 65–99)
Potassium: 5 mmol/L (ref 3.5–5.3)
Sodium: 143 mmol/L (ref 135–146)
Total Bilirubin: 0.5 mg/dL (ref 0.2–1.2)
Total Protein: 6.5 g/dL (ref 6.1–8.1)
eGFR: 97 mL/min/{1.73_m2} (ref >=60)

## 2021-09-06 LAB — CBC WITH DIFFERENTIAL/PLATELET
Absolute Monocytes: 836 cells/uL (ref 200–950)
Basophils Absolute: 84 cells/uL (ref 0–200)
Basophils Relative: 1.1 %
Eosinophils Absolute: 91 cells/uL (ref 15–500)
Eosinophils Relative: 1.2 %
HCT: 43.2 % (ref 35.0–45.0)
Hemoglobin: 14.6 g/dL (ref 11.7–15.5)
Lymphs Abs: 2576 cells/uL (ref 850–3900)
MCH: 29.9 pg (ref 27.0–33.0)
MCHC: 33.8 g/dL (ref 32.0–36.0)
MCV: 88.3 fL (ref 80.0–100.0)
MPV: 11.1 fL (ref 7.5–12.5)
Monocytes Relative: 11 %
Neutro Abs: 4013 cells/uL (ref 1500–7800)
Neutrophils Relative %: 52.8 %
Platelets: 335 10*3/uL (ref 140–400)
RBC: 4.89 10*6/uL (ref 3.80–5.10)
RDW: 11.4 % (ref 11.0–15.0)
Total Lymphocyte: 33.9 %
WBC: 7.6 10*3/uL (ref 3.8–10.8)

## 2021-09-06 LAB — QUANTIFERON-TB GOLD PLUS
Mitogen-NIL: >10 IU/mL
NIL: 0.03 IU/mL
QuantiFERON-TB Gold Plus: NEGATIVE
TB1-NIL: 0 IU/mL
TB2-NIL: 0 IU/mL

## 2021-09-07 NOTE — Progress Notes (Signed)
CBC and CMP are normal.  TB Gold is negative.

## 2021-09-09 ENCOUNTER — Other Ambulatory Visit: Payer: Self-pay | Admitting: Neurology

## 2021-09-09 MED ORDER — LISDEXAMFETAMINE DIMESYLATE 70 MG PO CAPS: 70.0000 mg | ORAL_CAPSULE | Freq: Every day | ORAL | 0 refills | Status: DC

## 2021-09-09 NOTE — Telephone Encounter (Signed)
I have routed this request to Dr Dohmeier for review. The pt is due for the medication and [place] registry was verified.  

## 2021-09-09 NOTE — Telephone Encounter (Signed)
Pt requesting a refill for lisdexamfetamine (VYVANSE) 70 MG capsule. Pharmacy [facility] [phone] - [place], [place] - [address]

## 2021-09-14 ENCOUNTER — Other Ambulatory Visit: Payer: Self-pay | Admitting: Cardiology

## 2021-09-14 DIAGNOSIS — G459 Transient cerebral ischemic attack, unspecified: Secondary | ICD-10-CM

## 2021-09-14 DIAGNOSIS — E7800 Pure hypercholesterolemia, unspecified: Secondary | ICD-10-CM

## 2021-09-14 DIAGNOSIS — I700 Atherosclerosis of aorta: Secondary | ICD-10-CM

## 2021-09-16 ENCOUNTER — Other Ambulatory Visit: Payer: Self-pay

## 2021-09-16 ENCOUNTER — Encounter: Payer: Self-pay | Admitting: Neurology

## 2021-09-16 ENCOUNTER — Ambulatory Visit: Payer: BC Managed Care – PPO | Admitting: Neurology

## 2021-09-16 VITALS — BP 118/76 | HR 77 | Ht 63.0 in | Wt 140.0 lb

## 2021-09-16 DIAGNOSIS — G47419 Narcolepsy without cataplexy: Secondary | ICD-10-CM | POA: Diagnosis not present

## 2021-09-16 DIAGNOSIS — Z8673 Personal history of transient ischemic attack (TIA), and cerebral infarction without residual deficits: Secondary | ICD-10-CM

## 2021-09-16 DIAGNOSIS — I700 Atherosclerosis of aorta: Secondary | ICD-10-CM

## 2021-09-16 DIAGNOSIS — E7801 Familial hypercholesterolemia: Secondary | ICD-10-CM | POA: Diagnosis not present

## 2021-09-16 NOTE — Progress Notes (Signed)
PATIENT: Kathleen Alvarez DOB: [birth year]  REASON FOR VISIT: follow up/ narcolepsy patient with EDS, sleep disturbance menopausal, sleep benefiting HRT may have let to a TIA>  HISTORY FROM: patient, here alone.  HISTORY OF PRESENT ILLNESS:  09/16/21:RV with Kathleen Alvarez , a 55 year-old [place] -[place] female : Interval history , patient had COVID 19 in January 2022 , was mildly symptomatic, mostly GI, nauseated since.  She was on hormone replacement therapy, when she lost 8 pounds ( and happy about that) and started to sleep better and now experienced a TIA. ; I believe this truly was a TIA, as she experienced speech arrest , her right hand was "floppy"- uncontrolled and she felt numb- and she couldn't count-  all this started during a work out- total time about 5 minute episode. She also added that the coach present at the time of her workout noticed that her right face was droopy.   Not a seizure presentation- HRT had been immediately discontinued. She now reports being back on HRT, and sleeping good again. We discussed that she needs to be on antiplatelet therapy and while I am comfortable with very other day aspirin, her cardiologist asked her to take it daily. She also was placed on cholesterol medication, which she tolerated well. Reviewed her labs. No clotting abnormalities, she reported normal results of cardiac monitoring. New PCP is with [facility], labs not accessible. Marland Kitchen   11-27-2020 Patient presents with possible - probable TIA- She was seen at the [facility] emergency room on 21 November 2020 within an hour of her experience.  A head CT and an MRI of the brain were both obtained- Showing  aortic atherosclerotic changes,  but widely patent carotid arteries . Radiologist noted she has a little bit of cervical spondylosis, her CTA showed all large and medium sized vessels to be flow-void ( patent).  Carotid arteries and vertebral arteries also were normal.  There was some atherosclerotic noted in  the aorta  that was the only significant finding.  As to the work-up in the emergency room the episode was deemed not having lasted longer in total as 1 minute , the patient today thinks that it may have been within 5 minutes that she became aware of 3 different symptoms.  She also added that the coach present at the time of her workout noticed that her right face was droopy.  she had speech arrest, she had control loss of her right arm, she was unable to count and felt somewhat confused.   Therefore I agree with the discontinuation of the hormone replacement therapy at this time but we are going to do is to add a cardiac monitor and I like for her to have a complete blood clotting lab evaluation.   History of encounters:   HISTORY 2016 :Kathleen Alvarez is a 55 y.o. female and  seen here as a revisit for medication refill in the treatment of narcolepsy. 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  She had symptoms of this condition since her early college years and was first seen in this practice in 2007. For the treatment of for hypersomnia she was started on Provigil but within 6 months she had noted some irritability and anger on that medication, this in addition was more complicated as the patient became pregnant.  Provigil was therefore discontinued and she was started on Adderall ,  Tolerated much better for about a year she was eventually changed to Vyvanse in 2008 . In April 2016 she was diagnosed with rheumatoid arthritis, takes MTX. this condition deepens her fatigue further.  She continues to do well on Vyvanse after over 7 years. She still sleeps 10-12 hours over a 24 hour period. She is not snoring. No apnea.    Interval history from 09/15/2016. I have the pleasure of seeing Dr. [name], PhD, for routine revisit. The patient introduced to tolerate Vyvanse which she will have taken  for 10 years next year. Refills today. No evidence of hypertension, hypersomnia, hyperglycemia.   Interval history from 20 September 2017, I have the pleasure of seeing Dr. 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  Her degree as an exercise physiology. She endorsed the Epworth sleepiness scale today still at 17 points without medicine and under the influence of Vyvanse around 11 points. We continue on Vyvanse. She has been on the medication now for 10 years. She has been sleepy all her adult life, since college, perhaps late years of high school.   2019 "MM  Kathleen Alvarez is a 55 year old female with a history of narcolepsy.  She returns today for follow-up.  She is currently on Vyvanse.  She reports that she tolerates this well.  This continues to work well for her.  She denies falling asleep at work or while driving.  Denies any heart palpitations or depression.  She states that she does have an Adderall prescription but she only uses this if she has an episode where she cannot stay awake.  Reports that she only uses this approximately 5 times a year.  She denies any cataplectic events.  She returns today for evaluation.  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  She reports ongoing effect from Vyvanse, keeping her alert and safe and productive. She is now in perimenopause.  She is going through insulin resistance - her rheumatologist follows her for rheumatoid arthritis.  She has developed abdominal fat, and is frustrated about hat. She began craving carbs.  We are dicussing sleep aids at [facility]. Trazodone stands out for a patient with dry eyes and dry mouth, as it doesn't have these side effect.  During the pandemic her teaching has been on hold.   RV :  10-09-2020. Interval history for this long -time established 55 year-old patient of mine with hypersomnia,seen here as a revisit for medication refill in the treatment of narcolepsy. She attributes weight gain to menopause.  She is reporting a lot of fatigue. Her job has changed a lot with the pandemic. 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     REVIEW OF SYSTEMS: Out of a complete 14 system review of symptoms, the patient complains only of the following symptoms, and all other reviewed systems are negative. How likely are you to doze in the following situations: 0 = not likely, 1 = slight chance, 2 = moderate chance, 3 = high chance  Sitting and Reading?3 Watching Television? 2 Sitting inactive in a public place (theater or meeting)? 2 Lying down in the afternoon when circumstances permit?33 Sitting and talking to someone? 1 Sitting quietly after lunch without alcohol?2 In a car, while stopped for  a few minutes in traffic?2 As a passenger in a car for an hour without a break?3  Total = 12/ 24 (!)  FSS at 40/63  Insomnia with hot flushes.     ALLERGIES: Allergies  Allergen Reactions   Peg [Polyethylene Glycol] Itching and Nausea And Vomiting    Used when had colonoscopy prep    HOME MEDICATIONS: Outpatient Medications Prior to Visit  Medication Sig Dispense Refill   ACETYLCHOLINE CHLORIDE IO Take 1 capsule by mouth daily.     albuterol (PROVENTIL HFA;VENTOLIN HFA) 108 (90 BASE) MCG/ACT inhaler Inhale 2 puffs into the lungs every 6 (six) hours as needed. For exercise-induced bronchospasm     amphetamine-dextroamphetamine (ADDERALL) 10 MG tablet Take 10 mg by mouth as needed (alertness).     aspirin EC 81 MG tablet Take 1 tablet (81 mg total) by mouth daily. Swallow whole. 30 tablet 11   BIJUVA 1-100 MG CAPS Take 1 capsule by mouth daily.     cholecalciferol (VITAMIN D) 1000 UNITS tablet Take 5,000 Units by mouth. Monday-Friday      HUMIRA PEN 40 MG/0.4ML PNKT INJECT 1 PEN UNDER THE SKIN EVERY 14 DAYS. 6 each 0   ibuprofen (ADVIL) 800 MG tablet Take 1 tablet (800 mg total) by mouth every 8 (eight) hours as needed. (Patient taking differently: Take 800 mg by mouth every 8 (eight) hours as needed for headache or mild pain.) 12 tablet 0   lisdexamfetamine (VYVANSE) 70 MG capsule Take 1 capsule (70 mg total) by mouth daily. 90 capsule 0   Multiple Vitamin (MULTIVITAMIN WITH MINERALS) TABS Take 1 tablet by mouth daily.     NON FORMULARY Take 1 capsule by mouth daily. Tributyrin 350     NON FORMULARY Take 1 capsule by mouth daily. Homocysteine     NON FORMULARY Take 1 capsule by mouth daily. Essential aminos     Omega-3 Fatty Acids (FISH OIL PO) Take 2,400 mg by mouth in the morning and at bedtime.     Probiotic Product (PROBIOTIC DAILY PO) Take 1 capsule by mouth as needed. (Patient not taking: Reported on 09/03/2021)     rosuvastatin (CRESTOR) 5 MG tablet TAKE 1 TABLET BY MOUTH EVERYDAY AT BEDTIME 30 tablet 0   venlafaxine (EFFEXOR) 37.5 MG tablet Take 37.5 mg by mouth daily.     No facility-administered medications prior to visit.    PAST MEDICAL HISTORY: Past Medical History:  Diagnosis Date   Anxiety    Hyperlipidemia    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   RA (rheumatoid arthritis) (HCC)    TIA (transient ischemic attack)     PAST SURGICAL HISTORY: Past Surgical History:  Procedure Laterality Date   CESAREAN SECTION     x2   COLONOSCOPY  2009   family history of colon ca   DILITATION & CURRETTAGE/HYSTROSCOPY WITH NOVASURE ABLATION  08/26/2012   Procedure: DILATATION & CURETTAGE/HYSTEROSCOPY WITH NOVASURE ABLATION;  Surgeon: Lenoard Aden, MD;  Location: [facility];  Service: Gynecology;  Laterality: N/A;   lipoma      WISDOM TOOTH EXTRACTION      FAMILY HISTORY: Family History  Problem Relation Age of Onset   Colon cancer Father        deceased   Liver cancer Father 35       deceased     Gout Brother     SOCIAL HISTORY: Social History   Socioeconomic History   Marital status: Married    Spouse name: [name]    Number  of children: 2   Years of education: 12+   Highest education level: Not on file  Occupational History   Occupation: Photographer: [employer]  Tobacco Use   Smoking status: Never   Smokeless tobacco: Never  Vaping Use   Vaping Use: Never used  Substance and Sexual Activity   Alcohol use: No   Drug use: No   Sexual activity: Not on file  Other Topics Concern   Not on file  Social History Narrative   Patient is employed.    Patient married with 2 children.      Social Determinants of Health   Financial Resource Strain: Not on file  Food Insecurity: Not on file  Transportation Needs: Not on file  Physical Activity: Not on file  Stress: Not on file  Social Connections: Not on file  Intimate Partner Violence: Not on file      PHYSICAL EXAM  Vitals:   09/16/21 0947  BP: 118/76  Pulse: 77  Weight: 140 lb (63.5 kg)  Height: 5\' 3"  (1.6 m)   Body mass index is 24.8 kg/m.  Generalized: Well developed, in no acute distress   Skin-  Dry and warm. No edema, no rash.  Posture- erect. BMI 27. Regular heart rate, no murmur, LCTA.   Neurological examination  Mentation:  No loss of taste or smell- Alert oriented to time, place, history taking.  Follows all commands her speech is clear and fluent Cranial nerves :  Extraocular movements were full, visual field were full on confrontational test. Facial sensation and strength were normal. Head turning and shoulder shrug were normal and symmetric. Motor:  full strength of all 4 extremities. No cog-wheeling, no spatsicity-   Good grip strength,  Hands have been stiff through rheumatoid arthritis. Finger flexion and extension can be painful- Good symmetric motor tone is noted throughout.  Sensory:  intact to soft touch  and vibration,on all 4 extremities. No evidence of  extinction is noted.  Coordination: finger to nose intact. No tremor, no dysmetria.  No changes in penmanship  Gait and station:  She demonstrated good balance, turns with 3 steps, no limp. Straight posture. We did not check ROMBERG or tandem gait.  DTR symmetric, intact, no clonus.    DIAGNOSTIC DATA (LABS, IMAGING, TESTING) - I reviewed patient records, labs, notes, testing and imaging myself where available.  Vaccine documentation - Covid 19:  full vaccinated with 2 doses with Moderna , now boostered times 2.    Lab Results  Component Value Date   WBC 7.6 09/03/2021   HGB 14.6 09/03/2021   HCT 43.2 09/03/2021   MCV 88.3 09/03/2021   PLT 335 09/03/2021      Component Value Date/Time   NA 143 09/03/2021 1436   NA 137 04/30/2021 1015   K 5.0 09/03/2021 1436   CL 105 09/03/2021 1436   CO2 30 09/03/2021 1436   GLUCOSE 82 09/03/2021 1436   BUN 18 09/03/2021 1436   BUN 12 04/30/2021 1015   CREATININE 0.74 09/03/2021 1436   CALCIUM 9.8 09/03/2021 1436   PROT 6.5 09/03/2021 1436   PROT 6.5 04/30/2021 1015   ALBUMIN 4.4 04/30/2021 1015   AST 31 09/03/2021 1436   ALT 29 09/03/2021 1436   ALKPHOS 64 04/30/2021 1015   BILITOT 0.5 09/03/2021 1436   BILITOT 0.3 04/30/2021 1015   GFRNONAA 72 03/25/2021 1450   GFRAA 83 03/25/2021 1450     ASSESSMENT AND PLAN 55  y.o. year old female  has a past medical history of Anxiety, Hyperlipidemia, 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.00) (09/19/2014), RA (rheumatoid arthritis) (HCC), and TIA (transient ischemic attack). here with:  1. TIA one time (  and 5 minute duration, left brain- with speech arrest , inability to count, inability to control the right arm.  Facial droop - she is not sure - was described as on the right, lasting 5 minutes).  On aspirin and rovastatin- genetically high cholesterol, lowered from 165 to 103 now. Calcium score test was high ( 23) , CT of the heart- stress test was good,  She had no bruising or gum bleeds, no  epistaxis. No loop recorder.yet . Her osteopathic - chiropractor wants her to have a methylation score - I can't find anything in [facility]'s menu that would match this.     2.Hormonal changes, perimenopausal insulin resistance and sleep disturbance- she did not like trazodone.  She is interested in  HRT , will discuss with Dr. Billy Coast. Now that she did well with weight and sleep, she unfortunaly had a TIA  and she was told that she needs to d/c the therapy.  She wants to stay on it - she feels better than ever on HRT and will not stop. Dr Billy Coast prescribes.   3.  I still cannot rule out a post COVID contribution/ post viral illness fatigue.    She voiced understanding. I refilled her VYVANSE. She will follow-up in person in12 month for refills. She just got established with a new PCP.  We can arrange for the 6 months interval RV virtually, can be done with NP.  I spent 23 minutes with the patient face to face. 50% of this time was spent discussing her medication.  Melvyn Novas, MD   09/16/2021, 10:18 AM [facility] [address], [address] [place], [place] [address] [phone]

## 2021-09-19 DIAGNOSIS — Z8673 Personal history of transient ischemic attack (TIA), and cerebral infarction without residual deficits: Secondary | ICD-10-CM | POA: Insufficient documentation

## 2021-10-14 ENCOUNTER — Other Ambulatory Visit: Payer: Self-pay | Admitting: Cardiology

## 2021-10-14 DIAGNOSIS — I700 Atherosclerosis of aorta: Secondary | ICD-10-CM

## 2021-10-14 DIAGNOSIS — G459 Transient cerebral ischemic attack, unspecified: Secondary | ICD-10-CM

## 2021-10-14 DIAGNOSIS — E7800 Pure hypercholesterolemia, unspecified: Secondary | ICD-10-CM

## 2021-10-31 ENCOUNTER — Encounter: Payer: Self-pay | Admitting: Cardiology

## 2021-11-05 ENCOUNTER — Telehealth: Payer: Self-pay

## 2021-11-05 NOTE — Telephone Encounter (Signed)
FYI: Pt called to cx appt stating not needed unless there is an issue with her labs

## 2021-11-07 NOTE — Telephone Encounter (Signed)
That's fine.  I will see her on as need basis.  Wish her the best.   Dr. Odis Hollingshead

## 2021-11-10 ENCOUNTER — Ambulatory Visit: Payer: BC Managed Care – PPO | Admitting: Cardiology

## 2021-11-14 ENCOUNTER — Other Ambulatory Visit: Payer: Self-pay | Admitting: Cardiology

## 2021-11-14 DIAGNOSIS — I700 Atherosclerosis of aorta: Secondary | ICD-10-CM

## 2021-11-14 DIAGNOSIS — G459 Transient cerebral ischemic attack, unspecified: Secondary | ICD-10-CM

## 2021-11-14 DIAGNOSIS — E7800 Pure hypercholesterolemia, unspecified: Secondary | ICD-10-CM

## 2021-11-16 NOTE — Progress Notes (Signed)
External Labs: Collected: 10/28/2021 Total cholesterol 201, triglycerides 97, HDL 87, LDL 97, non-HDL 114

## 2021-11-25 ENCOUNTER — Other Ambulatory Visit: Payer: Self-pay | Admitting: Physician Assistant

## 2021-11-25 DIAGNOSIS — M0579 Rheumatoid arthritis with rheumatoid factor of multiple sites without organ or systems involvement: Secondary | ICD-10-CM

## 2021-11-25 NOTE — Telephone Encounter (Signed)
Next Visit: 02/03/2022  Last Visit: 09/03/2021  Last Fill: 06/03/2021  EK:CMKLKJZPHX arthritis involving multiple sites with positive rheumatoid factor   Current Dose per office note 09/03/2021: Humira 40 mg every 14 days  Labs: 09/03/2021 CBC and CMP are normal.  TB Gold: 09/03/2021 Neg    Okay to refill Humira?

## 2021-11-26 ENCOUNTER — Encounter: Payer: Self-pay | Admitting: Neurology

## 2021-12-04 ENCOUNTER — Other Ambulatory Visit: Payer: Self-pay | Admitting: Neurology

## 2021-12-10 ENCOUNTER — Telehealth: Payer: Self-pay | Admitting: Neurology

## 2021-12-10 NOTE — Telephone Encounter (Signed)
PA completed on CMM/[employer] ?[id number] ?Will await determination ?

## 2021-12-11 ENCOUNTER — Other Ambulatory Visit: Payer: Self-pay | Admitting: Neurology

## 2021-12-11 MED ORDER — LISDEXAMFETAMINE DIMESYLATE 70 MG PO CAPS: 70.0000 mg | ORAL_CAPSULE | Freq: Every day | ORAL | 0 refills | Status: DC

## 2021-12-11 NOTE — Telephone Encounter (Signed)
PA approved for the pt ?12/10/2021 - 12/10/2024 ?PA# [facility] Plan [phone] Non-Grandfathered [id number] [address] ?

## 2021-12-11 NOTE — Telephone Encounter (Signed)
Pt is wanting to know if this has been [facility] [place]  ?

## 2022-01-13 ENCOUNTER — Other Ambulatory Visit: Payer: Self-pay | Admitting: *Deleted

## 2022-01-13 DIAGNOSIS — Z79899 Other long term (current) drug therapy: Secondary | ICD-10-CM

## 2022-01-14 LAB — COMPLETE METABOLIC PANEL WITH GFR
AG Ratio: 1.8 (calc) (ref 1.0–2.5)
ALT: 26 U/L (ref 6–29)
AST: 27 U/L (ref 10–35)
Albumin: 4.3 g/dL (ref 3.6–5.1)
Alkaline phosphatase (APISO): 52 U/L (ref 37–153)
BUN: 16 mg/dL (ref 7–25)
CO2: 30 mmol/L (ref 20–32)
Calcium: 10 mg/dL (ref 8.6–10.4)
Chloride: 103 mmol/L (ref 98–110)
Creat: 0.8 mg/dL (ref 0.50–1.03)
Globulin: 2.4 g/dL (calc) (ref 1.9–3.7)
Glucose, Bld: 86 mg/dL (ref 65–99)
Potassium: 4.9 mmol/L (ref 3.5–5.3)
Sodium: 141 mmol/L (ref 135–146)
Total Bilirubin: 0.6 mg/dL (ref 0.2–1.2)
Total Protein: 6.7 g/dL (ref 6.1–8.1)
eGFR: 88 mL/min/{1.73_m2} (ref >=60)

## 2022-01-14 LAB — CBC WITH DIFFERENTIAL/PLATELET
Absolute Monocytes: 657 cells/uL (ref 200–950)
Basophils Absolute: 67 cells/uL (ref 0–200)
Basophils Relative: 1 %
Eosinophils Absolute: 87 cells/uL (ref 15–500)
Eosinophils Relative: 1.3 %
HCT: 46.1 % — ABNORMAL HIGH (ref 35.0–45.0)
Hemoglobin: 15.5 g/dL (ref 11.7–15.5)
Lymphs Abs: 2968 cells/uL (ref 850–3900)
MCH: 29.6 pg (ref 27.0–33.0)
MCHC: 33.6 g/dL (ref 32.0–36.0)
MCV: 88 fL (ref 80.0–100.0)
MPV: 10.7 fL (ref 7.5–12.5)
Monocytes Relative: 9.8 %
Neutro Abs: 2921 cells/uL (ref 1500–7800)
Neutrophils Relative %: 43.6 %
Platelets: 319 10*3/uL (ref 140–400)
RBC: 5.24 10*6/uL — ABNORMAL HIGH (ref 3.80–5.10)
RDW: 11.5 % (ref 11.0–15.0)
Total Lymphocyte: 44.3 %
WBC: 6.7 10*3/uL (ref 3.8–10.8)

## 2022-01-14 NOTE — Progress Notes (Signed)
CBC and CMP are normal.

## 2022-02-03 ENCOUNTER — Ambulatory Visit: Payer: BC Managed Care – PPO | Admitting: Rheumatology

## 2022-03-09 ENCOUNTER — Other Ambulatory Visit: Payer: Self-pay | Admitting: Neurology

## 2022-03-09 MED ORDER — LISDEXAMFETAMINE DIMESYLATE 70 MG PO CAPS: 70.0000 mg | ORAL_CAPSULE | Freq: Every day | ORAL | 0 refills | Status: DC

## 2022-03-09 NOTE — Telephone Encounter (Signed)
Pt request refill for lisdexamfetamine ([facility]VANSE[id number]

## 2022-03-09 NOTE — Telephone Encounter (Signed)
Patient is up-to-date on her appointments.  Patient is due for a refill on Vyvanse.  Controlled Substance Registry checked and is appropriate.

## 2022-03-20 NOTE — Progress Notes (Unsigned)
Office Visit Note  Patient: Kathleen Alvarez             Date of Birth: 1966/07/16           MRN: [id number]             PCP: [name], MD Referring: [name], MD Visit Date: 03/27/2022 Occupation: @GUAROCC @  Subjective:  No chief complaint on file.   History of Present Illness: Kathleen Alvarez is a 56 y.o. female ***   Activities of Daily Living:  Patient reports morning stiffness for *** {minute/hour:19697}.   Patient {ACTIONS;DENIES/REPORTS:21021675::"Denies"} nocturnal pain.  Difficulty dressing/grooming: {ACTIONS;DENIES/REPORTS:21021675::"Denies"} Difficulty climbing stairs: {ACTIONS;DENIES/REPORTS:21021675::"Denies"} Difficulty getting out of chair: {ACTIONS;DENIES/REPORTS:21021675::"Denies"} Difficulty using hands for taps, buttons, cutlery, and/or writing: {ACTIONS;DENIES/REPORTS:21021675::"Denies"}  No Rheumatology ROS completed.   PMFS History:  Patient Active Problem List   Diagnosis Date Noted  . History of transient ischemic attack (TIA) 09/19/2021  . TIA (transient ischemic attack) 11/27/2020  . Aortic calcification (HCC) 11/27/2020  . Familial hypercholesterolemia 11/27/2020  . Acute pain of right knee 08/20/2020  . High risk medication use 12/19/2019  . Strain of calf muscle 03/17/2018  . Rheumatoid arthritis involving multiple sites with positive rheumatoid factor (HCC) 05/03/2017  . Narcolepsy 09/15/2016  . 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 Narcolepsy without cataplexy 09/19/2014    Past Medical History:  Diagnosis Date  . Anxiety   . Hyperlipidemia   . 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 RA (rheumatoid arthritis) (HCC)   . TIA (transient ischemic attack)     Family History  Problem Relation Age of Onset  . Colon cancer Father        deceased  . Liver cancer Father 44       deceased   . Gout Brother    Past Surgical History:  Procedure Laterality Date  .  CESAREAN SECTION     x2  . COLONOSCOPY  2009   family history of colon ca  . DILITATION & CURRETTAGE/HYSTROSCOPY WITH NOVASURE ABLATION  08/26/2012   Procedure: DILATATION & CURETTAGE/HYSTEROSCOPY WITH NOVASURE ABLATION;  Surgeon: 08/28/2012, MD;  Location: [facility];  Service: Gynecology;  Laterality: N/A;  . lipoma     . WISDOM TOOTH EXTRACTION     Social History   Social History Narrative   Patient is employed.    Patient married with 2 children.      Immunization History  Administered Date(s) Administered  . Influenza, Seasonal, Injecte, Preservative Fre 08/31/2014  . Influenza,inj,Quad PF,6+ Mos 08/22/2019, 09/19/2020  . Moderna Sars-Covid-2 Vaccination 12/14/2019, 01/16/2020, 09/23/2020     Objective: Vital Signs: There were no vitals taken for this visit.   Physical Exam   Musculoskeletal Exam: ***  CDAI Exam: CDAI Score: -- Patient Global: --; Provider Global: -- Swollen: --; Tender: -- Joint Exam 03/27/2022   No joint exam has been documented for this visit   There is currently no information documented on the homunculus. Go to the Rheumatology activity and complete the homunculus joint exam.  Investigation: No additional findings.  Imaging: No results found.  Recent Labs: Lab Results  Component Value Date   WBC 6.7 01/13/2022   HGB 15.5 01/13/2022   PLT 319 01/13/2022   NA 141 01/13/2022   K 4.9 01/13/2022   CL 103 01/13/2022   CO2 30 01/13/2022   GLUCOSE 86 01/13/2022   BUN 16 01/13/2022   CREATININE 0.80 01/13/2022   BILITOT 0.6 01/13/2022   ALKPHOS 64 04/30/2021  AST 27 01/13/2022   ALT 26 01/13/2022   PROT 6.7 01/13/2022   ALBUMIN 4.4 04/30/2021   CALCIUM 10.0 01/13/2022   GFRAA 83 03/25/2021   QFTBGOLDPLUS NEGATIVE 09/03/2021    Speciality Comments: No specialty comments available.  Procedures:  No procedures performed Allergies: Peg [polyethylene glycol]   Assessment / Plan:     Visit Diagnoses: Rheumatoid arthritis  involving multiple sites with positive rheumatoid factor (HCC)  High risk medication use  Other fatigue  TIA (transient ischemic attack)  Dyslipidemia  Narcolepsy due to underlying condition without cataplexy  Postmenopausal  Attention deficit hyperactivity disorder (ADHD), predominantly inattentive type  Other insomnia  Orders: No orders of the defined types were placed in this encounter.  No orders of the defined types were placed in this encounter.   Face-to-face time spent with patient was *** minutes. Greater than 50% of time was spent in counseling and coordination of care.  Follow-Up Instructions: No follow-ups on file.   Gearldine Bienenstock, PA-C  Note - This record has been created using Dragon software.  Chart creation errors have been sought, but may not always  have been located. Such creation errors do not reflect on  the standard of medical care.

## 2022-03-27 ENCOUNTER — Ambulatory Visit: Payer: BC Managed Care – PPO | Admitting: Rheumatology

## 2022-03-27 ENCOUNTER — Encounter: Payer: Self-pay | Admitting: Rheumatology

## 2022-03-27 VITALS — BP 123/78 | HR 64 | Resp 14 | Ht 63.0 in | Wt 139.0 lb

## 2022-03-27 DIAGNOSIS — G459 Transient cerebral ischemic attack, unspecified: Secondary | ICD-10-CM

## 2022-03-27 DIAGNOSIS — R5383 Other fatigue: Secondary | ICD-10-CM

## 2022-03-27 DIAGNOSIS — G47429 Narcolepsy in conditions classified elsewhere without cataplexy: Secondary | ICD-10-CM

## 2022-03-27 DIAGNOSIS — M0579 Rheumatoid arthritis with rheumatoid factor of multiple sites without organ or systems involvement: Secondary | ICD-10-CM | POA: Diagnosis not present

## 2022-03-27 DIAGNOSIS — Z780 Asymptomatic menopausal state: Secondary | ICD-10-CM

## 2022-03-27 DIAGNOSIS — F900 Attention-deficit hyperactivity disorder, predominantly inattentive type: Secondary | ICD-10-CM

## 2022-03-27 DIAGNOSIS — E785 Hyperlipidemia, unspecified: Secondary | ICD-10-CM | POA: Diagnosis not present

## 2022-03-27 DIAGNOSIS — G4709 Other insomnia: Secondary | ICD-10-CM

## 2022-03-27 DIAGNOSIS — Z79899 Other long term (current) drug therapy: Secondary | ICD-10-CM | POA: Diagnosis not present

## 2022-05-14 ENCOUNTER — Telehealth: Payer: Self-pay | Admitting: Pharmacy Technician

## 2022-05-14 ENCOUNTER — Other Ambulatory Visit: Payer: Self-pay | Admitting: Physician Assistant

## 2022-05-14 DIAGNOSIS — M0579 Rheumatoid arthritis with rheumatoid factor of multiple sites without organ or systems involvement: Secondary | ICD-10-CM

## 2022-05-14 NOTE — Telephone Encounter (Signed)
Next Visit: 08/07/2022  Last Visit: 03/27/2022  Last Fill: 11/25/2021  HB:ZJIRCVELFY arthritis involving multiple sites with positive rheumatoid factor   Current Dose per office note 03/27/2022: Humira 40 mg every 14 days  Labs: 01/13/2022 CBC and CMP are normal   TB Gold: 09/03/2021 Neg    Left message to advise patient she is due to update labs.   Okay to refill Humira?

## 2022-05-14 NOTE — Telephone Encounter (Signed)
Submitted a Prior Authorization request to [facility] for HUMIRA via CoverMyMeds. Will update once we receive a response.   Key: [id number]

## 2022-05-17 ENCOUNTER — Encounter: Payer: Self-pay | Admitting: Rheumatology

## 2022-05-20 NOTE — Telephone Encounter (Signed)
Received notification from [facility] regarding a prior authorization for HUMIRA. Authorization has been APPROVED from 05/14/22 to 05/15/23.   Patient must continue to fill through [facility]: [phone]  Authorization # [id number]  Chesley Mires, PharmD, MPH, BCPS, CPP Clinical Pharmacist (Rheumatology and Pulmonology)

## 2022-06-02 ENCOUNTER — Telehealth: Payer: Self-pay | Admitting: Neurology

## 2022-06-02 NOTE — Telephone Encounter (Signed)
LVM and sent MyChart msg informing pt of r/s needed for 12/13 appt- MD out.

## 2022-06-03 ENCOUNTER — Other Ambulatory Visit: Payer: Self-pay | Admitting: Neurology

## 2022-06-03 MED ORDER — LISDEXAMFETAMINE DIMESYLATE 70 MG PO CAPS: 70.0000 mg | ORAL_CAPSULE | Freq: Every day | ORAL | 0 refills | Status: DC

## 2022-06-06 ENCOUNTER — Other Ambulatory Visit: Payer: Self-pay | Admitting: Cardiology

## 2022-06-06 DIAGNOSIS — G459 Transient cerebral ischemic attack, unspecified: Secondary | ICD-10-CM

## 2022-06-06 DIAGNOSIS — I700 Atherosclerosis of aorta: Secondary | ICD-10-CM

## 2022-06-06 DIAGNOSIS — E7800 Pure hypercholesterolemia, unspecified: Secondary | ICD-10-CM

## 2022-06-07 ENCOUNTER — Encounter: Payer: Self-pay | Admitting: Neurology

## 2022-06-09 ENCOUNTER — Other Ambulatory Visit: Payer: Self-pay | Admitting: *Deleted

## 2022-06-09 ENCOUNTER — Telehealth: Payer: Self-pay | Admitting: Neurology

## 2022-06-09 MED ORDER — AMPHETAMINE-DEXTROAMPHET ER 30 MG PO CP24: 30.0000 mg | ORAL_CAPSULE | Freq: Every day | ORAL | 0 refills | Status: DC

## 2022-06-09 MED ORDER — LISDEXAMFETAMINE DIMESYLATE 70 MG PO CAPS: 70.0000 mg | ORAL_CAPSULE | Freq: Every day | ORAL | 0 refills | Status: DC

## 2022-06-09 NOTE — Telephone Encounter (Signed)
"

## 2022-06-09 NOTE — Telephone Encounter (Signed)
"  Called [facility], [place] at [phone]. Spoke w/ [place].  States brand Vyvanse on back order nation wide. Just went generic but still waiting on generic to be delivered. Most pharmacies waiting for generic. "

## 2022-06-09 NOTE — Telephone Encounter (Signed)
Pt is calling and stated there is a shortage on  lisdexamfetamine (VYVANSE) 70 MG capsule and she has called several other pharmacy with no luck. Pt is requesting a call from nurse

## 2022-06-24 ENCOUNTER — Telehealth: Payer: Self-pay | Admitting: Neurology

## 2022-06-24 NOTE — Telephone Encounter (Signed)
LVM and sent mychart msg informing pt of need to reschedule 11/9 appointment - MD out 

## 2022-07-13 ENCOUNTER — Other Ambulatory Visit: Payer: Self-pay

## 2022-07-13 ENCOUNTER — Ambulatory Visit: Payer: BC Managed Care – PPO | Admitting: Neurology

## 2022-07-13 VITALS — BP 121/79 | HR 98 | Ht 63.0 in | Wt 141.6 lb

## 2022-07-13 DIAGNOSIS — Z8673 Personal history of transient ischemic attack (TIA), and cerebral infarction without residual deficits: Secondary | ICD-10-CM | POA: Diagnosis not present

## 2022-07-13 DIAGNOSIS — M0579 Rheumatoid arthritis with rheumatoid factor of multiple sites without organ or systems involvement: Secondary | ICD-10-CM | POA: Diagnosis not present

## 2022-07-13 DIAGNOSIS — Z111 Encounter for screening for respiratory tuberculosis: Secondary | ICD-10-CM

## 2022-07-13 DIAGNOSIS — G47419 Narcolepsy without cataplexy: Secondary | ICD-10-CM | POA: Diagnosis not present

## 2022-07-13 DIAGNOSIS — Z79899 Other long term (current) drug therapy: Secondary | ICD-10-CM

## 2022-07-13 MED ORDER — LISDEXAMFETAMINE DIMESYLATE 70 MG PO CAPS: 70.0000 mg | ORAL_CAPSULE | Freq: Every day | ORAL | 0 refills | Status: DC

## 2022-07-13 NOTE — Patient Instructions (Signed)
Narcolepsy Narcolepsy is a neurological disorder that causes people to fall asleep suddenly and without control (have sleep attacks) during the daytime. It is a lifelong disorder. Narcolepsy disrupts the sleep cycle at night, which then causes daytime sleepiness. What are the causes? The cause of narcolepsy is not fully understood, but it may be related to: Low levels of hypocretin, a chemical (neurotransmitter) in the brain that controls sleep and wake cycles. Hypocretin imbalance may be caused by: Abnormal genes that are passed from parent to child (inherited). An autoimmune disease in which the body's defense system (immune system) attacks the brain cells that make hypocretin. Infection, tumor, or injury in the area of the brain that controls sleep. Exposure to poisons (toxins), such as heavy metals, pesticides, and secondhand smoke. What are the signs or symptoms? Symptoms of this condition include: Excessive daytime sleepiness. This is the most common symptom and is usually the first symptom you will notice. This may affect your performance at work or school. Sleep attacks. You may fall asleep in the middle of an activity, especially low-energy activities like reading or watching TV. Feeling like you cannot think clearly and trouble focusing or remembering things. You may also feel depressed. Sudden muscle weakness (cataplexy). When this occurs, your speech may become slurred, or your knees may buckle. Cataplexy is usually triggered by surprise, anger, fear, or laughter. Losing the ability to speak or move (sleep paralysis). This may occur just as you start to fall asleep or wake up. You will be aware of the paralysis. It usually lasts for just a few seconds or minutes. Seeing, hearing, tasting, smelling, or feeling things that are not real (hallucinations). Hallucinations may occur with sleep paralysis. They can happen when you are falling asleep, waking up, or dozing. Trouble staying asleep  at night (insomnia) and restless sleep. How is this diagnosed? This condition may be diagnosed based on: A physical exam to rule out any other problems that may be causing your symptoms. You may be asked to write down your sleeping patterns for several weeks in a sleep diary. This will help your health care provider make a diagnosis. Sleep studies that measure how well your REM sleep is regulated. These tests also measure your heart rate, breathing, movement, and brain waves. These tests include: An overnight sleep study (polysomnogram). A daytime sleep study that is done while you take several naps during the day (multiple sleep latency test, MSLT). This test measures how quickly you fall asleep and how quickly you enter REM sleep. Removal of spinal fluid to measure hypocretin levels. How is this treated? There is no cure for this condition, but treatment can help relieve symptoms. Treatment may include: Lifestyle and sleeping strategies to help you cope with the condition, such as: Exercising regularly. Maintaining a regular sleep schedule. Avoiding caffeine and large meals before bed. Medicines. These may include: Medicines that help keep you awake and alert (stimulants) to fight daytime sleepiness. Medicines that treat depression (antidepressants). These may be used to treat cataplexy. Sodium oxybate. This is a strong medicine to help you relax (sedative) that you may take at night. It can help control daytime sleepiness and cataplexy. Other treatments may include mental health counseling or joining a support group. Follow these instructions at home: Sleeping habits  Get about 8 hours of sleep every night. Go to sleep and get up at about the same time every day. Keep your bedroom dark, quiet, and comfortable. When you feel very tired, take short naps. Schedule naps   so that you take them at about the same time every day. Before bedtime: Avoid bright lights and screens. Relax. Try  activities like reading or taking a warm bath. Activity Get at least 20 minutes of exercise every day. This will help you sleep better at night and reduce daytime sleepiness. Avoid exercising within 3 hours of bedtime. Do not drive or use heavy machinery if you are sleepy. If possible, take a nap before driving. Do not swim or go out on the water without a life jacket. Eating and drinking Do not drink alcohol or caffeinated beverages within 4-5 hours of bedtime. Do not eat a large meal before bedtime. Eat meals at about the same times every day. General instructions  Take over-the-counter and prescription medicines only as told by your health care provider. Keep a sleep diary as told by your health care provider. Tell your employer or teachers that you have narcolepsy. You may be able to adjust your schedule to include time for naps. Do not use any products that contain nicotine or tobacco, such as cigarettes, e-cigarettes, and chewing tobacco. If you need help quitting, ask your health care provider. Keep all follow-up visits as told by your health care provider. This is important. Where to find more information National Institute of Neurological Disorders: www.ninds.nih.gov Contact a health care provider if: Your symptoms are not getting better. You have increasingly high blood pressure (hypertension). You have changes in your heart rhythm. You are having a hard time determining what is real and what is not (psychosis). Get help right away if you: Hurt yourself during a sleep attack or an attack of cataplexy. Have chest pain. Have trouble breathing. These symptoms may represent a serious problem that is an emergency. Do not wait to see if the symptoms will go away. Get medical help right away. Call your local emergency services (911 in the [place] Do not drive yourself to the hospital. Summary Narcolepsy is a neurological disorder that causes people to fall asleep suddenly, and without  control, during the daytime (sleep attacks). It is a lifelong disorder. There is no cure for this condition, but treatment can help relieve symptoms. Go to sleep and get up at about the same time every day. Follow instructions about sleep and activities as told by your health care provider. Take over-the-counter and prescription medicines only as told by your health care provider. This information is not intended to replace advice given to you by your health care provider. Make sure you discuss any questions you have with your health care provider. Document Revised: 10/27/2021 Document Reviewed: 05/03/2019 Elsevier Patient Education  2023 Elsevier Inc.  

## 2022-07-13 NOTE — Progress Notes (Signed)
PATIENT: Kathleen Alvarez DOB: 07/09/1966  REASON FOR VISIT: follow up/ narcolepsy patient with EDS, sleep disturbance with menopausal symptoms, sleep benefiting HRT may have let to a TIA>   HISTORY FROM: patient, here alone.    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  The patient has been treated with initially Adderall and later Vyvanse to tolerates this medication very well.  That there was a recent shortage and she had to switch on the generic form at 70 mg daily po.  She is doing well on this generic form of Vyvanse.  Doing the bridge over.  She was on 30 mg of Adderall and did not feel that this left up to her expectation.  She has been sleeping- with 1 or 2  interruptions each night. On weekends she sleeps into the 9 AM range. Weekdays up at 7 AM. Naps in daytime  Vivid dreams are present, no cataplexy, Effexor controlled the lucid dreaming. 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  Here for refills.  The Epworth Sleepiness Scale was  endorsed at 16/24  points  when not medications- On medication; the Epworth score is today at 10/ 24 points.  with the 2 highest ratings for sitting and reading at 3 and lying down in the afternoon with 3 points.  Sitting and talking to someone is only endorsed at 1 point as is driving her own car.  She has not had sleep attacks while driving.  Without medication however she would be in danger.    Her fit fatigue severity scale was endorsed at   30/ 63 while on meds.         RV with Kathleen Alvarez , a 56 year-old [place] -[place] female : Interval history , patient had COVID 19 in January 2022 , was mildly symptomatic, mostly GI, nauseated since.  She was on hormone replacement therapy, when she lost 8 pounds ( and happy about that) and started to sleep better and now experienced a TIA. ; I believe this truly was a TIA, as she experienced speech arrest , her right  hand was "floppy"- uncontrolled and she felt numb- and she couldn't count-  all this started during a work out- total time about 5 minute episode. She also added that the coach present at the time of her workout noticed that her right face was droopy.   Not a seizure presentation- HRT had been immediately discontinued. She now reports being back on HRT, and sleeping good again. We discussed that she needs to be on antiplatelet therapy and while I am comfortable with very other day aspirin, her cardiologist asked her to take it daily. She also was placed on cholesterol medication, which she tolerated well. Reviewed her labs. No clotting abnormalities, she reported normal results of cardiac monitoring. New PCP is with [facility], labs not accessible. Marland Kitchen   11-27-2020 Patient presents with possible - probable TIA- She was seen at the [facility] emergency room on 21 November 2020 within an hour of her experience.  A head CT and an MRI of the brain were both obtained- Showing  aortic atherosclerotic changes,  but widely patent carotid arteries . Radiologist noted she has a little bit of cervical spondylosis, her CTA showed all large and medium sized vessels to be flow-void ( patent).  Carotid arteries and vertebral arteries also were normal.  There was some atherosclerotic noted in the aorta  that was the  only significant finding.  As to the work-up in the emergency room the episode was deemed not having lasted longer in total as 1 minute , the patient today thinks that it may have been within 5 minutes that she became aware of 3 different symptoms.  She also added that the coach present at the time of her workout noticed that her right face was droopy.  she had speech arrest, she had control loss of her right arm, she was unable to count and felt somewhat confused.   Therefore I agree with the discontinuation of the hormone replacement therapy at this time but we are going to do is to add a cardiac monitor and I like for  her to have a complete blood clotting lab evaluation.   History of encounters:   HISTORY 2016 :Kathleen Alvarez is a 56 y.o. female and  seen here as a revisit for medication refill in the treatment of narcolepsy. 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  She had symptoms of this condition since her early college years and was first seen in this practice in 2007. For the treatment of for hypersomnia she was started on Provigil but within 6 months she had noted some irritability and anger on that medication, this in addition was more complicated as the patient became pregnant.  Provigil was therefore discontinued and she was started on Adderall , Tolerated much better for about a year she was eventually changed to Vyvanse in 2008 . In April 2016 she was diagnosed with rheumatoid arthritis, takes MTX. this condition deepens her fatigue further.  She continues to do well on Vyvanse after over 7 years. She still sleeps 10-12 hours over a 24 hour period. She is not snoring. No apnea.    Interval history from 09/15/2016. I have the pleasure of seeing Kathleen Alvarez, for routine revisit. The patient introduced to tolerate Vyvanse which she will have taken for 10 years next year. Refills today. No evidence of hypertension, hypersomnia, hyperglycemia.   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  Her degree as an exercise physiology. She endorsed the Epworth sleepiness scale today still at 17 points without medicine and under the influence of Vyvanse around 11 points. We continue on Vyvanse. She has been on the medication now for 10 years. She has been sleepy all her adult life, since college, perhaps late years of high school.   2019 "Kathleen Alvarez  Kathleen Alvarez is  a 56 year old female with a history of narcolepsy.  She returns today for follow-up.  She is currently on Vyvanse.  She reports that she tolerates this well.  This continues to work well for her.  She denies falling asleep at work or while driving.  Denies any heart palpitations or depression.  She states that she does have an Adderall prescription but she only uses this if she has an episode where she cannot stay awake.  Reports that she only uses this approximately 5 times a year.  She denies any cataplectic events.  She returns today for evaluation.  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  She reports ongoing effect from Vyvanse, keeping her alert and safe and productive. She is now in perimenopause.  She is going through insulin resistance - her rheumatologist follows her for rheumatoid arthritis.  She has developed abdominal fat, and is frustrated about hat. She began craving carbs.  We are dicussing sleep aids at [facility]. Trazodone stands out for a patient with dry eyes and dry mouth, as it doesn't have these side effect.  During the pandemic her teaching has been on hold.   RV : 10-09-2020. Interval history for this long -time established 56 year-old patient of mine with hypersomnia,seen here as a revisit for medication refill in the treatment of narcolepsy. She attributes weight gain to menopause.  She is reporting a lot of fatigue. Her job has changed a lot with the pandemic. 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     REVIEW OF SYSTEMS: Out of a complete 14 system review of symptoms, the patient complains only of the following symptoms, and all other reviewed systems are negative. How likely are you to doze in the following situations: 0 = not likely, 1 = slight  chance, 2 = moderate chance, 3 = high chance  Sitting and Reading?3 Watching Television? 2 Sitting inactive in a public place (theater or meeting)? 2 Lying down in the afternoon when circumstances permit?33 Sitting and talking to someone? 1 Sitting quietly after lunch without alcohol?2 In a car, while stopped for a few minutes in traffic?2 As a passenger in a car for an hour without a break?3  Total = 12/ 24 (!)   on 09-16-2021, on medication.   11-27-20: 16/ 24   07-13-2022: The Epworth Sleepiness Scale was  endorsed at 16/24  points  when not medications- On medication; the Epworth score is today at 10/ 24 points.  with the 2 highest ratings for sitting and reading at 3 and lying down in the afternoon with 3 points.  Sitting and talking to someone is only endorsed at 1 point as is driving her own car.  She has not had sleep attacks while driving.  Without medication however she would be in danger.    Her fit fatigue severity scale was endorsed at   30/ 63 while on meds.   FSS at 40/63  Insomnia with hot flushes.     ALLERGIES: Allergies  Allergen Reactions   Peg [Polyethylene Glycol] Itching and Nausea And Vomiting    Used when had colonoscopy prep    HOME MEDICATIONS: Outpatient Medications Prior to Visit  Medication Sig Dispense Refill   ACETYLCHOLINE CHLORIDE IO Take 1 capsule by mouth daily.     albuterol (PROVENTIL HFA;VENTOLIN HFA) 108 (90 BASE) MCG/ACT inhaler Inhale 2 puffs into the lungs every 6 (six) hours as needed. For exercise-induced bronchospasm     amphetamine-dextroamphetamine (ADDERALL) 10 MG tablet Take 10 mg by mouth as needed (alertness).     ASPIRIN LOW DOSE 81 MG EC tablet TAKE 1 TABLET BY MOUTH DAILY 90 tablet 3   BIJUVA 1-100 MG CAPS Take 1 capsule by mouth daily.     cholecalciferol (VITAMIN D) 1000 UNITS tablet Take 5,000 Units by mouth. Monday-Friday     HUMIRA PEN 40 MG/0.4ML PNKT INJECT 1 PEN UNDER THE SKIN EVERY 14 DAYS. 2 each 0    ibuprofen (ADVIL) 800 MG tablet Take 1 tablet (800 mg total) by mouth every 8 (eight) hours as needed. (Patient taking differently: Take 800 mg by  mouth every 8 (eight) hours as needed for headache or mild pain.) 12 tablet 0   lisdexamfetamine (VYVANSE) 70 MG capsule Take 1 capsule (70 mg total) by mouth daily. 90 capsule 0   Multiple Vitamin (MULTIVITAMIN WITH MINERALS) TABS Take 1 tablet by mouth daily.     NON FORMULARY Take 1 capsule by mouth daily. Tributyrin 350     NON FORMULARY Take 1 capsule by mouth daily. Homocysteine     NON FORMULARY Take 1 capsule by mouth daily. Essential aminos     Omega-3 Fatty Acids (FISH OIL PO) Take 2,400 mg by mouth in the morning and at bedtime.     Probiotic Product (PROBIOTIC DAILY PO) Take 1 capsule by mouth as needed.     rosuvastatin (CRESTOR) 5 MG tablet TAKE 1 TABLET BY MOUTH EVERYDAY AT BEDTIME 90 tablet 1   venlafaxine (EFFEXOR) 37.5 MG tablet Take 37.5 mg by mouth daily.     amphetamine-dextroamphetamine (ADDERALL XR) 30 MG 24 hr capsule Take 1 capsule (30 mg total) by mouth daily. (Patient not taking: Reported on 07/13/2022) 12 capsule 0   No facility-administered medications prior to visit.    PAST MEDICAL HISTORY: Past Medical History:  Diagnosis Date   Anxiety    Hyperlipidemia    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   RA (rheumatoid arthritis) (HCC)    TIA (transient ischemic attack)    TIA one time (  and 5 minute duration, left brain- with speech arrest , inability to count, inability to control the right arm.  Facial droop - she is not sure - was described as on the right, lasting 5 minutes).  On aspirin and rovastatin- genetically high cholesterol, lowered from 165 to 103 now. Calcium score test was high ( 23) , CT of the heart- stress test was good,  She had no bruising or gum bleeds, no epistaxis. No loop recorder.yet . Her osteopathic - chiropractor wants her to have a methylation score - I can't find anything in  [facility]'s menu that would match this.   PAST SURGICAL HISTORY: Past Surgical History:  Procedure Laterality Date   CESAREAN SECTION     x2   COLONOSCOPY  2009   family history of colon ca   DILITATION & CURRETTAGE/HYSTROSCOPY WITH NOVASURE ABLATION  08/26/2012   Procedure: DILATATION & CURETTAGE/HYSTEROSCOPY WITH NOVASURE ABLATION;  Surgeon: Lenoard Aden, MD;  Location: [facility];  Service: Gynecology;  Laterality: N/A;   lipoma      WISDOM TOOTH EXTRACTION      FAMILY HISTORY: Family History  Problem Relation Age of Onset   Colon cancer Father        deceased   Liver cancer Father 24       deceased    Gout Brother     SOCIAL HISTORY: Social History   Socioeconomic History   Marital status: Married    Spouse name: Kathleen Alvarez    Number of children: 2   Years of education: 12+   Highest education level: Not on file  Occupational History   Occupation: Photographer: [employer]  Tobacco Use   Smoking status: Never   Smokeless tobacco: Never  Vaping Use   Vaping Use: Never used  Substance and Sexual Activity   Alcohol use: No   Drug use: No   Sexual activity: Not on file  Other Topics Concern   Not on file  Social History Narrative   Patient is employed.    Patient  married with 2 children.      Social Determinants of Health   Financial Resource Strain: Not on file  Food Insecurity: Not on file  Transportation Needs: Not on file  Physical Activity: Not on file  Stress: Not on file  Social Connections: Not on file  Intimate Partner Violence: Not on file      PHYSICAL EXAM  Vitals:   07/13/22 1322  BP: 121/79  Pulse: 98  Weight: 141 lb 9.6 oz (64.2 kg)  Height:  (1.6 m)   Body mass index is 25.08 kg/m.  Generalized: Well developed, in no acute distress   Skin-  Dry and warm. No edema, no rash.  Posture- erect. BMI 27. Regular heart rate, no murmur, LCTA.   Neurological examination  Mentation:  No loss of taste or smell-  Alert oriented to time, place, history taking.  Follows all commands her speech is clear and fluent Cranial nerves :  Extraocular movements were full, visual field were full on confrontational test. Facial sensation and strength were normal. Head turning and shoulder shrug were normal and symmetric. Motor:  full strength of all 4 extremities. No cog-wheeling, no spatsicity-   Good grip strength,  Hands have been stiff through rheumatoid arthritis. Finger flexion and extension can be painful- Good symmetric motor tone is noted throughout.  Sensory:  intact to soft touch  and vibration,on all 4 extremities. No evidence of extinction is noted.  Coordination: finger to nose intact. No tremor, no dysmetria.  No changes in penmanship  Gait and station:  She demonstrated good balance, turns with 3 steps, no limp. Straight posture.  We did not check ROMBERG or tandem gait.  DTR symmetric, intact, no clonus.    DIAGNOSTIC DATA (LABS, IMAGING, TESTING) - I reviewed patient records, labs, notes, testing and imaging myself where available.   TIA work up reviewed.   Vaccine documentation - Covid 19:  full vaccinated with 2 doses with Moderna , now boostered times 2.    Lab Results  Component Value Date   WBC 6.7 01/13/2022   HGB 15.5 01/13/2022   HCT 46.1 (H) 01/13/2022   MCV 88.0 01/13/2022   PLT 319 01/13/2022      Component Value Date/Time   NA 141 01/13/2022 1532   NA 137 04/30/2021 1015   K 4.9 01/13/2022 1532   CL 103 01/13/2022 1532   CO2 30 01/13/2022 1532   GLUCOSE 86 01/13/2022 1532   BUN 16 01/13/2022 1532   BUN 12 04/30/2021 1015   CREATININE 0.80 01/13/2022 1532   CALCIUM 10.0 01/13/2022 1532   PROT 6.7 01/13/2022 1532   PROT 6.5 04/30/2021 1015   ALBUMIN 4.4 04/30/2021 1015   AST 27 01/13/2022 1532   ALT 26 01/13/2022 1532   ALKPHOS 64 04/30/2021 1015   BILITOT 0.6 01/13/2022 1532   BILITOT 0.3 04/30/2021 1015   GFRNONAA 72 03/25/2021 1450   GFRAA 83 03/25/2021  1450     ASSESSMENT AND PLAN 56 y.o. year old female here with:  1. 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    2 I refilled her VYVANSE, now generic, 70 mg daily.   She will follow-up in person in12 month for refills. She just got established with a PCP, Dr. Docia Chuck, MD  . He prescribed effexor.   We can arrange for the 6 months interval RV virtually, can be done with NP.  I spent 23 minutes with the patient face to face. 50% of this time  was spent discussing her medication.  Melvyn Novas, MD   07/13/2022, 1:52 PM [facility] [address], [address] [place], [place] [address] [phone]

## 2022-07-17 LAB — COMPLETE METABOLIC PANEL WITH GFR
AG Ratio: 1.6 (calc) (ref 1.0–2.5)
ALT: 27 U/L (ref 6–29)
AST: 28 U/L (ref 10–35)
Albumin: 4.6 g/dL (ref 3.6–5.1)
Alkaline phosphatase (APISO): 54 U/L (ref 37–153)
BUN: 16 mg/dL (ref 7–25)
CO2: 31 mmol/L (ref 20–32)
Calcium: 10.4 mg/dL (ref 8.6–10.4)
Chloride: 104 mmol/L (ref 98–110)
Creat: 0.8 mg/dL (ref 0.50–1.03)
Globulin: 2.8 g/dL (calc) (ref 1.9–3.7)
Glucose, Bld: 87 mg/dL (ref 65–99)
Potassium: 5 mmol/L (ref 3.5–5.3)
Sodium: 142 mmol/L (ref 135–146)
Total Bilirubin: 0.8 mg/dL (ref 0.2–1.2)
Total Protein: 7.4 g/dL (ref 6.1–8.1)
eGFR: 88 mL/min/{1.73_m2} (ref >=60)

## 2022-07-17 LAB — CBC WITH DIFFERENTIAL/PLATELET
Absolute Monocytes: 683 cells/uL (ref 200–950)
Basophils Absolute: 79 cells/uL (ref 0–200)
Basophils Relative: 0.8 %
Eosinophils Absolute: 79 cells/uL (ref 15–500)
Eosinophils Relative: 0.8 %
HCT: 45.7 % — ABNORMAL HIGH (ref 35.0–45.0)
Hemoglobin: 15.8 g/dL — ABNORMAL HIGH (ref 11.7–15.5)
Lymphs Abs: 2228 cells/uL (ref 850–3900)
MCH: 31.1 pg (ref 27.0–33.0)
MCHC: 34.6 g/dL (ref 32.0–36.0)
MCV: 90 fL (ref 80.0–100.0)
MPV: 10.4 fL (ref 7.5–12.5)
Monocytes Relative: 6.9 %
Neutro Abs: 6831 cells/uL (ref 1500–7800)
Neutrophils Relative %: 69 %
Platelets: 329 10*3/uL (ref 140–400)
RBC: 5.08 10*6/uL (ref 3.80–5.10)
RDW: 11.9 % (ref 11.0–15.0)
Total Lymphocyte: 22.5 %
WBC: 9.9 10*3/uL (ref 3.8–10.8)

## 2022-07-17 LAB — QUANTIFERON-TB GOLD PLUS
Mitogen-NIL: 7.57 IU/mL
NIL: 0.02 IU/mL
QuantiFERON-TB Gold Plus: NEGATIVE
TB1-NIL: 0.01 IU/mL
TB2-NIL: 0.01 IU/mL

## 2022-07-17 NOTE — Progress Notes (Signed)
CBC, CMP are normal.  TB Gold is negative.

## 2022-07-24 NOTE — Progress Notes (Signed)
Office Visit Note  Patient: Kathleen Alvarez             Date of Birth: [birth year]           MRN: [id number]             PCP: Lujean Amel, MD Referring: Brien Few, MD Visit Date: 08/07/2022 Occupation: @GUAROCC @  Subjective:  Medication management  History of Present Illness: Kathleen Alvarez is a 56 y.o. female with history of seropositive rheumatoid arthritis.  She has been spacing Humira to every 3 weeks since June 2023.  She has not experienced any flares.  She missed her last dose of Humira and the last dose was 4 weeks apart.  She has been exercising on a regular basis and has not experienced any increased joint pain or joint swelling.  Activities of Daily Living:  Patient reports morning stiffness for 0 minutes.   Patient Denies nocturnal pain.  Difficulty dressing/grooming: Denies Difficulty climbing stairs: Denies Difficulty getting out of chair: Denies Difficulty using hands for taps, buttons, cutlery, and/or writing: Denies  Review of Systems  Constitutional:  Negative for fatigue, night sweats, weight gain and weight loss.  HENT:  Negative for mouth sores, trouble swallowing, trouble swallowing, mouth dryness and nose dryness.   Eyes:  Negative for pain, redness, visual disturbance and dryness.  Respiratory:  Negative for cough, shortness of breath and difficulty breathing.   Cardiovascular:  Negative for chest pain, palpitations, hypertension, irregular heartbeat and swelling in legs/feet.  Gastrointestinal:  Negative for blood in stool, constipation and diarrhea.  Endocrine: Negative for increased urination.  Genitourinary:  Negative for involuntary urination and vaginal dryness.  Musculoskeletal:  Negative for joint pain, gait problem, joint pain, joint swelling, myalgias, muscle weakness, morning stiffness, muscle tenderness and myalgias.  Skin:  Negative for color change, rash, hair loss, skin tightness, ulcers and sensitivity to sunlight.  Allergic/Immunologic:  Negative for susceptible to infections.  Neurological:  Negative for dizziness, headaches, memory loss, night sweats and weakness.  Hematological:  Negative for swollen glands.  Psychiatric/Behavioral:  Positive for sleep disturbance. Negative for depressed mood. The patient is not nervous/anxious.     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    Past Medical History:  Diagnosis Date   Anxiety    Hyperlipidemia    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   RA (rheumatoid arthritis) (HCC)    TIA (transient ischemic attack)     Family History  Problem Relation Age of Onset   Colon cancer Father        deceased   Liver cancer Father 70       deceased    Gout Brother    Past Surgical History:  Procedure Laterality Date   CESAREAN SECTION     x2   COLONOSCOPY  2009   family history of colon ca   DILITATION & CURRETTAGE/HYSTROSCOPY WITH NOVASURE ABLATION  08/26/2012   Procedure: DILATATION & CURETTAGE/HYSTEROSCOPY WITH NOVASURE ABLATION;  Surgeon: Lovenia Kim, MD;  Location: [facility];  Service: Gynecology;  Laterality: N/A;   lipoma      WISDOM TOOTH EXTRACTION     Social History   Social History Narrative   Patient is employed.    Patient married with 2 children.      Immunization History  Administered Date(s) Administered   Influenza, Seasonal, Injecte, Preservative Fre 08/31/2014    Influenza,inj,Quad PF,6+ Mos 08/22/2019, 09/19/2020   Moderna Sars-Covid-2 Vaccination 12/14/2019, 01/16/2020, 09/23/2020     Objective: Vital Signs: BP 126/85 (BP Location: Left Arm, Patient Position: Sitting, Cuff Size: Normal)   Pulse 66   Resp 14   Ht 5\' 3"  (1.6 m)   Wt 143 lb 3.2 oz (65 kg)   BMI 25.37 kg/m    Physical Exam Vitals and nursing note reviewed.  Constitutional:      Appearance: She is well-developed.  HENT:     Head: Normocephalic and atraumatic.  Eyes:     Conjunctiva/sclera: Conjunctivae normal.  Cardiovascular:     Rate and Rhythm: Normal rate and regular rhythm.     Heart sounds: Normal heart sounds.  Pulmonary:     Effort: Pulmonary effort is normal.     Breath sounds: Normal breath sounds.  Abdominal:     General: Bowel sounds are normal.     Palpations: Abdomen is soft.  Musculoskeletal:     Cervical back: Normal range of motion.  Lymphadenopathy:     Cervical: No cervical adenopathy.  Skin:    General: Skin is warm and dry.     Capillary Refill: Capillary refill takes less than 2 seconds.  Neurological:     Mental Status: She is alert and oriented to person, place, and time.  Psychiatric:        Behavior: Behavior normal.      Musculoskeletal Exam: Cervical spine was in good range of motion.  Shoulder joints, elbow joints, wrist joints, MCPs PIPs and DIPs with good range of motion.  She had bilateral PIP and DIP thickening with no synovitis.  She had callus formation over her palmar aspect.  Hip joints and knee joints in good range of motion.  There was no tenderness over ankles or MTPs.  CDAI Exam: CDAI Score: -- Patient Global: 0 mm; Provider Global: 0 mm Swollen: --; Tender: -- Joint Exam 08/07/2022   No joint exam has been documented for this visit   There is currently no information documented on the homunculus. Go to the Rheumatology activity and complete the homunculus joint exam.  Investigation: No additional  findings.  Imaging: No results found.  Recent Labs: Lab Results  Component Value Date   WBC 9.9 07/13/2022   HGB 15.8 (H) 07/13/2022   PLT 329 07/13/2022   NA 142 07/13/2022   K 5.0 07/13/2022   CL 104 07/13/2022   CO2 31 07/13/2022   GLUCOSE 87 07/13/2022   BUN 16 07/13/2022   CREATININE 0.80 07/13/2022   BILITOT 0.8 07/13/2022   ALKPHOS 64 04/30/2021   AST 28 07/13/2022   ALT 27 07/13/2022   PROT 7.4 07/13/2022   ALBUMIN 4.4 04/30/2021   CALCIUM 10.4 07/13/2022   GFRAA 83 03/25/2021   QFTBGOLDPLUS NEGATIVE 07/13/2022    Speciality Comments: No specialty comments available.  Procedures:  No procedures performed Allergies: Peg [polyethylene glycol]   Assessment / Plan:     Visit Diagnoses: Rheumatoid arthritis involving multiple sites with positive rheumatoid factor (HCC) - +RF, +CCP.  -Patient denies having any flares since the last visit.  She has been spacing Humira every 3 weeks since the last visit.  She has not had any joint pain or joint swelling.  No synovitis was noted on the examination today.  Prescription refill for Humira was given.  Plan: Adalimumab (HUMIRA PEN) 40 MG/0.4ML PNKT  High risk medication use - Humira 40 mg every 14 days.  Labs obtained on July 13, 2022 CBC and CMP were normal.  TB gold was negative on July 13, 2022.  She was advised to get CBC and CMP in January and every 3 months to monitor for drug toxicity.  Information regarding immunization was placed in the AVS.  She was advised to hold Humira if she develops an infection and resume after the infection resolves.  Annual skin examination to screen for skin cancer was also advised.  Nancy Fetter protection was advised.  Other fatigue-improved.  TIA (transient ischemic attack) - February 2022.  She was placed on Crestor.  Narcolepsy due to underlying condition without cataplexy  Postmenopausal - patient had a DEXA scan on May 18, 2018 which was within normal limits.  Attention deficit  hyperactivity disorder (ADHD), predominantly inattentive type  Dyslipidemia-she is currently on Crestor.  Orders: No orders of the defined types were placed in this encounter.  Meds ordered this encounter  Medications   Adalimumab (HUMIRA PEN) 40 MG/0.4ML PNKT    Sig: Inject 40 mg into the skin every 14 (fourteen) days.    Dispense:  6 each    Refill:  0     Follow-Up Instructions: Return in about 5 months (around 01/06/2023) for Rheumatoid arthritis.   Bo Merino, MD  Note - This record has been created using Editor, commissioning.  Chart creation errors have been sought, but may not always  have been located. Such creation errors do not reflect on  the standard of medical care.

## 2022-08-07 ENCOUNTER — Ambulatory Visit: Payer: BC Managed Care – PPO | Attending: Rheumatology | Admitting: Rheumatology

## 2022-08-07 ENCOUNTER — Encounter: Payer: Self-pay | Admitting: Rheumatology

## 2022-08-07 VITALS — BP 126/85 | HR 66 | Resp 14 | Ht 63.0 in | Wt 143.2 lb

## 2022-08-07 DIAGNOSIS — G47429 Narcolepsy in conditions classified elsewhere without cataplexy: Secondary | ICD-10-CM

## 2022-08-07 DIAGNOSIS — E785 Hyperlipidemia, unspecified: Secondary | ICD-10-CM

## 2022-08-07 DIAGNOSIS — R5383 Other fatigue: Secondary | ICD-10-CM | POA: Diagnosis not present

## 2022-08-07 DIAGNOSIS — Z780 Asymptomatic menopausal state: Secondary | ICD-10-CM

## 2022-08-07 DIAGNOSIS — M0579 Rheumatoid arthritis with rheumatoid factor of multiple sites without organ or systems involvement: Secondary | ICD-10-CM | POA: Diagnosis not present

## 2022-08-07 DIAGNOSIS — G459 Transient cerebral ischemic attack, unspecified: Secondary | ICD-10-CM | POA: Diagnosis not present

## 2022-08-07 DIAGNOSIS — Z79899 Other long term (current) drug therapy: Secondary | ICD-10-CM | POA: Diagnosis not present

## 2022-08-07 DIAGNOSIS — F900 Attention-deficit hyperactivity disorder, predominantly inattentive type: Secondary | ICD-10-CM

## 2022-08-07 MED ORDER — HUMIRA (2 PEN) 40 MG/0.4ML ~~LOC~~ AJKT: 40.0000 mg | AUTO-INJECTOR | SUBCUTANEOUS | 0 refills | Status: DC

## 2022-08-07 NOTE — Patient Instructions (Signed)
Standing Labs We placed an order today for your standing lab work.   Please have your standing labs drawn in  January and every 3 months t (CBC with differential and CMP with GFR)  Please have your labs drawn 2 weeks prior to your appointment so that the provider can discuss your lab results at your appointment.  Please note that you may see your imaging and lab results in [place] [facility] before we have reviewed them. We will contact you once all results are reviewed. Please allow our office up to 72 hours to thoroughly review all of the results before contacting the office for clarification of your results.  Lab hours are:   Monday through Thursday from 8:00 am -12:30 pm and 1:00 pm-5:00 pm and Friday from 8:00 am-12:00 pm.  Please be advised, all patients with office appointments requiring lab work will take precedent over walk-in lab work.   Labs are drawn by [facility]. Please bring your co-pay at the time of your lab draw.  You may receive a bill from [facility] for your lab work.  Please note if you are on Hydroxychloroquine and and an order has been placed for a Hydroxychloroquine level, you will need to have it drawn 4 hours or more after your last dose.  If you wish to have your labs drawn at another location, please call the office 24 hours in advance so we can fax the orders.  The office is located at [address], [place], [place], [place] [address] No appointment is necessary.    If you have any questions regarding directions or hours of operation,  please call [phone].   As a reminder, please drink plenty of water prior to coming for your lab work. Thanks!   Vaccines You are taking a medication(s) that can suppress your immune system.  The following immunizations are recommended: Flu annually Covid-19  Td/Tdap (tetanus, diphtheria, pertussis) every 10 years Pneumonia (Prevnar 15 then Pneumovax 23 at least 1 year apart.  Alternatively, can take Prevnar 20 without needing  additional dose) Shingrix: 2 doses from 4 weeks to 6 months apart  Please check with your PCP to make sure you are up to date.   If you have signs or symptoms of an infection or start antibiotics: First, call your PCP for workup of your infection. Hold your medication through the infection, until you complete your antibiotics, and until symptoms resolve if you take the following: Injectable medication (Actemra, Benlysta, Cimzia, Cosentyx, Enbrel, Humira, Kevzara, Orencia, Remicade, Simponi, Stelara, Taltz, Tremfya) Methotrexate Leflunomide (Arava) Mycophenolate (Cellcept) Morrie Sheldon, Olumiant, or Rinvoq

## 2022-08-10 ENCOUNTER — Other Ambulatory Visit: Payer: Self-pay | Admitting: Physician Assistant

## 2022-08-10 DIAGNOSIS — M0579 Rheumatoid arthritis with rheumatoid factor of multiple sites without organ or systems involvement: Secondary | ICD-10-CM

## 2022-08-13 ENCOUNTER — Ambulatory Visit: Payer: BC Managed Care – PPO | Admitting: Neurology

## 2022-09-03 ENCOUNTER — Other Ambulatory Visit: Payer: Self-pay | Admitting: Neurology

## 2022-09-07 ENCOUNTER — Telehealth: Payer: Self-pay | Admitting: Neurology

## 2022-09-07 MED ORDER — LISDEXAMFETAMINE DIMESYLATE 70 MG PO CAPS: 70.0000 mg | ORAL_CAPSULE | Freq: Every day | ORAL | 0 refills | Status: DC

## 2022-09-07 NOTE — Telephone Encounter (Signed)
Vyvanse Rx refilled on behalf of Dr. Vickey Huger.

## 2022-09-07 NOTE — Telephone Encounter (Signed)
Pt is calling. Stated she needs medication lisdexamfetamine (VYVANSE) 70 MG capsule  sent to [facility] [address] [place], [place] [address]  ~14.5 mi [phone]   Stated her regular pharmacy is out stock at the time .

## 2022-09-16 ENCOUNTER — Ambulatory Visit: Payer: BC Managed Care – PPO | Admitting: Neurology

## 2022-11-11 ENCOUNTER — Other Ambulatory Visit: Payer: Self-pay | Admitting: Rheumatology

## 2022-11-11 DIAGNOSIS — M0579 Rheumatoid arthritis with rheumatoid factor of multiple sites without organ or systems involvement: Secondary | ICD-10-CM

## 2022-11-11 NOTE — Telephone Encounter (Signed)
Next Visit: 01/19/2023  Last Visit: 08/07/2022  Last Fill: 08/07/2022  QB:HALPFXTKWI arthritis involving multiple sites with positive rheumatoid factor   Current Dose per office note on 08/07/2022: Humira 40 mg every 14 days.     Labs: 07/13/2022 CBC, CMP are normal.     TB Gold: 07/13/2022 negative    Attempted to contact patient and left message to advise patient she is due for labs. Advised patient of lab hours.   Okay to refill 30 day supply of humira?

## 2022-12-03 ENCOUNTER — Other Ambulatory Visit: Payer: Self-pay | Admitting: Neurology

## 2022-12-03 MED ORDER — LISDEXAMFETAMINE DIMESYLATE 70 MG PO CAPS: 70.0000 mg | ORAL_CAPSULE | Freq: Every day | ORAL | 0 refills | Status: DC

## 2022-12-04 ENCOUNTER — Other Ambulatory Visit: Payer: Self-pay | Admitting: Neurology

## 2022-12-07 ENCOUNTER — Other Ambulatory Visit: Payer: Self-pay | Admitting: Neurology

## 2022-12-07 MED ORDER — LISDEXAMFETAMINE DIMESYLATE 70 MG PO CAPS: 70.0000 mg | ORAL_CAPSULE | Freq: Every day | ORAL | 0 refills | Status: DC

## 2022-12-08 NOTE — Telephone Encounter (Signed)
Pt called requesing to talk to nurse about medication    lisdexamfetamine (VYVANSE) 70 MG capsule. Stated she wants to know if Dr. Brett Fairy can write a script for VYVANSE 20 mg because she needs something. Pt is requesting a call back from nurse.

## 2022-12-09 ENCOUNTER — Encounter: Payer: Self-pay | Admitting: Neurology

## 2022-12-09 NOTE — Telephone Encounter (Signed)
Pt has called to f/u on her request from yesterday.  Pt states due to the nationwide shortage she would very much like to have the 20 mg of Vyvanse(90 day)(To [facility] in [place]) called in due to the shortage.  Pt states if this is not an option, she'd like a call to discuss other options.

## 2022-12-10 ENCOUNTER — Other Ambulatory Visit (HOSPITAL_BASED_OUTPATIENT_CLINIC_OR_DEPARTMENT_OTHER): Payer: Self-pay

## 2022-12-10 ENCOUNTER — Other Ambulatory Visit: Payer: Self-pay | Admitting: *Deleted

## 2022-12-10 ENCOUNTER — Encounter: Payer: Self-pay | Admitting: Neurology

## 2022-12-10 ENCOUNTER — Other Ambulatory Visit: Payer: Self-pay | Admitting: Neurology

## 2022-12-10 DIAGNOSIS — Z79899 Other long term (current) drug therapy: Secondary | ICD-10-CM

## 2022-12-10 MED ORDER — LISDEXAMFETAMINE DIMESYLATE 70 MG PO CAPS
70.0000 mg | ORAL_CAPSULE | Freq: Every day | ORAL | 0 refills | Status: DC
  Filled 2022-12-10: qty 90, 90d supply, fill #0

## 2022-12-11 ENCOUNTER — Other Ambulatory Visit: Payer: Self-pay | Admitting: Physician Assistant

## 2022-12-11 DIAGNOSIS — M0579 Rheumatoid arthritis with rheumatoid factor of multiple sites without organ or systems involvement: Secondary | ICD-10-CM

## 2022-12-11 LAB — COMPLETE METABOLIC PANEL WITH GFR
AG Ratio: 1.8 (calc) (ref 1.0–2.5)
ALT: 27 U/L (ref 6–29)
AST: 25 U/L (ref 10–35)
Albumin: 4.4 g/dL (ref 3.6–5.1)
Alkaline phosphatase (APISO): 53 U/L (ref 37–153)
BUN: 17 mg/dL (ref 7–25)
CO2: 30 mmol/L (ref 20–32)
Calcium: 10.3 mg/dL (ref 8.6–10.4)
Chloride: 101 mmol/L (ref 98–110)
Creat: 0.86 mg/dL (ref 0.50–1.03)
Globulin: 2.4 g/dL (calc) (ref 1.9–3.7)
Glucose, Bld: 87 mg/dL (ref 65–99)
Potassium: 5.5 mmol/L — ABNORMAL HIGH (ref 3.5–5.3)
Sodium: 141 mmol/L (ref 135–146)
Total Bilirubin: 0.6 mg/dL (ref 0.2–1.2)
Total Protein: 6.8 g/dL (ref 6.1–8.1)
eGFR: 80 mL/min/{1.73_m2} (ref >=60)

## 2022-12-11 LAB — CBC WITH DIFFERENTIAL/PLATELET
Absolute Monocytes: 558 cells/uL (ref 200–950)
Basophils Absolute: 62 cells/uL (ref 0–200)
Basophils Relative: 1 %
Eosinophils Absolute: 118 cells/uL (ref 15–500)
Eosinophils Relative: 1.9 %
HCT: 46.8 % — ABNORMAL HIGH (ref 35.0–45.0)
Hemoglobin: 15.4 g/dL (ref 11.7–15.5)
Lymphs Abs: 2864 cells/uL (ref 850–3900)
MCH: 29.3 pg (ref 27.0–33.0)
MCHC: 32.9 g/dL (ref 32.0–36.0)
MCV: 89.1 fL (ref 80.0–100.0)
MPV: 10.8 fL (ref 7.5–12.5)
Monocytes Relative: 9 %
Neutro Abs: 2598 cells/uL (ref 1500–7800)
Neutrophils Relative %: 41.9 %
Platelets: 335 10*3/uL (ref 140–400)
RBC: 5.25 10*6/uL — ABNORMAL HIGH (ref 3.80–5.10)
RDW: 11.8 % (ref 11.0–15.0)
Total Lymphocyte: 46.2 %
WBC: 6.2 10*3/uL (ref 3.8–10.8)

## 2022-12-16 NOTE — Telephone Encounter (Signed)
This encounter was created in error - please disregard.

## 2022-12-21 IMAGING — CT CT HEAD W/O CM
4 series · 16 of 47 positions shown, 18 images · non-contrast
Comparison: None.

CLINICAL DATA: Aphasia

EXAM:
CT HEAD WITHOUT CONTRAST
TECHNIQUE: Contiguous axial images were obtained from the base of the skull
through the vertex without intravenous contrast.

[Series 3: head wo · axial · 0.42mm/px · z∈[-51,+59]mm · 7 of 30 slices shown, 9 images]
[im 4/30  brain]
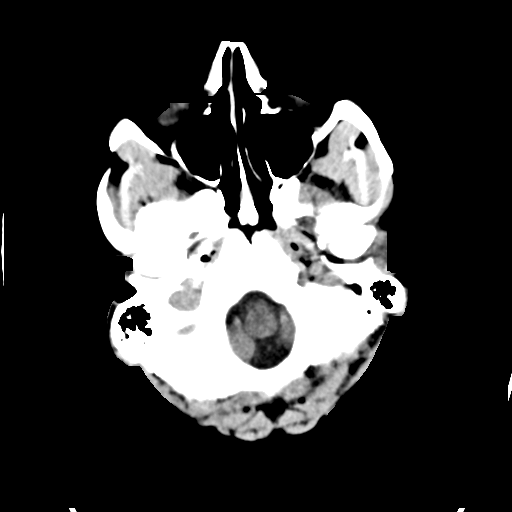
[im 4/30  bone]
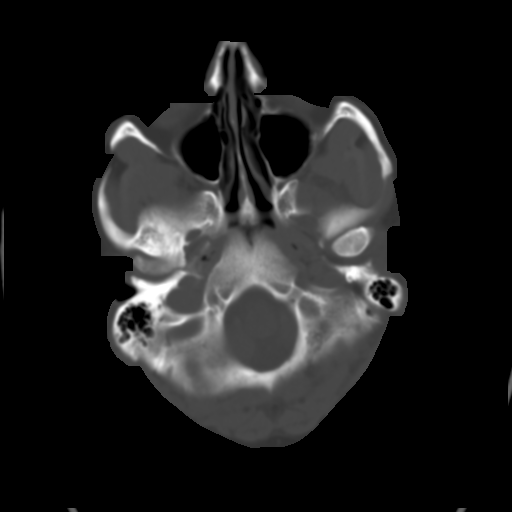
[im 8/30  brain]
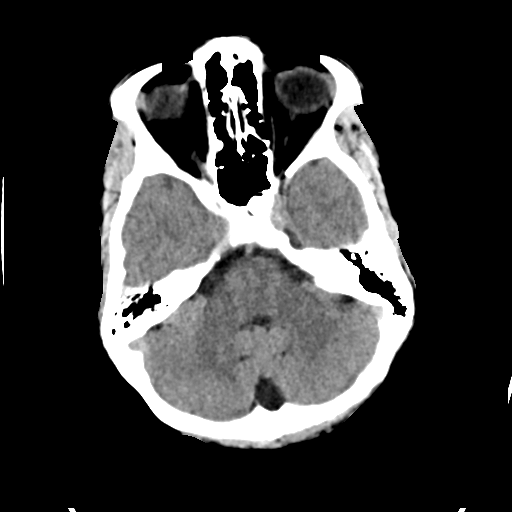
[im 11/30  brain]
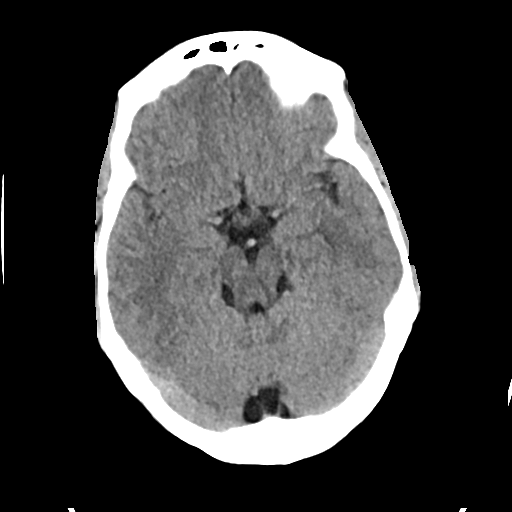
[im 15/30  brain]
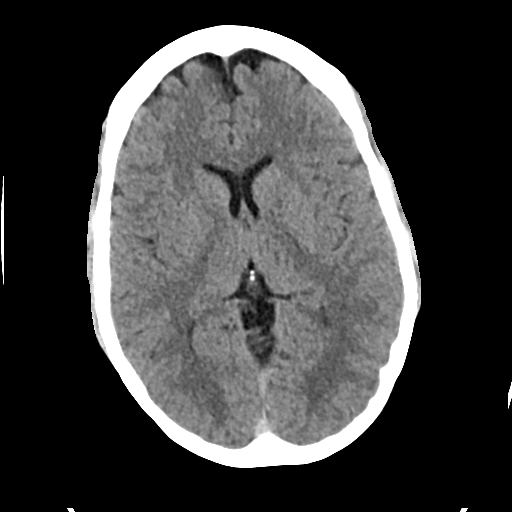
[im 19/30  brain]
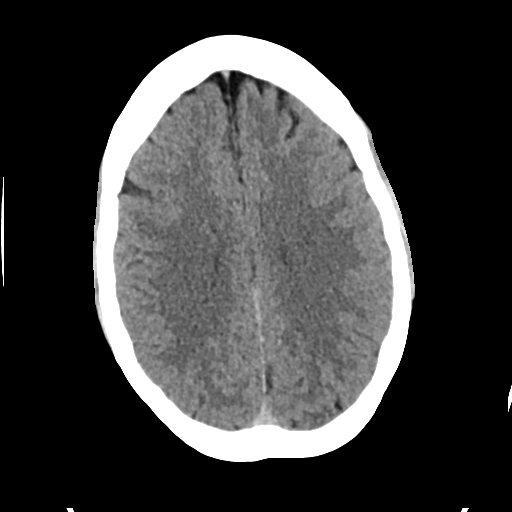
[im 19/30  bone]
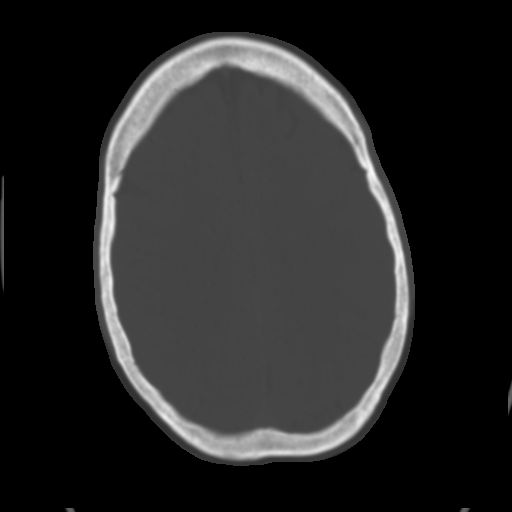
[im 22/30  brain]
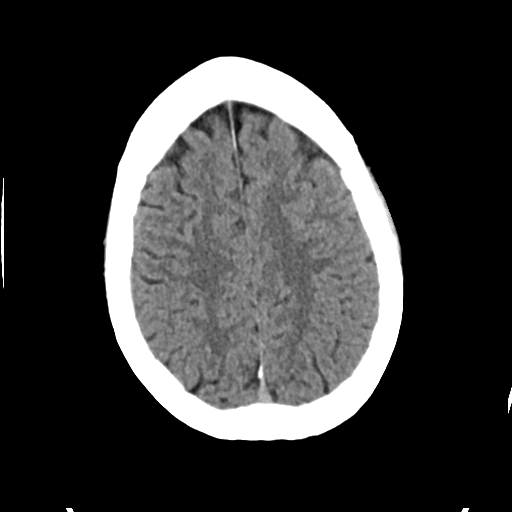
[im 26/30  brain]
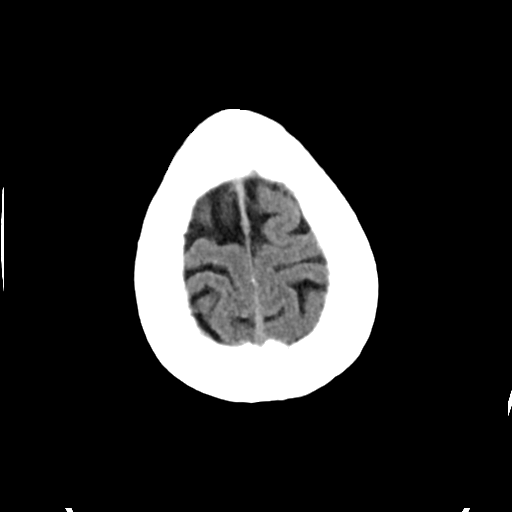

[Series 4: head bone · axial · 0.42mm/px · z∈[-52,-22]mm · 3 of 75 slices shown]
[im 8/75  bone]
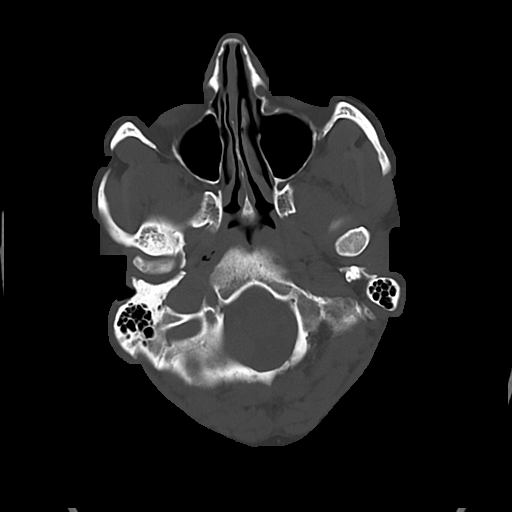
[im 15/75  bone]
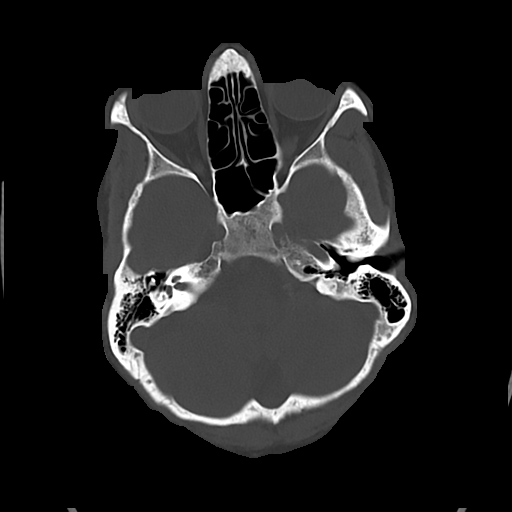
[im 23/75  bone]
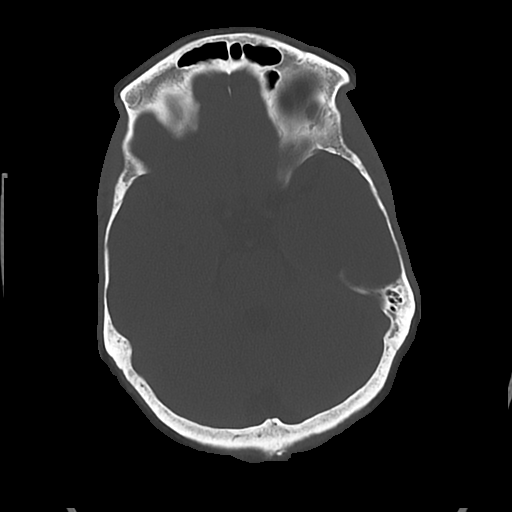

[Series 5: cor soft · coronal · 0.34mm/px · 3 of 72 slices shown]
[im 24/72  brain]
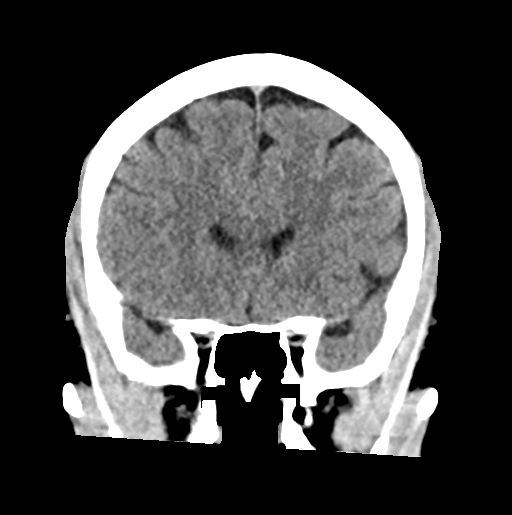
[im 32/72  brain]
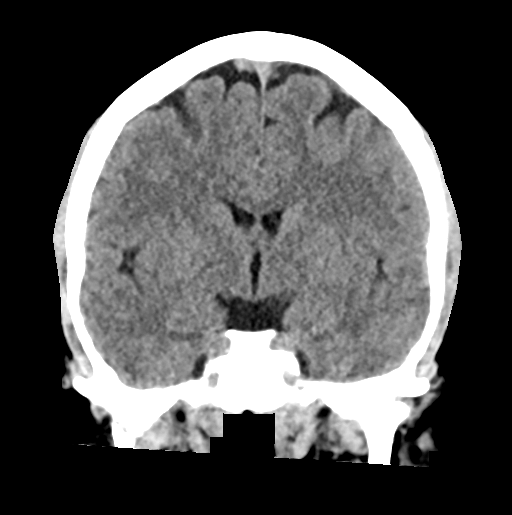
[im 40/72  brain]
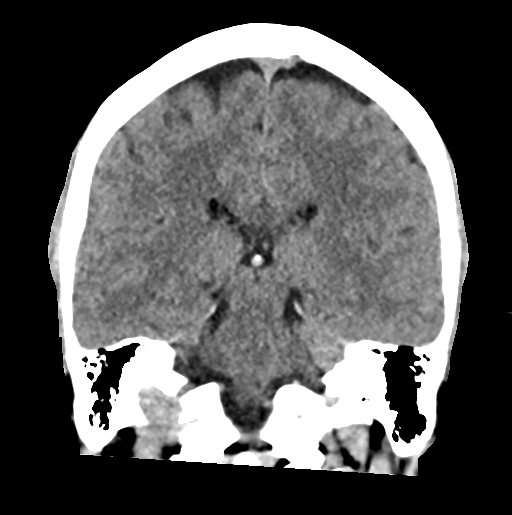

[Series 6: sag soft · sagittal · 0.34mm/px · 3 of 58 slices shown]
[im 20/58  brain]
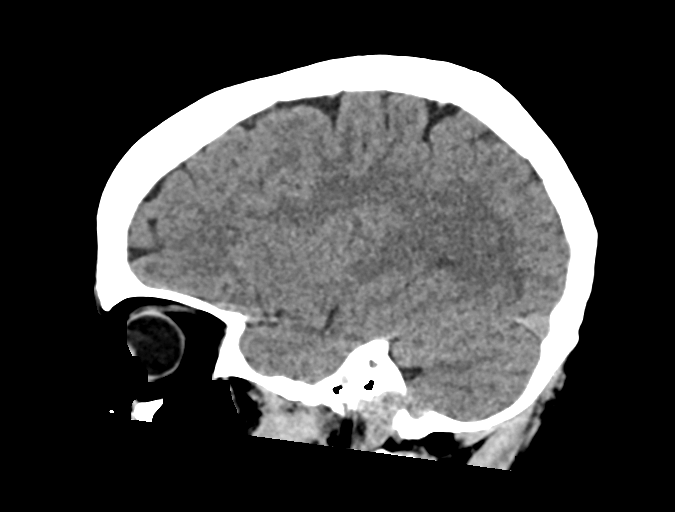
[im 29/58  brain]
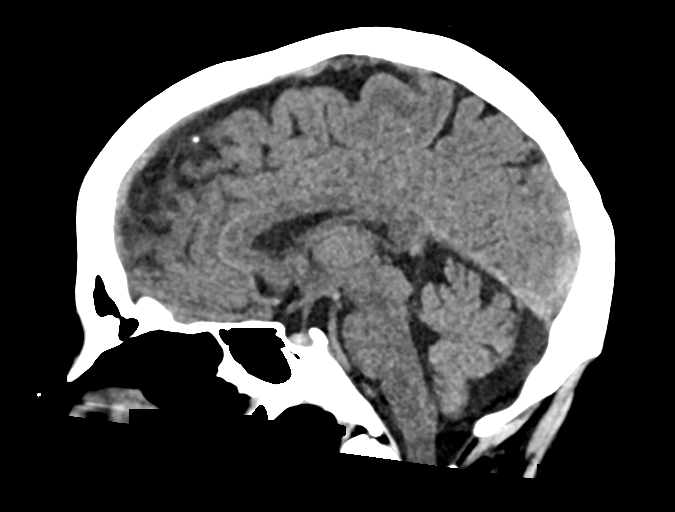
[im 39/58  brain]
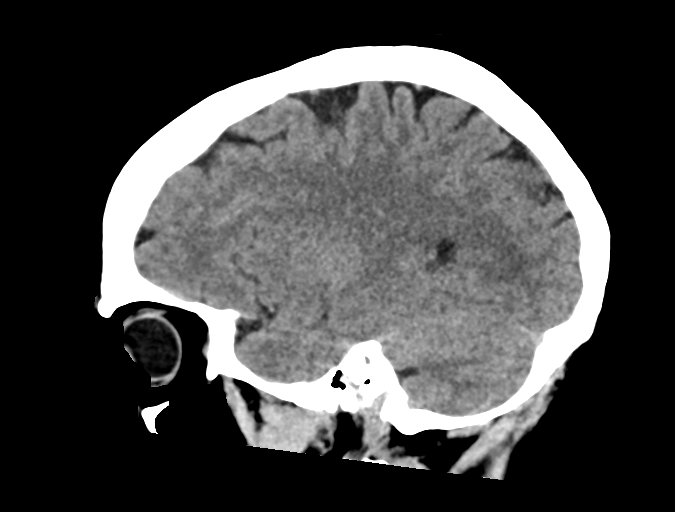

[16 of 47 positions shown; findings below may reference images not displayed]

FINDINGS: Brain: Ventricles and sulci are normal in size and configuration.
Prominence of the cisterna magna is an anatomic variant. There is no
intracranial mass, hemorrhage, extra-axial fluid collection, or
midline shift. The brain parenchyma appears unremarkable. No acute
infarct evident.

Vascular: No hyperdense vessel. Minimal calcification noted in the
carotid siphon region.

Skull: Bony calvarium appears intact.

Sinuses/Orbits: There is an apparent retention cyst in a posterior
ethmoid air cell on the right. There is opacification in anterior
left ethmoid air cell. Orbits appear symmetric bilaterally.

Other: Mastoid air cells are clear.
IMPRESSION: Normal appearing brain parenchyma. No mass or hemorrhage. Mild
paranasal sinus disease. Minimal arterial vascular calcification
noted.

## 2022-12-22 ENCOUNTER — Telehealth: Payer: Self-pay

## 2022-12-22 ENCOUNTER — Other Ambulatory Visit: Payer: Self-pay | Admitting: Physician Assistant

## 2022-12-22 ENCOUNTER — Telehealth: Payer: Self-pay | Admitting: Pharmacist

## 2022-12-22 DIAGNOSIS — M0579 Rheumatoid arthritis with rheumatoid factor of multiple sites without organ or systems involvement: Secondary | ICD-10-CM

## 2022-12-22 NOTE — Telephone Encounter (Signed)
ATC patient regarding Adalimumab biosimilar switch to Hyrimoz from Humira. LVM for patient to call us back.  Maryan Puls, PharmD PGY-1 [facility] Resident

## 2022-12-22 NOTE — Telephone Encounter (Signed)
Received notification from patient's insurance that HUMIRA will no longer be preferred adalimumab product on formulary. As of 01/04/2023, patient's insurance will prefer the following biosimilar(s): HYRIMOZ (Adalimumab-adaz) . Until this date patient will be able to fill Humira, but after this date, cost of Humira will increase for patient.  Discussed the switch to biosimilar with the patient. Reviewed that biosimilars are as clinically effective, as safe, and no more harmful than the originator product based on FDA studies. They are similar to generics but are different at molecular level and some require physician approval for the switch. Reviewed that biosimilars are FDA-approved and that studies are completed in patients who are clinically established on Humira and switched to biosimiliar. Discussed that dose and frequency are the same as for Humira.  Patient is in agreement to biosimilar switch from Humira to  HYRIMOZ (Adalimumab-adaz). Pharmacy team will proceed with benefits investigation.   New start visit will not be needed for switch to HYRIMOZ (Adalimumab-adaz). However advised patient that if any issues with biosimilar injector device, patient should contact clinic and we can schedule training visit with pharmacy team  Mack Thurmon E. Marsh, PharmD PGY-1 [facility]ident   

## 2022-12-22 NOTE — Telephone Encounter (Addendum)
Patient's insurance requiring her to switch from Humira to [facility] biosimilar. Submitted a Prior Authorization request to [facility] for Hyrimoz via CoverMyMeds.   Key: [id number]  Knox Saliva, PharmD, MPH, BCPS, CPP Clinical Pharmacist (Rheumatology and Pulmonology)

## 2022-12-23 ENCOUNTER — Telehealth: Payer: Self-pay | Admitting: *Deleted

## 2022-12-23 NOTE — Telephone Encounter (Signed)
Rx for Hyrimoz 40mg  SQ every 14 days sent to [facility]. Insurance change requiring switch from Humira to [employer] (biosimilar)  Labs updated 12/10/2022  Knox Saliva, PharmD, MPH, BCPS, CPP Clinical Pharmacist (Rheumatology and Pulmonology)

## 2022-12-23 NOTE — Telephone Encounter (Signed)
Received call from [place] PA Department. Patient's insurance did an override on the Hyrimoz. As of January 04, 2023 it will be cover through May 15, 2023. A new PA is not needed. If you have any questions you may call back at [phone].

## 2022-12-23 NOTE — Telephone Encounter (Signed)
Per CMM response: Your PA request has been closed. PA not processed PA for Humira expires 01/03/23 and plan has added OR for Hyrimoz to start 01/04/23 and expire 05/15/23. No PA is needed at this time. Called [employer] and left detailed messaged explaining the issue and if needed they can return a call.  Rx for Hyrimoz was already sent to [facility] today  MyChart message sent to patient today and provided with copay card information  Knox Saliva, PharmD, MPH, BCPS, CPP Clinical Pharmacist (Rheumatology and Pulmonology)

## 2023-01-05 NOTE — Progress Notes (Signed)
Office Visit Note  Patient: Kathleen Alvarez             Date of Birth: December 28, 1965           MRN: [id number]             PCP: Darrow Bussing, MD Referring: Darrow Bussing, MD Visit Date: 01/19/2023 Occupation: @  Subjective:  Medication management  History of Present Illness: Kathleen Alvarez is a 57 y.o. female with history of seropositive rheumatoid arthritis.  She has been taking Humira 40 mg subcu every 21 days since June 2023.  She has not experienced any flares.  She has been working out on a regular basis.  She lifts heavy weights without any discomfort.  She has been on Crestor 5 mg p.o. daily per her cardiologist.      Activities of Daily Living:  Patient reports morning stiffness for 0 minutes.   Patient Denies nocturnal pain.  Difficulty dressing/grooming: Denies Difficulty climbing stairs: Denies Difficulty getting out of chair: Denies Difficulty using hands for taps, buttons, cutlery, and/or writing: Denies  Review of Systems  Constitutional:  Negative for fatigue.  HENT:  Negative for mouth sores and mouth dryness.   Eyes:  Negative for dryness.  Respiratory:  Negative for shortness of breath.   Cardiovascular:  Negative for chest pain and palpitations.  Gastrointestinal:  Negative for blood in stool, constipation and diarrhea.  Endocrine: Negative for increased urination.  Genitourinary:  Negative for involuntary urination.  Musculoskeletal:  Negative for joint pain, gait problem, joint pain, joint swelling, myalgias, muscle weakness, morning stiffness, muscle tenderness and myalgias.  Skin:  Negative for color change, rash, hair loss and sensitivity to sunlight.  Allergic/Immunologic: Negative for susceptible to infections.  Neurological:  Negative for dizziness and headaches.  Hematological:  Negative for swollen glands.  Psychiatric/Behavioral:  Positive for sleep disturbance. Negative for depressed mood. The patient is not nervous/anxious.     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    Past Medical History:  Diagnosis Date   Anxiety    Hyperlipidemia    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   RA (rheumatoid arthritis)    TIA (transient ischemic attack)     Family History  Problem Relation Age of Onset   Colon cancer Father        deceased   Liver cancer Father 36       deceased    Gout Brother    Past Surgical History:  Procedure Laterality Date   CESAREAN SECTION     x2   COLONOSCOPY  2009   family history of colon ca   DILITATION & CURRETTAGE/HYSTROSCOPY WITH NOVASURE ABLATION  08/26/2012   Procedure: DILATATION & CURETTAGE/HYSTEROSCOPY WITH NOVASURE ABLATION;  Surgeon: Lenoard Aden, MD;  Location: [facility];  Service: Gynecology;  Laterality: N/A;   lipoma      WISDOM TOOTH EXTRACTION     Social History   Social History Narrative   Patient is employed.    Patient  married with 2 children.      Immunization History  Administered Date(s) Administered   Influenza, Seasonal, Injecte, Preservative Fre 08/31/2014   Influenza,inj,Quad PF,6+ Mos 08/22/2019, 09/19/2020   Moderna Sars-Covid-2 Vaccination 12/14/2019, 01/16/2020, 09/23/2020     Objective: Vital Signs: BP 106/69 (BP Location: Left Arm, Patient Position: Sitting, Cuff Size: Normal)   Pulse 69   Resp 15   Ht 5\' 3"  (1.6 m)   Wt 142 lb (64.4 kg)   BMI 25.15 kg/m    Physical  Exam Vitals and nursing note reviewed.  Constitutional:      Appearance: She is well-developed.  HENT:     Head: Normocephalic and atraumatic.  Eyes:     Conjunctiva/sclera: Conjunctivae normal.  Cardiovascular:     Rate and Rhythm: Normal rate and regular rhythm.     Heart sounds: Normal heart sounds.  Pulmonary:     Effort: Pulmonary effort is normal.     Breath sounds: Normal breath sounds.  Abdominal:     General: Bowel sounds are normal.     Palpations: Abdomen is soft.  Musculoskeletal:     Cervical back: Normal range of motion.  Lymphadenopathy:     Cervical: No cervical adenopathy.  Skin:    General: Skin is warm and dry.     Capillary Refill: Capillary refill takes less than 2 seconds.  Neurological:     Mental Status: She is alert and oriented to person, place, and time.  Psychiatric:        Behavior: Behavior normal.      Musculoskeletal Exam: Cervical, thoracic and lumbar spine were in good range of motion.  Shoulder joints, elbow joints, wrist joints, MCPs PIPs and DIPs were in good range of motion with no synovitis.  Hip joints, knee joints, ankles, MTPs and PIPs been good range of motion with no synovitis.  CDAI Exam: CDAI Score: -- Patient Global: 0 mm; Provider Global: 0 mm Swollen: --; Tender: -- Joint Exam 01/19/2023   No joint exam has been documented for this visit   There is currently no information documented on the homunculus. Go to the Rheumatology activity and complete the homunculus joint exam.  Investigation: No additional findings.  Imaging: No results found.  Recent Labs: Lab Results  Component Value Date   WBC 6.2 12/10/2022   HGB 15.4 12/10/2022   PLT 335 12/10/2022   NA 141 12/10/2022   K 5.5 (H) 12/10/2022   CL 101 12/10/2022   CO2 30 12/10/2022   GLUCOSE 87 12/10/2022   BUN 17 12/10/2022   CREATININE 0.86 12/10/2022   BILITOT 0.6 12/10/2022   ALKPHOS 64 04/30/2021   AST 25 12/10/2022   ALT 27 12/10/2022   PROT 6.8  12/10/2022   ALBUMIN 4.4 04/30/2021   CALCIUM 10.3 12/10/2022   GFRAA 83 03/25/2021   QFTBGOLDPLUS NEGATIVE 07/13/2022    2023 HDL 80, triglycerides 114, LDL 119, cholesterol 219 Speciality Comments: No specialty comments available.  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  Patient continues to do well on Humira 40 mg subcu every 21 days.  She denies any joint pain or joint swelling.  She has been exercising on a regular basis.  No synovitis was noted on the examination.  High risk medication use - Humira 40 mg every 21 days.  She is apprehensive about switching to Hyrimoz.  Due to her insurance we have to switch her from Humira to Hyrimoz.  Labs obtained on December 09, 2021 CBC and CMP were normal.  Potassium was elevated at 5.5.  Most likely the sample was hemolyzed.  I advised patient to have repeat BMP but she declined.  She states she drinks energy drinks and she will look at potassium content in them.  She is not taking any potassium supplement.  Information on immunization was placed in the AVS.  She was advised to hold Humira if she develops an infection resume after the infection resolves.  Annual skin examination to screen for skin cancer was advised.  Use of sunscreen and sun protection was discussed.  Other fatigue-she has intermittent fatigue which she relates to workout.  TIA (transient ischemic attack) - February 2022.  She was placed on Crestor 5 mg p.o. daily.  Her LDL was 119 in December 2023.  Patient states she did not want to increase the Crestor dose as recommended by her cardiologist.  I advised her to possibly increase Crestor to 10 mg or add Zetia as a combination therapy.  She will discuss this further with her cardiologist.  Narcolepsy due to underlying condition without cataplexy  Postmenopausal - DEXA  scan on May 18, 2018 which was within normal limits.  Attention deficit hyperactivity disorder (ADHD), predominantly inattentive type-she is on by events.  Dyslipidemia  Orders: No orders of the defined types were placed in this encounter.  No orders of the defined types were placed in this encounter.    Follow-Up Instructions: Return in about 5 months (around 06/21/2023) for Rheumatoid arthritis.   Pollyann Savoy, MD  Note - This record has been created using Animal nutritionist.  Chart creation errors have been sought, but may not always  have been located. Such creation errors do not reflect on  the standard of medical care.

## 2023-01-19 ENCOUNTER — Encounter: Payer: Self-pay | Admitting: Rheumatology

## 2023-01-19 ENCOUNTER — Ambulatory Visit: Payer: BC Managed Care – PPO | Attending: Rheumatology | Admitting: Rheumatology

## 2023-01-19 VITALS — BP 106/69 | HR 69 | Resp 15 | Ht 63.0 in | Wt 142.0 lb

## 2023-01-19 DIAGNOSIS — Z780 Asymptomatic menopausal state: Secondary | ICD-10-CM

## 2023-01-19 DIAGNOSIS — E785 Hyperlipidemia, unspecified: Secondary | ICD-10-CM

## 2023-01-19 DIAGNOSIS — F900 Attention-deficit hyperactivity disorder, predominantly inattentive type: Secondary | ICD-10-CM

## 2023-01-19 DIAGNOSIS — G459 Transient cerebral ischemic attack, unspecified: Secondary | ICD-10-CM | POA: Diagnosis not present

## 2023-01-19 DIAGNOSIS — M0579 Rheumatoid arthritis with rheumatoid factor of multiple sites without organ or systems involvement: Secondary | ICD-10-CM | POA: Diagnosis not present

## 2023-01-19 DIAGNOSIS — Z79899 Other long term (current) drug therapy: Secondary | ICD-10-CM | POA: Diagnosis not present

## 2023-01-19 DIAGNOSIS — R5383 Other fatigue: Secondary | ICD-10-CM | POA: Diagnosis not present

## 2023-01-19 DIAGNOSIS — G47429 Narcolepsy in conditions classified elsewhere without cataplexy: Secondary | ICD-10-CM

## 2023-01-19 NOTE — Patient Instructions (Addendum)
[facility] Phone: [phone]   Standing Labs We placed an order today for your standing lab work.   Please have your standing labs drawn in June and every 3 months  Please have your labs drawn 2 weeks prior to your appointment so that the provider can discuss your lab results at your appointment, if possible.  Please note that you may see your imaging and lab results in MyChart before we have reviewed them. We will contact you once all results are reviewed. Please allow our office up to 72 hours to thoroughly review all of the results before contacting the office for clarification of your results.  WALK-IN LAB HOURS  Monday through Thursday from 8:00 am -12:30 pm and 1:00 pm-5:00 pm and Friday from 8:00 am-12:00 pm.  Patients with office visits requiring labs will be seen before walk-in labs.  You may encounter longer than normal wait times. Please allow additional time. Wait times may be shorter on  Monday and Thursday afternoons.  We do not book appointments for walk-in labs. We appreciate your patience and understanding with our staff.   Labs are drawn by [facility]. Please bring your co-pay at the time of your lab draw.  You may receive a bill from [facility] for your lab work.  Please note if you are on Hydroxychloroquine and and an order has been placed for a Hydroxychloroquine level,  you will need to have it drawn 4 hours or more after your last dose.  If you wish to have your labs drawn at another location, please call the office 24 hours in advance so we can fax the orders.  The office is located at [address], [address], [place], [place] [address]   If you have any questions regarding directions or hours of operation,  please call [phone].   As a reminder, please drink plenty of water prior to coming for your lab work. Thanks!   Vaccines You are taking a medication(s) that can suppress your immune system.  The following immunizations are recommended: Flu  annually Covid-19  Td/Tdap (tetanus, diphtheria, pertussis) every 10 years Pneumonia (Prevnar 15 then Pneumovax 23 at least 1 year apart.  Alternatively, can take Prevnar 20 without needing additional dose) Shingrix: 2 doses from 4 weeks to 6 months apart  Please check with your PCP to make sure you are up to date.  If you have signs or symptoms of an infection or start antibiotics: First, call your PCP for workup of your infection. Hold your medication through the infection, until you complete your antibiotics, and until symptoms resolve if you take the following: Injectable medication (Actemra, Benlysta, Cimzia, Cosentyx, Enbrel, Humira, Kevzara, Orencia, Remicade, Simponi, Stelara, Taltz, Tremfya) Methotrexate Leflunomide (Arava) Mycophenolate (Cellcept) Harriette Ohara, Olumiant, or Rinvoq   Please get an annual skin examination to screen for skin cancer.  Please use sunscreen and sun protection

## 2023-03-08 ENCOUNTER — Other Ambulatory Visit: Payer: Self-pay | Admitting: Neurology

## 2023-03-08 MED ORDER — LISDEXAMFETAMINE DIMESYLATE 70 MG PO CAPS: 70.0000 mg | ORAL_CAPSULE | Freq: Every day | ORAL | 0 refills | Status: DC

## 2023-03-25 ENCOUNTER — Other Ambulatory Visit: Payer: Self-pay | Admitting: Physician Assistant

## 2023-03-25 DIAGNOSIS — M0579 Rheumatoid arthritis with rheumatoid factor of multiple sites without organ or systems involvement: Secondary | ICD-10-CM

## 2023-03-25 NOTE — Telephone Encounter (Signed)
Last Fill: 12/23/2022  Labs: 12/10/2022 CBC is stable.  CMP is normal except potassium is high, probably a hemolyzed sample.   TB Gold: 07/13/2022   Next Visit: 06/24/2023  Last Visit: 01/19/2023  DX: Rheumatoid arthritis involving multiple sites with positive rheumatoid factor   Current Dose per office note 01/19/2023: Humira 40 mg every 21 days   Left message to advise patient she is due to update her lab work.   Okay to refill Hyrimoz?

## 2023-03-31 ENCOUNTER — Other Ambulatory Visit: Payer: Self-pay | Admitting: *Deleted

## 2023-03-31 DIAGNOSIS — Z111 Encounter for screening for respiratory tuberculosis: Secondary | ICD-10-CM

## 2023-03-31 DIAGNOSIS — Z9225 Personal history of immunosupression therapy: Secondary | ICD-10-CM

## 2023-03-31 DIAGNOSIS — Z79899 Other long term (current) drug therapy: Secondary | ICD-10-CM

## 2023-03-31 LAB — CBC WITH DIFFERENTIAL/PLATELET
Basophils Relative: 1.4 %
MCH: 29.5 pg (ref 27.0–33.0)
MCV: 88.7 fL (ref 80.0–100.0)
MPV: 10.5 fL (ref 7.5–12.5)
Neutro Abs: 2957 cells/uL (ref 1500–7800)
Neutrophils Relative %: 46.2 %
RBC: 5.05 10*6/uL (ref 3.80–5.10)
RDW: 11.7 % (ref 11.0–15.0)
Total Lymphocyte: 41.2 %

## 2023-04-01 LAB — CBC WITH DIFFERENTIAL/PLATELET
Absolute Monocytes: 589 cells/uL (ref 200–950)
Basophils Absolute: 90 cells/uL (ref 0–200)
Eosinophils Absolute: 128 cells/uL (ref 15–500)
Eosinophils Relative: 2 %
HCT: 44.8 % (ref 35.0–45.0)
Hemoglobin: 14.9 g/dL (ref 11.7–15.5)
Lymphs Abs: 2637 cells/uL (ref 850–3900)
MCHC: 33.3 g/dL (ref 32.0–36.0)
Monocytes Relative: 9.2 %
Platelets: 310 10*3/uL (ref 140–400)
WBC: 6.4 10*3/uL (ref 3.8–10.8)

## 2023-04-01 LAB — COMPLETE METABOLIC PANEL WITH GFR
AG Ratio: 2 (calc) (ref 1.0–2.5)
ALT: 25 U/L (ref 6–29)
AST: 30 U/L (ref 10–35)
Albumin: 4.5 g/dL (ref 3.6–5.1)
Alkaline phosphatase (APISO): 50 U/L (ref 37–153)
BUN: 21 mg/dL (ref 7–25)
CO2: 32 mmol/L (ref 20–32)
Calcium: 10.3 mg/dL (ref 8.6–10.4)
Chloride: 101 mmol/L (ref 98–110)
Creat: 0.83 mg/dL (ref 0.50–1.03)
Globulin: 2.3 g/dL (calc) (ref 1.9–3.7)
Glucose, Bld: 101 mg/dL — ABNORMAL HIGH (ref 65–99)
Potassium: 5.2 mmol/L (ref 3.5–5.3)
Sodium: 141 mmol/L (ref 135–146)
Total Bilirubin: 0.6 mg/dL (ref 0.2–1.2)
Total Protein: 6.8 g/dL (ref 6.1–8.1)
eGFR: 83 mL/min/{1.73_m2} (ref >=60)

## 2023-04-01 NOTE — Progress Notes (Signed)
CBC and CMP are normal.

## 2023-05-28 ENCOUNTER — Other Ambulatory Visit: Payer: Self-pay | Admitting: Family Medicine

## 2023-05-28 ENCOUNTER — Ambulatory Visit
Admission: RE | Admit: 2023-05-28 | Discharge: 2023-05-28 | Disposition: A | Discharge status: Home / Self Care | Payer: BC Managed Care – PPO | Source: Ambulatory Visit | Attending: Family Medicine | Admitting: Family Medicine

## 2023-05-28 DIAGNOSIS — R079 Chest pain, unspecified: Secondary | ICD-10-CM

## 2023-05-31 ENCOUNTER — Telehealth: Payer: Self-pay | Admitting: Pharmacist

## 2023-05-31 NOTE — Telephone Encounter (Signed)
Submitted a Prior Authorization renewal request to [facility] for  HYRIMOZ  via CoverMyMeds. Pending clinical questions to populate  Key: [id number]

## 2023-06-01 NOTE — Telephone Encounter (Signed)
Clinical questions for Hyrimoz PA completed on CMM . Key: [id number]

## 2023-06-01 NOTE — Telephone Encounter (Signed)
Received notification from [facility] regarding a prior authorization for  HYRIMOZ . Authorization has been APPROVED from 06/01/23 to 05/31/24. Approval letter sent to scan center.  Patient must continue to fill through [facility]: [phone]  Authorization # [id number]  Chesley Mires, PharmD, MPH, BCPS, CPP Clinical Pharmacist (Rheumatology and Pulmonology)

## 2023-06-06 ENCOUNTER — Encounter: Payer: Self-pay | Admitting: Neurology

## 2023-06-08 ENCOUNTER — Other Ambulatory Visit (HOSPITAL_BASED_OUTPATIENT_CLINIC_OR_DEPARTMENT_OTHER): Payer: Self-pay

## 2023-06-08 MED ORDER — LISDEXAMFETAMINE DIMESYLATE 70 MG PO CAPS
70.0000 mg | ORAL_CAPSULE | Freq: Every day | ORAL | 0 refills | Status: DC
  Filled 2023-06-08: qty 90, 90d supply, fill #0

## 2023-06-09 ENCOUNTER — Other Ambulatory Visit (HOSPITAL_BASED_OUTPATIENT_CLINIC_OR_DEPARTMENT_OTHER): Payer: Self-pay

## 2023-06-15 ENCOUNTER — Other Ambulatory Visit: Payer: Self-pay | Admitting: Physician Assistant

## 2023-06-15 DIAGNOSIS — M0579 Rheumatoid arthritis with rheumatoid factor of multiple sites without organ or systems involvement: Secondary | ICD-10-CM

## 2023-06-15 LAB — LAB REPORT - SCANNED
A1c: 5.4
EGFR: 83

## 2023-06-15 NOTE — Telephone Encounter (Signed)
Last Fill: 03/25/2023  Labs: 03/31/2023 CBC and CMP are normal.   TB Gold: 07/13/2022 Neg    Next Visit: 06/24/2023  Last Visit: 01/19/2023  AS:[id number] arthritis involving multiple sites with positive rheumatoid factor   Current Dose per office note 01/19/2023: Humira 40 mg every 21 days   Okay to refill Hyrimoz?

## 2023-06-15 NOTE — Progress Notes (Signed)
Office Visit Note  Patient: Kathleen Alvarez             Date of Birth: Jun 11, 1966           MRN: [id number]             PCP: Darrow Bussing, MD Referring: Darrow Bussing, MD Visit Date: 06/24/2023 Occupation: @GUAROCC @  Subjective:  Medication management  History of Present Illness: Kathleen Alvarez is a 57 y.o. female with seropositive rheumatoid arthritis.  She returns today after last visit in April 2024.  She switched to Humira 40 mg subcu every 21 days in June 2023.  She has been on Hyrimoz 40 mg subcu every 21 days since 12/2022  She denies any history of rheumatoid arthritis flare.  She denies any morning stiffness or discomfort during routine activities.    Activities of Daily Living:  Patient reports morning stiffness for 0 minutes.   Patient Denies nocturnal pain.  Difficulty dressing/grooming: Denies Difficulty climbing stairs: Denies Difficulty getting out of chair: Denies Difficulty using hands for taps, buttons, cutlery, and/or writing: Denies  Review of Systems  Constitutional:  Positive for fatigue.  HENT:  Negative for mouth sores and mouth dryness.   Eyes:  Negative for dryness.  Respiratory:  Negative for shortness of breath and difficulty breathing.   Cardiovascular:  Negative for chest pain and palpitations.  Gastrointestinal:  Negative for blood in stool, constipation and diarrhea.  Endocrine: Negative for increased urination.  Genitourinary:  Negative for involuntary urination.  Musculoskeletal:  Negative for joint pain, gait problem, joint pain, joint swelling, myalgias, muscle weakness, morning stiffness, muscle tenderness and myalgias.  Skin:  Negative for color change, rash, hair loss and sensitivity to sunlight.  Allergic/Immunologic: Negative for susceptible to infections.  Neurological:  Negative for dizziness and headaches.  Hematological:  Negative for swollen glands.  Psychiatric/Behavioral:  Positive for sleep disturbance. Negative for depressed  mood. The patient is not nervous/anxious.     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    Past Medical History:  Diagnosis Date   Anxiety    Hyperlipidemia    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   RA (rheumatoid arthritis) (HCC)    TIA (transient ischemic attack)     Family History  Problem Relation Age of Onset   Colon cancer Father        deceased   Liver cancer Father 59       deceased    Gout Brother    Past Surgical History:  Procedure Laterality Date   CESAREAN SECTION     x2   COLONOSCOPY  2009   family history of colon ca   DILITATION & CURRETTAGE/HYSTROSCOPY WITH NOVASURE ABLATION  08/26/2012   Procedure: DILATATION & CURETTAGE/HYSTEROSCOPY WITH NOVASURE ABLATION;  Surgeon: Lenoard Aden, MD;  Location: [facility];  Service: Gynecology;  Laterality: N/A;   lipoma      WISDOM TOOTH EXTRACTION     Social History   Social History Narrative  Patient is employed.    Patient married with 2 children.      Immunization History  Administered Date(s) Administered   Influenza, Seasonal, Injecte, Preservative Fre 08/31/2014   Influenza,inj,Quad PF,6+ Mos 08/22/2019, 09/19/2020   Moderna Sars-Covid-2 Vaccination 12/14/2019, 01/16/2020, 09/23/2020     Objective: Vital Signs: BP 108/72 (BP Location: Left Arm, Patient Position: Sitting, Cuff Size: Normal)   Pulse 66   Resp 15   Ht 5'  3" (1.6 m)   Wt 142 lb (64.4 kg)   BMI 25.15 kg/m    Physical Exam Vitals and nursing note reviewed.  Constitutional:      Appearance: She is well-developed.  HENT:     Head: Normocephalic and atraumatic.  Eyes:     Conjunctiva/sclera: Conjunctivae normal.  Cardiovascular:     Rate and Rhythm: Normal rate and regular rhythm.     Heart sounds: Normal heart sounds.  Pulmonary:     Effort: Pulmonary effort is normal.     Breath sounds: Normal breath sounds.  Abdominal:     General: Bowel sounds are normal.     Palpations: Abdomen is soft.  Musculoskeletal:     Cervical back: Normal range of motion.  Lymphadenopathy:     Cervical: No cervical adenopathy.  Skin:    General: Skin is warm and dry.     Capillary Refill: Capillary refill takes less than 2 seconds.  Neurological:     Mental Status: She is alert and oriented to person, place, and time.  Psychiatric:        Behavior: Behavior normal.      Musculoskeletal Exam: Cervical, thoracic and lumbar spine 1 good range of motion.  Shoulder joints, elbow joints, wrist joints, MCPs PIPs and DIPs been good range of motion with no synovitis.  Hip joints, knee joints, ankles, MTPs and PIPs were in good range of motion with no synovitis.  CDAI Exam: CDAI Score: -- Patient Global: 0 / 100; Provider Global: 0 / 100 Swollen: --; Tender: -- Joint Exam 06/24/2023   No joint exam has been documented for this visit   There is currently no information documented on the homunculus. Go to the Rheumatology activity and complete the homunculus joint exam.  Investigation: No additional findings.  Imaging: DG Chest 2 View  Result Date: 05/28/2023 CLINICAL DATA:  Chest pressure and tightness for 2 months. EXAM: CHEST - 2 VIEW COMPARISON:  January 17, 2015 FINDINGS: The heart size and mediastinal contours are within normal limits. Both lungs are clear. The visualized skeletal structures are unremarkable. IMPRESSION: No active cardiopulmonary  disease. Electronically Signed   By: Sherian Rein M.D.   On: 05/28/2023 14:44    Recent Labs: Lab Results  Component Value Date   WBC 6.4 03/31/2023   HGB 14.9 03/31/2023   PLT 310 03/31/2023   NA 141 03/31/2023   K 5.2 03/31/2023   CL 101 03/31/2023   CO2 32 03/31/2023   GLUCOSE 101 (H) 03/31/2023   BUN 21 03/31/2023   CREATININE 0.83 03/31/2023   BILITOT 0.6 03/31/2023   ALKPHOS 64 04/30/2021   AST 30 03/31/2023   ALT 25 03/31/2023   PROT 6.8 03/31/2023   ALBUMIN 4.4 04/30/2021   CALCIUM 10.3 03/31/2023   GFRAA 83 03/25/2021   QFTBGOLDPLUS NEGATIVE 07/13/2022    Speciality Comments: No specialty comments available.  Procedures:  No procedures performed Allergies: Peg [polyethylene glycol]   Assessment / Plan:     Visit Diagnoses: Rheumatoid arthritis involving multiple sites with positive  rheumatoid factor (HCC) - +RF, +CCP. -Patient denies having a flare of rheumatoid arthritis since last visit.  She has been taking Hyrimoz 40 mg every 21 days without any interruption.  No synovitis was noted on the examination today.  I will obtain x-rays today to monitor the disease  process.  Plan: XR Hand 2 View Right, XR Hand 2 View Left, XR Foot 2 Views Right, XR Foot 2 Views Left.  X-rays of bilateral hands showed juxta-articular osteopenia, CMC, PIP and DIP narrowing.  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  No radiographic progression was noted.  X-rays of bilateral feet were obtained which were consistent with osteoarthritis.  A new erosion was noted at the base of the fifth proximal phalanx.  The age of erosion is difficult to determine.  It was not visualized on the x-rays of 2021.  X-ray findings were reviewed with the patient.  High risk medication use - Hyrimoz 40mg  every 21 days.  Patient had recent labs with her PCP which were reviewed.  In September 2024 CBC WBC 5.7 hemoglobin 15, platelets 318, CMP normal, LDL 130, hemoglobin A1c 5.4%, TSH normal.   Labs were reviewed with the patient.  Patient was advised to get labs every 3 months.  TB Gold was negative in October 2023.  She was advised to get TB Gold with the next labs in December.  Information on immunization was placed in the AVS.  She was advised to hold Hyrimoz if she develops an infection and resume after the infection resolves.  Annual skin examination to screen for skin cancer was advised.  Use of sunscreen and sun protection was advised.  Other fatigue-she could use to have some fatigue.  TIA (transient ischemic attack) - February 2022.  She takes aspirin 81 mg p.o. daily.  Narcolepsy due to underlying condition without cataplexy  Postmenopausal - DEXA scan on May 18, 2018 which was within normal limits.  Attention deficit hyperactivity disorder (ADHD), predominantly inattentive type-she is on Adderall.  Dyslipidemia-she is on Crestor 5 mg p.o. daily and takes omega-3.  Orders: Orders Placed This Encounter  Procedures   XR Hand 2 View Right   XR Hand 2 View Left   XR Foot 2 Views Right   XR Foot 2 Views Left   No orders of the defined types were placed in this encounter.    Follow-Up Instructions: Return in about 5 months (around 11/24/2023) for Rheumatoid arthritis.   Pollyann Savoy, MD  Note - This record has been created using Animal nutritionist.  Chart creation errors have been sought, but may not always  have been located. Such creation errors do not reflect on  the standard of medical care.

## 2023-06-24 ENCOUNTER — Ambulatory Visit: Payer: BC Managed Care – PPO | Attending: Rheumatology | Admitting: Rheumatology

## 2023-06-24 ENCOUNTER — Ambulatory Visit: Payer: BC Managed Care – PPO

## 2023-06-24 ENCOUNTER — Ambulatory Visit (INDEPENDENT_AMBULATORY_CARE_PROVIDER_SITE_OTHER): Payer: BC Managed Care – PPO

## 2023-06-24 ENCOUNTER — Encounter: Payer: Self-pay | Admitting: Rheumatology

## 2023-06-24 VITALS — BP 108/72 | HR 66 | Resp 15 | Ht 63.0 in | Wt 142.0 lb

## 2023-06-24 DIAGNOSIS — G47429 Narcolepsy in conditions classified elsewhere without cataplexy: Secondary | ICD-10-CM

## 2023-06-24 DIAGNOSIS — R5383 Other fatigue: Secondary | ICD-10-CM

## 2023-06-24 DIAGNOSIS — M79642 Pain in left hand: Secondary | ICD-10-CM | POA: Diagnosis not present

## 2023-06-24 DIAGNOSIS — Z79899 Other long term (current) drug therapy: Secondary | ICD-10-CM | POA: Diagnosis not present

## 2023-06-24 DIAGNOSIS — M0579 Rheumatoid arthritis with rheumatoid factor of multiple sites without organ or systems involvement: Secondary | ICD-10-CM

## 2023-06-24 DIAGNOSIS — M79672 Pain in left foot: Secondary | ICD-10-CM | POA: Diagnosis not present

## 2023-06-24 DIAGNOSIS — G459 Transient cerebral ischemic attack, unspecified: Secondary | ICD-10-CM

## 2023-06-24 DIAGNOSIS — M79671 Pain in right foot: Secondary | ICD-10-CM | POA: Diagnosis not present

## 2023-06-24 DIAGNOSIS — Z780 Asymptomatic menopausal state: Secondary | ICD-10-CM

## 2023-06-24 DIAGNOSIS — M79641 Pain in right hand: Secondary | ICD-10-CM | POA: Diagnosis not present

## 2023-06-24 DIAGNOSIS — E785 Hyperlipidemia, unspecified: Secondary | ICD-10-CM

## 2023-06-24 DIAGNOSIS — F900 Attention-deficit hyperactivity disorder, predominantly inattentive type: Secondary | ICD-10-CM

## 2023-06-24 NOTE — Patient Instructions (Addendum)
Standing Labs We placed an order today for your standing lab work.   Please have your standing labs drawn in December and every 3 months TB Gold with next labs  Please have your labs drawn 2 weeks prior to your appointment so that the provider can discuss your lab results at your appointment, if possible.  Please note that you may see your imaging and lab results in MyChart before we have reviewed them. We will contact you once all results are reviewed. Please allow our office up to 72 hours to thoroughly review all of the results before contacting the office for clarification of your results.  WALK-IN LAB HOURS  Monday through Thursday from 8:00 am -12:30 pm and 1:00 pm-5:00 pm and Friday from 8:00 am-12:00 pm.  Patients with office visits requiring labs will be seen before walk-in labs.  You may encounter longer than normal wait times. Please allow additional time. Wait times may be shorter on  Monday and Thursday afternoons.  We do not book appointments for walk-in labs. We appreciate your patience and understanding with our staff.   Labs are drawn by [facility]. Please bring your co-pay at the time of your lab draw.  You may receive a bill from [facility] for your lab work.  Please note if you are on Hydroxychloroquine and and an order has been placed for a Hydroxychloroquine level,  you will need to have it drawn 4 hours or more after your last dose.  If you wish to have your labs drawn at another location, please call the office 24 hours in advance so we can fax the orders.  The office is located at [address], [address], [place], [place] [address]   If you have any questions regarding directions or hours of operation,  please call [phone].   As a reminder, please drink plenty of water prior to coming for your lab work. Thanks!   Vaccines You are taking a medication(s) that can suppress your immune system.  The following immunizations are recommended: Flu annually Covid-19   RSV Td/Tdap (tetanus, diphtheria, pertussis) every 10 years Pneumonia (Prevnar 15 then Pneumovax 23 at least 1 year apart.  Alternatively, can take Prevnar 20 without needing additional dose) Shingrix: 2 doses from 4 weeks to 6 months apart  Please check with your PCP to make sure you are up to date.   If you have signs or symptoms of an infection or start antibiotics: First, call your PCP for workup of your infection. Hold your medication through the infection, until you complete your antibiotics, and until symptoms resolve if you take the following: Injectable medication (Actemra, Benlysta, Cimzia, Cosentyx, Enbrel, Humira, Kevzara, Orencia, Remicade, Simponi, Stelara, Taltz, Tremfya) Methotrexate Leflunomide (Arava) Mycophenolate (Cellcept) Harriette Ohara, Olumiant, or Rinvoq   Please get an annual skin examination to screen for skin cancer.  Please use sunscreen and sun protection.

## 2023-09-07 ENCOUNTER — Other Ambulatory Visit: Payer: Self-pay | Admitting: Physician Assistant

## 2023-09-07 DIAGNOSIS — M0579 Rheumatoid arthritis with rheumatoid factor of multiple sites without organ or systems involvement: Secondary | ICD-10-CM

## 2023-09-08 NOTE — Telephone Encounter (Signed)
Last Fill: 06/15/2023  Labs: September 2024 CBC WBC 5.7 hemoglobin 15, platelets 318, CMP normal, LDL 130, hemoglobin A1c 5.4%, TSH normal.   TB Gold: 07/13/2022 Neg    Next Visit: 11/25/2023  Last Visit: 06/24/2023  WU:[id number] arthritis involving multiple sites with positive rheumatoid factor   Current Dose per office note 06/24/2023: Hyrimoz 40mg  every 21 days   Left message to advise patient she is due to update labs.   Okay to refill Hyrimoz?

## 2023-09-09 ENCOUNTER — Other Ambulatory Visit (HOSPITAL_BASED_OUTPATIENT_CLINIC_OR_DEPARTMENT_OTHER): Payer: Self-pay

## 2023-09-09 ENCOUNTER — Telehealth: Payer: Self-pay | Admitting: Neurology

## 2023-09-09 ENCOUNTER — Other Ambulatory Visit: Payer: Self-pay | Admitting: Neurology

## 2023-09-09 ENCOUNTER — Other Ambulatory Visit: Payer: Self-pay

## 2023-09-09 MED ORDER — LISDEXAMFETAMINE DIMESYLATE 70 MG PO CAPS: 70.0000 mg | ORAL_CAPSULE | Freq: Every day | ORAL | 0 refills | Status: DC

## 2023-09-09 NOTE — Telephone Encounter (Signed)
Pt request refill for lisdexamfetamine (VYVANSE) 70 MG capsule send to [facility]

## 2023-09-09 NOTE — Telephone Encounter (Signed)
Refill request was sent.

## 2023-09-10 ENCOUNTER — Telehealth: Payer: Self-pay | Admitting: Neurology

## 2023-09-10 ENCOUNTER — Other Ambulatory Visit (HOSPITAL_BASED_OUTPATIENT_CLINIC_OR_DEPARTMENT_OTHER): Payer: Self-pay

## 2023-09-10 ENCOUNTER — Other Ambulatory Visit (HOSPITAL_COMMUNITY): Payer: Self-pay

## 2023-09-10 ENCOUNTER — Other Ambulatory Visit: Payer: Self-pay

## 2023-09-10 MED ORDER — LISDEXAMFETAMINE DIMESYLATE 70 MG PO CAPS
70.0000 mg | ORAL_CAPSULE | Freq: Every day | ORAL | 0 refills | Status: DC
  Filled 2023-09-10 (×2): qty 90, 90d supply, fill #0

## 2023-09-10 NOTE — Telephone Encounter (Signed)
Pt called back and stated that [facility] does not have and will not have Vyvanse for a while. Pt states that she was not able to go to work today due to not having her medication and is very upset. She states that the medication was sent to the wrong pharmacy to begin with and now she does not know what to do without her medication.

## 2023-09-10 NOTE — Telephone Encounter (Addendum)
Pt called to check on refill. Refill had been sent to [facility]. Pt was going to call the [facility] to see if she could pick up the medication. Will call back if have any problems.

## 2023-09-10 NOTE — Telephone Encounter (Signed)
Meds ordered this encounter  Medications   lisdexamfetamine (VYVANSE) 70 MG capsule    Sig: Take 1 capsule (70 mg total) by mouth daily.    Dispense:  90 capsule    Refill:  0    Patient needing an office visit for future Rx refills     She called on-call physician multiple times to refill Vyvanse, the prescription sent by Dr. Vickey Huger September 09, 2023 was [facility], which [facility] not have supply of Vyvanse  I rewrite the prescription to [facility], also called patient, she has follow-up appointment on December 16

## 2023-09-13 ENCOUNTER — Other Ambulatory Visit: Payer: Self-pay

## 2023-09-13 ENCOUNTER — Other Ambulatory Visit (HOSPITAL_BASED_OUTPATIENT_CLINIC_OR_DEPARTMENT_OTHER): Payer: Self-pay

## 2023-09-13 MED ORDER — LISDEXAMFETAMINE DIMESYLATE 70 MG PO CAPS
70.0000 mg | ORAL_CAPSULE | Freq: Every day | ORAL | 0 refills | Status: DC
  Filled 2023-09-13: qty 90, 90d supply, fill #0

## 2023-09-13 NOTE — Progress Notes (Signed)
Pharmacy request change

## 2023-09-13 NOTE — Telephone Encounter (Signed)
I have requested new refill to requested pharmacy to provider

## 2023-09-16 MED ORDER — LISDEXAMFETAMINE DIMESYLATE 70 MG PO CAPS
70.0000 mg | ORAL_CAPSULE | Freq: Every day | ORAL | 0 refills | Status: DC
  Filled 2023-09-16: qty 90, 90d supply, fill #0

## 2023-09-17 ENCOUNTER — Other Ambulatory Visit (HOSPITAL_BASED_OUTPATIENT_CLINIC_OR_DEPARTMENT_OTHER): Payer: Self-pay

## 2023-09-20 ENCOUNTER — Encounter: Payer: Self-pay | Admitting: Adult Health

## 2023-09-20 ENCOUNTER — Other Ambulatory Visit: Payer: Self-pay | Admitting: *Deleted

## 2023-09-20 ENCOUNTER — Other Ambulatory Visit (HOSPITAL_BASED_OUTPATIENT_CLINIC_OR_DEPARTMENT_OTHER): Payer: Self-pay

## 2023-09-20 ENCOUNTER — Ambulatory Visit: Payer: BC Managed Care – PPO | Admitting: Adult Health

## 2023-09-20 VITALS — BP 102/71 | HR 88 | Ht 63.0 in | Wt 141.0 lb

## 2023-09-20 DIAGNOSIS — Z9225 Personal history of immunosupression therapy: Secondary | ICD-10-CM

## 2023-09-20 DIAGNOSIS — G47419 Narcolepsy without cataplexy: Secondary | ICD-10-CM

## 2023-09-20 DIAGNOSIS — Z111 Encounter for screening for respiratory tuberculosis: Secondary | ICD-10-CM

## 2023-09-20 DIAGNOSIS — Z79899 Other long term (current) drug therapy: Secondary | ICD-10-CM

## 2023-09-20 MED ORDER — AMPHETAMINE-DEXTROAMPHETAMINE 30 MG PO TABS
30.0000 mg | ORAL_TABLET | Freq: Every day | ORAL | 0 refills | Status: AC
  Filled 2023-09-20: qty 5, 5d supply, fill #0

## 2023-09-20 NOTE — Progress Notes (Signed)
PATIENT: Kathleen Alvarez DOB: 1966-06-07  REASON FOR VISIT: follow up HISTORY FROM: patient PRIMARY NEUROLOGIST: Dr. Vickey Huger  Chief Complaint  Patient presents with   Follow-up    Pt in 19, here alone  Pt is here following up on Narcolepsy. Pt states she has been doing well with medication. ESS 16, FSS 47     HISTORY OF PRESENT ILLNESS: Today 09/20/23  Kathleen Alvarez is a 57 y.o. 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 Returns today for follow-up.  She is currently on Vyvanse 70 mg capsule daily.  She states that this works well for her.  Denies falling asleep while driving or working.  She states that she cannot go a day without it.  She states that Dr. Vickey Huger has given her a prescription of Adderall 30 mg tablets to use only if she is unable to get her Vyvanse on time. She states that she will have to miss work if she does not have any medication to take.  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  The patient has been treated with initially Adderall and later Vyvanse to tolerates this medication very well.  That there was a recent shortage and she had to switch on the generic form at 70 mg daily po.  She is doing well on this generic form of Vyvanse.  Doing the bridge over.  She was on 30 mg of Adderall and did not feel that this left up to her expectation.  She has been sleeping- with 1 or 2  interruptions each night. On weekends she sleeps into the 9 AM range. Weekdays up at 7 AM. Naps in daytime  Vivid dreams are present, no cataplexy, Effexor controlled the lucid dreaming. 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  Here for refills.  The Epworth Sleepiness Scale was  endorsed at 16/24  points  when not medications- On medication; the Epworth score is today at 10/ 24 points.   with the 2 highest ratings for sitting and reading at 3 and lying  down in the afternoon with 3 points.  Sitting and talking to someone is only endorsed at 1 point as is driving her own car.  She has not had sleep attacks while driving.  Without medication however she would be in danger.     Her fit fatigue severity scale was endorsed at   30/ 63 while on meds.     REVIEW OF SYSTEMS: Out of a complete 14 system review of symptoms, the patient complains only of the following symptoms, and all other reviewed systems are negative.  ALLERGIES: Allergies  Allergen Reactions   Peg [Polyethylene Glycol] Itching and Nausea And Vomiting    Used when had colonoscopy prep    HOME MEDICATIONS: Outpatient Medications Prior to Visit  Medication Sig Dispense Refill   ACETYLCHOLINE CHLORIDE IO Take 1 capsule by mouth daily.     albuterol (PROVENTIL HFA;VENTOLIN HFA) 108 (90 BASE) MCG/ACT inhaler Inhale 2 puffs into the lungs every 6 (six) hours as needed. For exercise-induced bronchospasm     amphetamine-dextroamphetamine (ADDERALL) 10 MG tablet Take 10 mg by mouth as needed (alertness).     ASPIRIN LOW DOSE 81 MG tablet TAKE 1 TABLET BY MOUTH EVERY DAY 90 tablet 3   BIJUVA 1-100 MG CAPS Take 1 capsule by mouth daily.     cholecalciferol (VITAMIN D) 1000 UNITS tablet Take 5,000 Units  by mouth. Monday-Friday     HYRIMOZ 40 MG/0.4ML SOAJ INJECT 1 PEN UNDER THE SKIN EVERY 21 DAYS 0.8 mL 0   ibuprofen (ADVIL) 800 MG tablet Take 1 tablet (800 mg total) by mouth every 8 (eight) hours as needed. (Patient taking differently: Take 800 mg by mouth every 8 (eight) hours as needed for headache or mild pain.) 12 tablet 0   lisdexamfetamine (VYVANSE) 70 MG capsule Take 1 capsule (70 mg total) by mouth daily. 90 capsule 0   Multiple Vitamin (MULTIVITAMIN WITH MINERALS) TABS Take 1 tablet by mouth daily.     NON FORMULARY Take 1 capsule by mouth daily. Tributyrin 350     NON FORMULARY Take 1 capsule by mouth daily. Homocysteine     NON FORMULARY Take 1 capsule by mouth daily.  Essential aminos     Omega-3 Fatty Acids (FISH OIL PO) Take 2,400 mg by mouth in the morning and at bedtime.     Probiotic Product (PROBIOTIC DAILY PO) Take 1 capsule by mouth as needed. (Patient not taking: Reported on 08/07/2022)     rosuvastatin (CRESTOR) 5 MG tablet TAKE 1 TABLET BY MOUTH EVERYDAY AT BEDTIME 90 tablet 1   venlafaxine (EFFEXOR) 37.5 MG tablet Take 37.5 mg by mouth daily.     No facility-administered medications prior to visit.    PAST MEDICAL HISTORY: Past Medical History:  Diagnosis Date   Anxiety    Hyperlipidemia    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   RA (rheumatoid arthritis) (HCC)    TIA (transient ischemic attack)     PAST SURGICAL HISTORY: Past Surgical History:  Procedure Laterality Date   CESAREAN SECTION     x2   COLONOSCOPY  2009   family history of colon ca   DILITATION & CURRETTAGE/HYSTROSCOPY WITH NOVASURE ABLATION  08/26/2012   Procedure: DILATATION & CURETTAGE/HYSTEROSCOPY WITH NOVASURE ABLATION;  Surgeon: Lenoard Aden, MD;  Location: [facility];  Service: Gynecology;  Laterality: N/A;   lipoma      WISDOM TOOTH EXTRACTION      FAMILY HISTORY: Family History  Problem Relation Age of Onset   Colon cancer Father        deceased   Liver cancer Father 73       deceased    Gout Brother     SOCIAL HISTORY: Social History   Socioeconomic History   Marital status: Married    Spouse name: [name]    Number of children: 2   Years of education: 12+   Highest education level: Not on file  Occupational History   Occupation: Photographer: [employer]  Tobacco Use   Smoking status: Never    Passive exposure: Never   Smokeless tobacco: Never  Vaping Use   Vaping status: Never Used  Substance and Sexual Activity   Alcohol use: No   Drug use: No   Sexual activity: Not on file  Other Topics Concern   Not on file  Social History Narrative   Patient is employed.    Patient married with 2 children.       Social Drivers of Corporate investment banker Strain: Not on file  Food Insecurity: Not on file  Transportation Needs: Not on file  Physical Activity: Not on file  Stress: Not on file  Social Connections: Not on file  Intimate Partner Violence: Not on file      PHYSICAL EXAM  Vitals:   09/20/23 1408  BP: 102/71  Pulse: 88  Weight: 141 lb (64 kg)  Height: 5\' 3"  (1.6 m)   Body mass index is 24.98 kg/m.  Generalized: Well developed, in no acute distress   Neurological examination  Mentation: Alert oriented to time, place, history taking. Follows all commands speech and language fluent Cranial nerve II-XII: Pupils were equal round reactive to light. Extraocular movements were full, visual field were full on confrontational test. Facial sensation and strength were normal.  Head turning and shoulder shrug  were normal and symmetric. Motor: The motor testing reveals 5 over 5 strength of all 4 extremities. Good symmetric motor tone is noted throughout.  Sensory: Sensory testing is intact to soft touch on all 4 extremities. No evidence of extinction is noted.  Coordination: Cerebellar testing reveals good finger-nose-finger and heel-to-shin bilaterally.  Gait and station: Gait is normal.   DIAGNOSTIC DATA (LABS, IMAGING, TESTING) - I reviewed patient records, labs, notes, testing and imaging myself where available.  Lab Results  Component Value Date   WBC 6.4 03/31/2023   HGB 14.9 03/31/2023   HCT 44.8 03/31/2023   MCV 88.7 03/31/2023   PLT 310 03/31/2023      Component Value Date/Time   NA 141 03/31/2023 1311   NA 137 04/30/2021 1015   K 5.2 03/31/2023 1311   CL 101 03/31/2023 1311   CO2 32 03/31/2023 1311   GLUCOSE 101 (H) 03/31/2023 1311   BUN 21 03/31/2023 1311   BUN 12 04/30/2021 1015   CREATININE 0.83 03/31/2023 1311   CALCIUM 10.3 03/31/2023 1311   PROT 6.8 03/31/2023 1311   PROT 6.5 04/30/2021 1015   ALBUMIN 4.4 04/30/2021 1015   AST 30 03/31/2023 1311    ALT 25 03/31/2023 1311   ALKPHOS 64 04/30/2021 1015   BILITOT 0.6 03/31/2023 1311   BILITOT 0.3 04/30/2021 1015   GFRNONAA 72 03/25/2021 1450   GFRAA 83 03/25/2021 1450   Lab Results  Component Value Date   CHOL 189 04/30/2021   HDL 69 04/30/2021   LDLCALC 103 (H) 04/30/2021   LDLDIRECT 98 04/30/2021   TRIG 96 04/30/2021   CHOLHDL 3.7 11/27/2020     ASSESSMENT AND PLAN 57 y.o. year old female  has a past medical history of Anxiety, Hyperlipidemia, 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.00) (09/19/2014), RA (rheumatoid arthritis) (HCC), and TIA (transient ischemic attack). here with:  Narcolepsy without cataplexy  -Continue Vyvanse 70 mg daily -I sent prescription in  Adderall 30 mg to use only if she is unable to get Vyvanse when due and is without medication.  Only 5 tablets prescribed.  She was advised that she should not take Vyvanse and Adderall together. -I did advise the patient that she can contact our office 5 days prior to her prescription being due to request a new prescription.  This will ensure that she does not delayed in getting her medication.  She voiced understanding but she does state that sometimes she gets busy and forgets to contact us. -Follow-up in 6 to 7 months or sooner if needed     Butch Penny, MSN, NP-C 09/20/2023, 2:02 PM [facility] [address], [address] [place], [place] [address] [phone]

## 2023-09-21 NOTE — Progress Notes (Signed)
CBC and CMP are stable.

## 2023-09-22 LAB — COMPLETE METABOLIC PANEL WITH GFR
AG Ratio: 1.6 (calc) (ref 1.0–2.5)
ALT: 22 U/L (ref 6–29)
AST: 28 U/L (ref 10–35)
Albumin: 4.4 g/dL (ref 3.6–5.1)
Alkaline phosphatase (APISO): 54 U/L (ref 37–153)
BUN: 20 mg/dL (ref 7–25)
CO2: 29 mmol/L (ref 20–32)
Calcium: 10.1 mg/dL (ref 8.6–10.4)
Chloride: 103 mmol/L (ref 98–110)
Creat: 0.83 mg/dL (ref 0.50–1.03)
Globulin: 2.7 g/dL (ref 1.9–3.7)
Glucose, Bld: 92 mg/dL (ref 65–99)
Potassium: 4.7 mmol/L (ref 3.5–5.3)
Sodium: 143 mmol/L (ref 135–146)
Total Bilirubin: 0.7 mg/dL (ref 0.2–1.2)
Total Protein: 7.1 g/dL (ref 6.1–8.1)
eGFR: 83 mL/min/{1.73_m2} (ref >=60)

## 2023-09-22 LAB — CBC WITH DIFFERENTIAL/PLATELET
Absolute Lymphocytes: 2250 {cells}/uL (ref 850–3900)
Absolute Monocytes: 525 {cells}/uL (ref 200–950)
Basophils Absolute: 68 {cells}/uL (ref 0–200)
Basophils Relative: 0.9 %
Eosinophils Absolute: 90 {cells}/uL (ref 15–500)
Eosinophils Relative: 1.2 %
HCT: 45.6 % — ABNORMAL HIGH (ref 35.0–45.0)
Hemoglobin: 15.2 g/dL (ref 11.7–15.5)
MCH: 29.6 pg (ref 27.0–33.0)
MCHC: 33.3 g/dL (ref 32.0–36.0)
MCV: 88.9 fL (ref 80.0–100.0)
MPV: 11 fL (ref 7.5–12.5)
Monocytes Relative: 7 %
Neutro Abs: 4568 {cells}/uL (ref 1500–7800)
Neutrophils Relative %: 60.9 %
Platelets: 360 10*3/uL (ref 140–400)
RBC: 5.13 10*6/uL — ABNORMAL HIGH (ref 3.80–5.10)
RDW: 11.4 % (ref 11.0–15.0)
Total Lymphocyte: 30 %
WBC: 7.5 10*3/uL (ref 3.8–10.8)

## 2023-09-22 LAB — QUANTIFERON-TB GOLD PLUS
Mitogen-NIL: >10 [IU]/mL
NIL: 0.1 [IU]/mL
QuantiFERON-TB Gold Plus: NEGATIVE
TB1-NIL: 0.01 [IU]/mL
TB2-NIL: 0 [IU]/mL

## 2023-09-23 NOTE — Progress Notes (Signed)
TB Gold is negative.

## 2023-10-13 ENCOUNTER — Other Ambulatory Visit: Payer: Self-pay | Admitting: Rheumatology

## 2023-10-13 DIAGNOSIS — M0579 Rheumatoid arthritis with rheumatoid factor of multiple sites without organ or systems involvement: Secondary | ICD-10-CM

## 2023-10-13 NOTE — Telephone Encounter (Signed)
 Last Fill: 09/08/2023 (30 day supply)  Labs: 09/20/2023 CBC and CMP are stable.   TB Gold: 09/20/2023 Neg    Next Visit: 11/25/2023  Last Visit: 06/24/2023  IK:Myzlfjunpi arthritis involving multiple sites with positive rheumatoid factor   Current Dose per office note 06/24/2023: Hyrimoz  40mg  every 21 days   Okay to refill Hyrimoz ?

## 2023-10-29 ENCOUNTER — Other Ambulatory Visit: Payer: Self-pay | Admitting: Neurology

## 2023-11-01 NOTE — Telephone Encounter (Signed)
Reviewed Dr. Oliva Bustard previous notes.  She wanted the patient to continue on aspirin.  I will send in a refill.

## 2023-11-01 NOTE — Telephone Encounter (Signed)
Last seen on 09/20/23 Follow up scheduled on 05/23/24  Patient saw you last I just want to confirm she is to continue to take aspirin 81 mg?   Please advise

## 2023-11-04 NOTE — Progress Notes (Unsigned)
Office Visit Note  Patient: Kathleen Alvarez             Date of Birth: 12-24-1965           MRN: [id number]             PCP: Darrow Bussing, MD Referring: Darrow Bussing, MD Visit Date: 11/18/2023 Occupation: @GUAROCC @  Subjective:  Medication monitoring   History of Present Illness: Kathleen Alvarez is a 58 y.o. female with history of seropositive rheumatoid arthritis.  Patient remains on Hyrimoz 40mg  every 21 days.  She is tolerating Hyrimoz without any side effects or injection site reactions.  She has not had any recent gaps in therapy.  She denies any breakthrough symptoms as she is spacing the dosing of Hyrimoz.  She denies any morning stiffness, nocturnal pain, or difficulty with ADLs.  She has not had any increased joint pain or joint swelling.  She continues to go to [employer] 3 to 4 days a week for exercise. She denies any recent or recurrent infections.  She denies any new medical conditions.     Activities of Daily Living:  Patient reports morning stiffness for 0 minute.   Patient Denies nocturnal pain.  Difficulty dressing/grooming: Denies Difficulty climbing stairs: Denies Difficulty getting out of chair: Denies Difficulty using hands for taps, buttons, cutlery, and/or writing: Denies  Review of Systems  Constitutional:  Positive for fatigue.  HENT:  Negative for mouth sores and mouth dryness.   Eyes:  Negative for dryness.  Respiratory:  Negative for shortness of breath.   Cardiovascular:  Negative for chest pain and palpitations.  Gastrointestinal:  Negative for blood in stool, constipation and diarrhea.  Endocrine: Negative for increased urination.  Genitourinary:  Negative for involuntary urination.  Musculoskeletal:  Negative for joint pain, gait problem, joint pain, joint swelling, myalgias, muscle weakness, morning stiffness, muscle tenderness and myalgias.  Skin:  Negative for color change, rash, hair loss and sensitivity to sunlight.  Allergic/Immunologic:  Negative for susceptible to infections.  Neurological:  Negative for dizziness and headaches.  Hematological:  Negative for swollen glands.  Psychiatric/Behavioral:  Positive for sleep disturbance. Negative for depressed mood. The patient is not nervous/anxious.     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    Past Medical History:  Diagnosis Date   Anxiety    Hyperlipidemia    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   RA (rheumatoid arthritis) (HCC)    TIA (transient ischemic attack)     Family History  Problem Relation Age of Onset   Colon cancer Father        deceased   Liver cancer Father 22       deceased    Gout Brother    Past Surgical History:  Procedure Laterality Date   CESAREAN SECTION     x2   COLONOSCOPY  2009   family history of colon ca   DILITATION & CURRETTAGE/HYSTROSCOPY WITH NOVASURE ABLATION  08/26/2012   Procedure: DILATATION & CURETTAGE/HYSTEROSCOPY WITH  NOVASURE ABLATION;  Surgeon: Lenoard Aden, MD;  Location: [facility];  Service: Gynecology;  Laterality: N/A;   lipoma      WISDOM TOOTH EXTRACTION     Social History   Social History Narrative   Patient is employed.    Patient married with 2 children.      Immunization History  Administered Date(s) Administered   Influenza, Seasonal, Injecte, Preservative Fre 08/31/2014   Influenza,inj,Quad PF,6+ Mos 08/22/2019,  09/19/2020   Moderna Sars-Covid-2 Vaccination 12/14/2019, 01/16/2020, 09/23/2020     Objective: Vital Signs: BP 105/68 (BP Location: Left Arm, Patient Position: Sitting, Cuff Size: Normal)   Pulse 78   Resp 14   Ht 5\' 3"  (1.6 m)   Wt 145 lb (65.8 kg)   BMI 25.69 kg/m    Physical Exam Vitals and nursing note reviewed.  Constitutional:      Appearance: She is well-developed.  HENT:     Head: Normocephalic and atraumatic.  Eyes:     Conjunctiva/sclera: Conjunctivae normal.  Cardiovascular:     Rate and Rhythm: Normal rate and regular rhythm.     Heart sounds: Normal heart sounds.  Pulmonary:     Effort: Pulmonary effort is normal.     Breath sounds: Normal breath sounds.  Abdominal:     General: Bowel sounds are normal.     Palpations: Abdomen is soft.  Musculoskeletal:     Cervical back: Normal range of motion.  Lymphadenopathy:     Cervical: No cervical adenopathy.  Skin:    General: Skin is warm and dry.     Capillary Refill: Capillary refill takes less than 2 seconds.  Neurological:     Mental Status: She is alert and oriented to person, place, and time.  Psychiatric:        Behavior: Behavior normal.      Musculoskeletal Exam: C-spine, thoracic spine, lumbar spine have good range of motion.  Shoulder joints, elbow joints, wrist joints MCPs, PIPs, DIPs have good range of motion with no synovitis.  Complete fist formation bilaterally.  Hip joints have good range of motion with no groin pain.  Knee joints have good range of motion no warmth or effusion.  Ankle joints have good range of motion with no tenderness or joint swelling.  CDAI Exam: CDAI Score: -- Patient Global: --; Provider Global: -- Swollen: --; Tender: -- Joint Exam 11/18/2023   No joint exam has been documented for this visit   There is currently no information documented on the homunculus. Go to the Rheumatology activity and complete the homunculus joint exam.  Investigation: No additional  findings.  Imaging: No results found.  Recent Labs: Lab Results  Component Value Date   WBC 7.5 09/20/2023   HGB 15.2 09/20/2023   PLT 360 09/20/2023   NA 143 09/20/2023   K 4.7 09/20/2023   CL 103 09/20/2023   CO2 29 09/20/2023   GLUCOSE 92 09/20/2023   BUN 20 09/20/2023   CREATININE 0.83 09/20/2023   BILITOT 0.7 09/20/2023   ALKPHOS 64 04/30/2021   AST 28 09/20/2023   ALT 22 09/20/2023   PROT 7.1 09/20/2023   ALBUMIN 4.4 04/30/2021   CALCIUM 10.1 09/20/2023   GFRAA 83 03/25/2021   QFTBGOLDPLUS NEGATIVE 09/20/2023    Speciality Comments: No specialty comments available.  Procedures:  No procedures performed Allergies: Peg [polyethylene glycol]   Assessment / Plan:     Visit Diagnoses: Rheumatoid arthritis involving multiple sites with positive rheumatoid factor (HCC) - +RF, +CCP:  She has no synovitis on examination today.  She has not had any signs or symptoms of a rheumatoid arthritis flare.  She has not been experiencing any morning stiffness, nocturnal pain, or difficulty with ADLs.  She continues to go to [employer] 3 to 4 days a week for exercise.  She remains on Hyrimoz 40 mg sq injections every 21 days.  She is tolerating Hyrimoz without any side effects or injection site reactions.  She has not been experiencing any breakthrough symptoms as she has been spacing the dosing of Hyrimoz.  No medication changes will be made at this time.  She was advised to notify us if she develops signs or symptoms of a flare.  She will follow-up in the office in 5 months or sooner if needed.  High risk medication use - Hyrimoz 40mg  every 21 days. CBC and CMP updated on 09/20/23. Her next lab work will be due in March and every 3 months.  Standing orders for CBC and CMP remain in place. TB gold negative on 09/20/23.  No recent or recurrent infections. Discussed the importance of holding hyrimoz if she develops signs or symptoms of an infection and to resume once the infection has  completely cleared.   Other fatigue: Chronic, stable.  Other medical conditions are listed as follows:  TIA (transient ischemic attack) - February 2022.  She takes aspirin 81 mg p.o. daily.  Narcolepsy due to underlying condition without cataplexy  Postmenopausal - DEXA scan on May 18, 2018 which was within normal limits.  DEXA order placed   Attention deficit hyperactivity disorder (ADHD), predominantly inattentive type  Dyslipidemia: Future order for lipid panel placed today.   Orders: Orders Placed This Encounter  Procedures   Lipid panel   No orders of the defined types were placed in this encounter.    Follow-Up Instructions: Return in about 5 months (around 04/16/2024) for Rheumatoid arthritis.   Gearldine Bienenstock, PA-C  Note - This record has been created using Dragon software.  Chart creation errors have been sought, but may not always  have been located. Such creation errors do not reflect on  the standard of medical care.

## 2023-11-18 ENCOUNTER — Ambulatory Visit: Payer: 59 | Attending: Physician Assistant | Admitting: Physician Assistant

## 2023-11-18 ENCOUNTER — Encounter: Payer: Self-pay | Admitting: Physician Assistant

## 2023-11-18 VITALS — BP 105/68 | HR 78 | Resp 14 | Ht 63.0 in | Wt 145.0 lb

## 2023-11-18 DIAGNOSIS — Z79899 Other long term (current) drug therapy: Secondary | ICD-10-CM

## 2023-11-18 DIAGNOSIS — M0579 Rheumatoid arthritis with rheumatoid factor of multiple sites without organ or systems involvement: Secondary | ICD-10-CM | POA: Diagnosis not present

## 2023-11-18 DIAGNOSIS — Z780 Asymptomatic menopausal state: Secondary | ICD-10-CM

## 2023-11-18 DIAGNOSIS — E785 Hyperlipidemia, unspecified: Secondary | ICD-10-CM

## 2023-11-18 DIAGNOSIS — R5383 Other fatigue: Secondary | ICD-10-CM | POA: Diagnosis not present

## 2023-11-18 DIAGNOSIS — F900 Attention-deficit hyperactivity disorder, predominantly inattentive type: Secondary | ICD-10-CM

## 2023-11-18 DIAGNOSIS — G459 Transient cerebral ischemic attack, unspecified: Secondary | ICD-10-CM | POA: Diagnosis not present

## 2023-11-18 DIAGNOSIS — G47429 Narcolepsy in conditions classified elsewhere without cataplexy: Secondary | ICD-10-CM

## 2023-11-18 NOTE — Patient Instructions (Signed)
Standing Labs We placed an order today for your standing lab work.   Please have your standing labs drawn in mid-march and every 3 months   Please have your labs drawn 2 weeks prior to your appointment so that the provider can discuss your lab results at your appointment, if possible.  Please note that you may see your imaging and lab results in MyChart before we have reviewed them. We will contact you once all results are reviewed. Please allow our office up to 72 hours to thoroughly review all of the results before contacting the office for clarification of your results.  WALK-IN LAB HOURS  Monday through Thursday from 8:00 am -12:30 pm and 1:00 pm-5:00 pm and Friday from 8:00 am-12:00 pm.  Patients with office visits requiring labs will be seen before walk-in labs.  You may encounter longer than normal wait times. Please allow additional time. Wait times may be shorter on  Monday and Thursday afternoons.  We do not book appointments for walk-in labs. We appreciate your patience and understanding with our staff.   Labs are drawn by [facility]. Please bring your co-pay at the time of your lab draw.  You may receive a bill from [facility] for your lab work.  Please note if you are on Hydroxychloroquine and and an order has been placed for a Hydroxychloroquine level,  you will need to have it drawn 4 hours or more after your last dose.  If you wish to have your labs drawn at another location, please call the office 24 hours in advance so we can fax the orders.  The office is located at [address], [address], [place], [place] [address]   If you have any questions regarding directions or hours of operation,  please call [phone].   As a reminder, please drink plenty of water prior to coming for your lab work. Thanks!

## 2023-11-25 ENCOUNTER — Ambulatory Visit: Payer: BC Managed Care – PPO | Admitting: Physician Assistant

## 2023-12-07 ENCOUNTER — Other Ambulatory Visit (HOSPITAL_BASED_OUTPATIENT_CLINIC_OR_DEPARTMENT_OTHER): Payer: Self-pay

## 2023-12-07 ENCOUNTER — Encounter: Payer: Self-pay | Admitting: Adult Health

## 2023-12-07 MED ORDER — LISDEXAMFETAMINE DIMESYLATE 70 MG PO CAPS
70.0000 mg | ORAL_CAPSULE | Freq: Every day | ORAL | 0 refills | Status: DC
  Filled 2023-12-07: qty 90, 90d supply, fill #0

## 2023-12-07 NOTE — Telephone Encounter (Signed)
 Last appt: 09/20/23 Next appt: 05/23/24 Last fills:  Rx refill sent to [facility] NP.

## 2024-01-17 ENCOUNTER — Other Ambulatory Visit: Payer: Self-pay | Admitting: Rheumatology

## 2024-01-17 DIAGNOSIS — Z79899 Other long term (current) drug therapy: Secondary | ICD-10-CM

## 2024-01-17 DIAGNOSIS — M0579 Rheumatoid arthritis with rheumatoid factor of multiple sites without organ or systems involvement: Secondary | ICD-10-CM

## 2024-01-17 NOTE — Telephone Encounter (Signed)
 Last Fill: 10/13/2023  Labs: 09/20/2023 CBC and CMP are stable.   TB Gold: 09/20/2023 Neg    Next Visit: 04/19/2024  Last Visit: 11/18/2023  DX: Rheumatoid arthritis involving multiple sites with positive rheumatoid factor   Current Dose per office note 11/18/2023: Hyrimoz 40mg  every 21 days.   Left message to advise patient she is due to update labs.   Okay to refill Hyrimoz?

## 2024-02-18 ENCOUNTER — Other Ambulatory Visit: Payer: Self-pay | Admitting: *Deleted

## 2024-02-18 DIAGNOSIS — M0579 Rheumatoid arthritis with rheumatoid factor of multiple sites without organ or systems involvement: Secondary | ICD-10-CM

## 2024-02-18 DIAGNOSIS — Z79899 Other long term (current) drug therapy: Secondary | ICD-10-CM

## 2024-02-18 DIAGNOSIS — E785 Hyperlipidemia, unspecified: Secondary | ICD-10-CM

## 2024-02-18 LAB — CBC WITH DIFFERENTIAL/PLATELET
Absolute Lymphocytes: 2419 {cells}/uL (ref 850–3900)
Absolute Monocytes: 630 {cells}/uL (ref 200–950)
Basophils Absolute: 80 {cells}/uL (ref 0–200)
Basophils Relative: 1.2 %
Eosinophils Absolute: 67 {cells}/uL (ref 15–500)
Eosinophils Relative: 1 %
HCT: 45.6 % — ABNORMAL HIGH (ref 35.0–45.0)
Hemoglobin: 14.9 g/dL (ref 11.7–15.5)
MCH: 29.6 pg (ref 27.0–33.0)
MCHC: 32.7 g/dL (ref 32.0–36.0)
MCV: 90.5 fL (ref 80.0–100.0)
MPV: 10.5 fL (ref 7.5–12.5)
Monocytes Relative: 9.4 %
Neutro Abs: 3504 {cells}/uL (ref 1500–7800)
Neutrophils Relative %: 52.3 %
Platelets: 302 10*3/uL (ref 140–400)
RBC: 5.04 10*6/uL (ref 3.80–5.10)
RDW: 12.1 % (ref 11.0–15.0)
Total Lymphocyte: 36.1 %
WBC: 6.7 10*3/uL (ref 3.8–10.8)

## 2024-02-18 LAB — COMPREHENSIVE METABOLIC PANEL WITH GFR
AG Ratio: 2 (calc) (ref 1.0–2.5)
ALT: 20 U/L (ref 6–29)
AST: 25 U/L (ref 10–35)
Albumin: 4.5 g/dL (ref 3.6–5.1)
Alkaline phosphatase (APISO): 64 U/L (ref 37–153)
BUN: 18 mg/dL (ref 7–25)
CO2: 32 mmol/L (ref 20–32)
Calcium: 9.8 mg/dL (ref 8.6–10.4)
Chloride: 103 mmol/L (ref 98–110)
Creat: 0.9 mg/dL (ref 0.50–1.03)
Globulin: 2.2 g/dL (ref 1.9–3.7)
Glucose, Bld: 101 mg/dL — ABNORMAL HIGH (ref 65–99)
Potassium: 5 mmol/L (ref 3.5–5.3)
Sodium: 141 mmol/L (ref 135–146)
Total Bilirubin: 0.7 mg/dL (ref 0.2–1.2)
Total Protein: 6.7 g/dL (ref 6.1–8.1)
eGFR: 75 mL/min/{1.73_m2} (ref >=60)

## 2024-02-18 LAB — LIPID PANEL
Cholesterol: 283 mg/dL — ABNORMAL HIGH (ref <200)
HDL: 77 mg/dL (ref >=50)
LDL Cholesterol (Calc): 168 mg/dL — ABNORMAL HIGH
Non-HDL Cholesterol (Calc): 206 mg/dL — ABNORMAL HIGH (ref <130)
Total CHOL/HDL Ratio: 3.7 (calc) (ref <5.0)
Triglycerides: 222 mg/dL — ABNORMAL HIGH (ref <150)

## 2024-02-20 ENCOUNTER — Ambulatory Visit: Payer: Self-pay | Admitting: Physician Assistant

## 2024-02-20 NOTE — Progress Notes (Signed)
 CBC and CMP WNL  Total cholesterol is elevated-283. Triglycerides elevated-222.  LDL is elevated.  Please forward results to PCP.

## 2024-03-07 ENCOUNTER — Other Ambulatory Visit: Payer: Self-pay | Admitting: Adult Health

## 2024-03-09 ENCOUNTER — Other Ambulatory Visit (HOSPITAL_BASED_OUTPATIENT_CLINIC_OR_DEPARTMENT_OTHER): Payer: Self-pay

## 2024-03-09 MED ORDER — LISDEXAMFETAMINE DIMESYLATE 70 MG PO CAPS
70.0000 mg | ORAL_CAPSULE | Freq: Every day | ORAL | 0 refills | Status: DC
  Filled 2024-03-09: qty 90, 90d supply, fill #0

## 2024-03-09 NOTE — Telephone Encounter (Signed)
 Last seen 09/20/23 and next f/u 05/23/24. Last refilled 12/08/23 #90.

## 2024-03-23 ENCOUNTER — Other Ambulatory Visit: Payer: Self-pay | Admitting: Physician Assistant

## 2024-03-23 DIAGNOSIS — M0579 Rheumatoid arthritis with rheumatoid factor of multiple sites without organ or systems involvement: Secondary | ICD-10-CM

## 2024-03-23 NOTE — Telephone Encounter (Signed)
 Last Fill: 01/17/2024  Labs: 02/18/2024 CBC and CMP WNL   TB Gold: 09/20/2023 Neg    Next Visit: 04/19/2024  Last Visit: 11/18/2023  DX: Rheumatoid arthritis involving multiple sites with positive rheumatoid factor   Current Dose per office note 11/18/2023: Hyrimoz  40mg  every 21 days.   Okay to refill Hyrimoz ?

## 2024-04-05 NOTE — Progress Notes (Deleted)
 Office Visit Note  Patient: Kathleen Alvarez             Date of Birth: Feb 12, 1966           MRN: [id number]             PCP: Regino Slater, MD Referring: Regino Slater, MD Visit Date: 04/19/2024 Occupation: @GUAROCC @  Subjective:  No chief complaint on file.   History of Present Illness: [name] is a 58 y.o. female ***     Activities of Daily Living:  Patient reports morning stiffness for *** {minute/hour:19697}.   Patient {ACTIONS;DENIES/REPORTS:21021675::Denies} nocturnal pain.  Difficulty dressing/grooming: {ACTIONS;DENIES/REPORTS:21021675::Denies} Difficulty climbing stairs: {ACTIONS;DENIES/REPORTS:21021675::Denies} Difficulty getting out of chair: {ACTIONS;DENIES/REPORTS:21021675::Denies} Difficulty using hands for taps, buttons, cutlery, and/or writing: {ACTIONS;DENIES/REPORTS:21021675::Denies}  No Rheumatology ROS completed.   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    Past Medical History:  Diagnosis Date   Anxiety    Hyperlipidemia    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   RA (rheumatoid arthritis) (HCC)    TIA (transient ischemic attack)     Family History  Problem Relation Age of Onset   Colon cancer Father        deceased   Liver cancer Father 25       deceased    Gout Brother    Past Surgical History:  Procedure Laterality Date   CESAREAN SECTION      x2   COLONOSCOPY  2009   family history of colon ca   DILITATION & CURRETTAGE/HYSTROSCOPY WITH NOVASURE ABLATION  08/26/2012   Procedure: DILATATION & CURETTAGE/HYSTEROSCOPY WITH NOVASURE ABLATION;  Surgeon: Charlie JINNY Flowers, MD;  Location: [facility];  Service: Gynecology;  Laterality: N/A;   lipoma      WISDOM TOOTH EXTRACTION     Social History   Social History Narrative   Patient is employed.    Patient married with 2 children.      Immunization History  Administered Date(s) Administered   Influenza, Seasonal, Injecte, Preservative Fre 08/31/2014   Influenza,inj,Quad PF,6+ Mos 08/22/2019, 09/19/2020   Moderna Sars-Covid-2 Vaccination 12/14/2019, 01/16/2020, 09/23/2020     Objective: Vital Signs: There were no vitals taken for this visit.   Physical Exam   Musculoskeletal Exam: ***  CDAI Exam: CDAI Score: -- Patient Global: --; Provider Global: -- Swollen: --; Tender: -- Joint Exam 04/19/2024   No joint exam has been documented for this visit   There is currently no information documented on the homunculus. Go to the Rheumatology activity and complete the homunculus joint exam.  Investigation: No additional findings.  Imaging: No results found.  Recent Labs: Lab Results  Component Value Date   WBC 6.7 02/18/2024   HGB 14.9 02/18/2024   PLT 302 02/18/2024   NA 141 02/18/2024   K 5.0 02/18/2024   CL 103 02/18/2024   CO2 32 02/18/2024   GLUCOSE 101 (H) 02/18/2024   BUN 18 02/18/2024   CREATININE 0.90 02/18/2024   BILITOT 0.7 02/18/2024   ALKPHOS  64 04/30/2021   AST 25 02/18/2024   ALT 20 02/18/2024   PROT 6.7 02/18/2024   ALBUMIN 4.4 04/30/2021   CALCIUM  9.8 02/18/2024   GFRAA 83 03/25/2021   QFTBGOLDPLUS NEGATIVE 09/20/2023    Speciality Comments: No specialty comments available.  Procedures:  No procedures performed Allergies: Peg [polyethylene glycol]   Assessment / Plan:     Visit Diagnoses: Rheumatoid arthritis involving multiple sites with  positive rheumatoid factor (HCC)  High risk medication use  Dyslipidemia  Other fatigue  TIA (transient ischemic attack)  Narcolepsy due to underlying condition without cataplexy  Postmenopausal  Attention deficit hyperactivity disorder (ADHD), predominantly inattentive type  Orders: No orders of the defined types were placed in this encounter.  No orders of the defined types were placed in this encounter.   Face-to-face time spent with patient was *** minutes. Greater than 50% of time was spent in counseling and coordination of care.  Follow-Up Instructions: No follow-ups on file.   Waddell CHRISTELLA Craze, PA-C  Note - This record has been created using Dragon software.  Chart creation errors have been sought, but may not always  have been located. Such creation errors do not reflect on  the standard of medical care.

## 2024-04-09 ENCOUNTER — Encounter: Payer: Self-pay | Admitting: Rheumatology

## 2024-04-12 NOTE — Progress Notes (Signed)
 Patient likely needs to fill alternative adalimumab -adaz product and utilize other Hyrimoz  copay card billing information  Per rep at [facility], patient's copay card is not picking up entire copay but does not appear to be a deductible issue. Pharmacist changed NDC to [phone] and rep provided with updated copay card information. However when she tried to run a claim, it states that the [facility] change will need to be reviewed by the copay card information. Will have patient follow-up with pharmacy tomorrow since pharmacy is unable to disclose any copay-related information to provider's office    Sherry Pennant, PharmD, MPH, BCPS, CPP Clinical Pharmacist (Rheumatology and Pulmonology)

## 2024-04-19 ENCOUNTER — Ambulatory Visit: Payer: 59 | Admitting: Physician Assistant

## 2024-04-19 DIAGNOSIS — G459 Transient cerebral ischemic attack, unspecified: Secondary | ICD-10-CM

## 2024-04-19 DIAGNOSIS — G47429 Narcolepsy in conditions classified elsewhere without cataplexy: Secondary | ICD-10-CM

## 2024-04-19 DIAGNOSIS — M0579 Rheumatoid arthritis with rheumatoid factor of multiple sites without organ or systems involvement: Secondary | ICD-10-CM

## 2024-04-19 DIAGNOSIS — R5383 Other fatigue: Secondary | ICD-10-CM

## 2024-04-19 DIAGNOSIS — Z79899 Other long term (current) drug therapy: Secondary | ICD-10-CM

## 2024-04-19 DIAGNOSIS — E785 Hyperlipidemia, unspecified: Secondary | ICD-10-CM

## 2024-04-19 DIAGNOSIS — Z780 Asymptomatic menopausal state: Secondary | ICD-10-CM

## 2024-04-19 DIAGNOSIS — F900 Attention-deficit hyperactivity disorder, predominantly inattentive type: Secondary | ICD-10-CM

## 2024-05-03 NOTE — Progress Notes (Deleted)
 Office Visit Note  Patient: Kathleen Alvarez             Date of Birth: 1965-12-19           MRN: [id number]             PCP: Regino Slater, MD Referring: Regino Slater, MD Visit Date: 05/04/2024 Occupation: @GUAROCC @  Subjective:    History of Present Illness: Kathleen Alvarez is a 58 y.o. female with history of seropositive rheumatoid arthritis.  Patient remains on Hyrimoz  40mg  every 21 days.     CBC and CMP updated on 02/18/24.   TB gold negative on 09/20/23  Lipid panel updated on 02/18/24 Discussed the importance of holding hyrimoz  if she develops signs or symptoms of an infection and to resume once the infection has completely cleared.     Activities of Daily Living:  Patient reports morning stiffness for *** {minute/hour:19697}.   Patient {ACTIONS;DENIES/REPORTS:21021675::Denies} nocturnal pain.  Difficulty dressing/grooming: {ACTIONS;DENIES/REPORTS:21021675::Denies} Difficulty climbing stairs: {ACTIONS;DENIES/REPORTS:21021675::Denies} Difficulty getting out of chair: {ACTIONS;DENIES/REPORTS:21021675::Denies} Difficulty using hands for taps, buttons, cutlery, and/or writing: {ACTIONS;DENIES/REPORTS:21021675::Denies}  Review of Systems  Constitutional:  Negative for fatigue.  HENT:  Negative for mouth sores, mouth dryness and nose dryness.   Eyes:  Negative for pain, visual disturbance and dryness.  Respiratory:  Negative for cough, hemoptysis, shortness of breath and difficulty breathing.   Cardiovascular:  Negative for chest pain, palpitations, hypertension and swelling in legs/feet.  Gastrointestinal:  Negative for blood in stool, constipation and diarrhea.  Endocrine: Negative for increased urination.  Genitourinary:  Negative for painful urination.  Musculoskeletal:  Negative for joint pain, joint pain, joint swelling, myalgias, muscle weakness, morning stiffness, muscle tenderness and myalgias.  Skin:  Negative for color change, pallor, rash, hair loss,  nodules/bumps, skin tightness, ulcers and sensitivity to sunlight.  Neurological:  Negative for dizziness, numbness, headaches and weakness.  Hematological:  Negative for swollen glands.  Psychiatric/Behavioral:  Negative for depressed mood and sleep disturbance. The patient is not nervous/anxious.     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    Past Medical History:  Diagnosis Date   Anxiety    Hyperlipidemia    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   RA (rheumatoid arthritis) (HCC)    TIA (transient ischemic attack)     Family History  Problem Relation Age of Onset   Colon cancer Father        deceased   Liver cancer Father 67       deceased    Gout Brother    Past Surgical History:  Procedure Laterality Date   CESAREAN SECTION     x2   COLONOSCOPY  2009   family history of colon ca   DILITATION & CURRETTAGE/HYSTROSCOPY WITH NOVASURE ABLATION  08/26/2012   Procedure: DILATATION & CURETTAGE/HYSTEROSCOPY WITH NOVASURE ABLATION;  Surgeon: Charlie JINNY Flowers, MD;  Location: [facility];  Service: Gynecology;  Laterality: N/A;   lipoma      WISDOM TOOTH EXTRACTION  Social History   Social History Narrative   Patient is employed.    Patient married with 2 children.      Immunization History  Administered Date(s) Administered   Influenza, Seasonal, Injecte, Preservative Fre 08/31/2014    Influenza,inj,Quad PF,6+ Mos 08/22/2019, 09/19/2020   Moderna Sars-Covid-2 Vaccination 12/14/2019, 01/16/2020, 09/23/2020     Objective: Vital Signs: There were no vitals taken for this visit.   Physical Exam Vitals and nursing note reviewed.  Constitutional:      Appearance: She is well-developed.  HENT:     Head: Normocephalic and atraumatic.  Eyes:     Conjunctiva/sclera: Conjunctivae normal.  Cardiovascular:     Rate and Rhythm: Normal rate and regular rhythm.     Heart sounds: Normal heart sounds.  Pulmonary:     Effort: Pulmonary effort is normal.     Breath sounds: Normal breath sounds.  Abdominal:     General: Bowel sounds are normal.     Palpations: Abdomen is soft.  Musculoskeletal:     Cervical back: Normal range of motion.  Lymphadenopathy:     Cervical: No cervical adenopathy.  Skin:    General: Skin is warm and dry.     Capillary Refill: Capillary refill takes less than 2 seconds.  Neurological:     Mental Status: She is alert and oriented to person, place, and time.  Psychiatric:        Behavior: Behavior normal.      Musculoskeletal Exam: ***  CDAI Exam: CDAI Score: -- Patient Global: --; Provider Global: -- Swollen: --; Tender: -- Joint Exam 05/04/2024   No joint exam has been documented for this visit   There is currently no information documented on the homunculus. Go to the Rheumatology activity and complete the homunculus joint exam.  Investigation: No additional findings.  Imaging: No results found.  Recent Labs: Lab Results  Component Value Date   WBC 6.7 02/18/2024   HGB 14.9 02/18/2024   PLT 302 02/18/2024   NA 141 02/18/2024   K 5.0 02/18/2024   CL 103 02/18/2024   CO2 32 02/18/2024   GLUCOSE 101 (H) 02/18/2024   BUN 18 02/18/2024   CREATININE 0.90 02/18/2024   BILITOT 0.7 02/18/2024   ALKPHOS 64 04/30/2021   AST 25 02/18/2024   ALT 20 02/18/2024   PROT 6.7 02/18/2024   ALBUMIN 4.4 04/30/2021   CALCIUM  9.8 02/18/2024    GFRAA 83 03/25/2021   QFTBGOLDPLUS NEGATIVE 09/20/2023    Speciality Comments: No specialty comments available.  Procedures:  No procedures performed Allergies: Peg [polyethylene glycol]   Assessment / Plan:     Visit Diagnoses: Rheumatoid arthritis involving multiple sites with positive rheumatoid factor (HCC)  High risk medication use  Dyslipidemia  Other fatigue  TIA (transient ischemic attack)  Narcolepsy due to underlying condition without cataplexy  Postmenopausal  Attention deficit hyperactivity disorder (ADHD), predominantly inattentive type  Orders: No orders of the defined types were placed in this encounter.  No orders of the defined types were placed in this encounter.   Face-to-face time spent with patient was *** minutes. Greater than 50% of time was spent in counseling and coordination of care.  Follow-Up Instructions: No follow-ups on file.   Waddell CHRISTELLA Craze, PA-C  Note - This record has been created using Dragon software.  Chart creation errors have been sought, but may not always  have been located. Such creation errors do not reflect on  the standard of medical care.

## 2024-05-04 ENCOUNTER — Ambulatory Visit: Admitting: Physician Assistant

## 2024-05-04 DIAGNOSIS — Z780 Asymptomatic menopausal state: Secondary | ICD-10-CM

## 2024-05-04 DIAGNOSIS — Z79899 Other long term (current) drug therapy: Secondary | ICD-10-CM

## 2024-05-04 DIAGNOSIS — R5383 Other fatigue: Secondary | ICD-10-CM

## 2024-05-04 DIAGNOSIS — E785 Hyperlipidemia, unspecified: Secondary | ICD-10-CM

## 2024-05-04 DIAGNOSIS — G47429 Narcolepsy in conditions classified elsewhere without cataplexy: Secondary | ICD-10-CM

## 2024-05-04 DIAGNOSIS — F900 Attention-deficit hyperactivity disorder, predominantly inattentive type: Secondary | ICD-10-CM

## 2024-05-04 DIAGNOSIS — M0579 Rheumatoid arthritis with rheumatoid factor of multiple sites without organ or systems involvement: Secondary | ICD-10-CM

## 2024-05-04 DIAGNOSIS — G459 Transient cerebral ischemic attack, unspecified: Secondary | ICD-10-CM

## 2024-05-08 NOTE — Progress Notes (Unsigned)
 Office Visit Note  Patient: Kathleen Alvarez             Date of Birth: April 19, 1966           MRN: [id number]             PCP: Regino Slater, MD Referring: Regino Slater, MD Visit Date: 05/09/2024 Occupation: @GUAROCC @  Subjective:  Medication monitoring  History of Present Illness: Kathleen Alvarez is a 58 y.o. female with history of seropositive rheumatoid arthritis.  Patient remains on hyrimoz  40 mg sq injections every 21 days.  She continues to tolerate her meds without any side effects and has not had any recent gaps in therapy.  She denies any breakthrough symptoms leading up to these injections every 3 weeks.  She denies any morning stiffness, nocturnal pain, or difficulty performing ADLs.  She denies any joint swelling at this time.  Patient remains very active exercising on a regular basis. She denies any new medical conditions. She denies any recent or recurrent infections.      Activities of Daily Living:  Patient denies any morning stiffness  Patient Denies nocturnal pain.  Difficulty dressing/grooming: Denies Difficulty climbing stairs: Denies Difficulty getting out of chair: Denies Difficulty using hands for taps, buttons, cutlery, and/or writing: Denies  Review of Systems  Constitutional:  Negative for fatigue.  HENT:  Negative for mouth sores and mouth dryness.   Eyes:  Negative for dryness.  Respiratory:  Negative for shortness of breath.   Cardiovascular:  Negative for chest pain and palpitations.  Gastrointestinal:  Negative for blood in stool, constipation and diarrhea.  Endocrine: Negative for increased urination.  Genitourinary:  Negative for involuntary urination.  Musculoskeletal:  Negative for joint pain, gait problem, joint pain, joint swelling, myalgias, muscle weakness, morning stiffness, muscle tenderness and myalgias.  Skin:  Negative for color change, rash, hair loss and sensitivity to sunlight.  Allergic/Immunologic: Negative for susceptible to  infections.  Neurological:  Negative for dizziness and headaches.  Hematological:  Negative for swollen glands.  Psychiatric/Behavioral:  Positive for sleep disturbance. Negative for depressed mood. The patient is not nervous/anxious.     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    Past Medical History:  Diagnosis Date   Anxiety    Hyperlipidemia    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   RA (rheumatoid arthritis) (HCC)    TIA (transient ischemic attack)     Family History  Problem Relation Age of Onset   Other Mother        gallbladder surgery   Colon cancer Father        deceased   Liver cancer Father 62       deceased    Gout Brother    Past Surgical History:  Procedure Laterality Date   CESAREAN SECTION     x2   COLONOSCOPY  2009   family history of colon ca   DILITATION & CURRETTAGE/HYSTROSCOPY WITH NOVASURE ABLATION  08/26/2012   Procedure: DILATATION & CURETTAGE/HYSTEROSCOPY  WITH NOVASURE ABLATION;  Surgeon: Charlie JINNY Flowers, MD;  Location: [facility];  Service: Gynecology;  Laterality: N/A;   lipoma      WISDOM TOOTH EXTRACTION     Social History   Social History Narrative   Patient is employed.    Patient married with 2 children.      Immunization History  Administered Date(s) Administered   Influenza, Seasonal, Injecte, Preservative Fre 08/31/2014   Influenza,inj,Quad PF,6+  Mos 08/22/2019, 09/19/2020   Moderna Sars-Covid-2 Vaccination 12/14/2019, 01/16/2020, 09/23/2020     Objective: Vital Signs: BP 112/75 (BP Location: Left Arm, Patient Position: Sitting, Cuff Size: Normal)   Pulse 66   Resp 16   Ht 5' 3 (1.6 m)   Wt 134 lb 9.6 oz (61.1 kg)   BMI 23.84 kg/m    Physical Exam Vitals and nursing note reviewed.  Constitutional:      Appearance: She is well-developed.  HENT:     Head: Normocephalic and atraumatic.  Eyes:     Conjunctiva/sclera: Conjunctivae normal.  Cardiovascular:     Rate and Rhythm: Normal rate and regular rhythm.     Heart sounds: Normal heart sounds.  Pulmonary:     Effort: Pulmonary effort is normal.     Breath sounds: Normal breath sounds.  Abdominal:     General: Bowel sounds are normal.     Palpations: Abdomen is soft.  Musculoskeletal:     Cervical back: Normal range of motion.  Lymphadenopathy:     Cervical: No cervical adenopathy.  Skin:    General: Skin is warm and dry.     Capillary Refill: Capillary refill takes less than 2 seconds.  Neurological:     Mental Status: She is alert and oriented to person, place, and time.  Psychiatric:        Behavior: Behavior normal.      Musculoskeletal Exam: C-spine has slightly limited range of motion with lateral rotation.  Shoulder joints, elbow joints, wrist joints, MCPs, PIPs, DIPs have good range of motion with no synovitis.  Complete fist formation bilaterally.  Hip joints have good range of motion with no groin pain.  Knee joints have good range of motion no warmth or effusion.  Ankle joints have good range of motion with no tenderness or joint swelling.  CDAI Exam: CDAI Score: -- Patient Global: --; Provider Global: -- Swollen: --; Tender: -- Joint Exam 05/09/2024   No joint exam has been documented for this visit   There is currently no information documented on the homunculus. Go to the Rheumatology activity and complete the homunculus joint  exam.  Investigation: No additional findings.  Imaging: No results found.  Recent Labs: Lab Results  Component Value Date   WBC 6.7 02/18/2024   HGB 14.9 02/18/2024   PLT 302 02/18/2024   NA 141 02/18/2024   K 5.0 02/18/2024   CL 103 02/18/2024   CO2 32 02/18/2024   GLUCOSE 101 (H) 02/18/2024   BUN 18 02/18/2024   CREATININE 0.90 02/18/2024   BILITOT 0.7 02/18/2024   ALKPHOS 64 04/30/2021   AST 25 02/18/2024   ALT 20 02/18/2024   PROT 6.7 02/18/2024   ALBUMIN 4.4 04/30/2021   CALCIUM  9.8 02/18/2024   GFRAA 83 03/25/2021   QFTBGOLDPLUS NEGATIVE 09/20/2023    Speciality Comments: No specialty comments available.  Procedures:  No procedures performed Allergies: Peg [polyethylene glycol]   Assessment / Plan:     Visit Diagnoses: Rheumatoid arthritis involving multiple sites with positive rheumatoid factor (  HCC) - +RF, +CCP: She has no joint tenderness or synovitis on examination today.  She has not had any signs or symptoms of a rheumatoid arthritis flare.  She has not been experiencing any morning stiffness, nocturnal pain, or difficulty performing ADLs.  She has clinically been doing well on Hyrimoz  40 mg subcutaneous injections every 21 days.  She has not been experiencing any breakthrough symptoms as she has been spacing the dose of Hyrimoz .  Patient would like to try further spacing the dose to every 28 days.  She will notify us  if she develops any new or worsening symptoms.  She will follow-up in the office in 5 months or sooner if needed.  High risk medication use - Patient plans to start trying to space Hyrimoz  40 mg sq injections every 28 days.   CBC and CMP updated on 02/18/24. Orders for CBC and CMP released today.  TB gold negative on 09/20/23.  Lipid panel updated on 02/18/24.  No recent or recurrent infections. Discussed the importance of holding hyrimoz  if she develops signs or symptoms of an infection and to resume once the infection has completely cleared.   Discussed the importance of yearly skin examinations while on Hyrimoz .  - Plan: CBC with Differential/Platelet, Comprehensive metabolic panel with GFR  Dyslipidemia: Lipid panel obtained on 02/18/2024: Total cholesterol 283, triglycerides 222, LDL 168.  Other fatigue: Chronic, stable.  Other medical conditions are listed as follows:  TIA (transient ischemic attack) - February 2022.  She takes aspirin  81 mg p.o. daily.  Narcolepsy due to underlying condition without cataplexy  Postmenopausal - DEXA scan on May 18, 2018 which was within normal limits.  DEXA order placed  Attention deficit hyperactivity disorder (ADHD), predominantly inattentive type  Orders: Orders Placed This Encounter  Procedures   CBC with Differential/Platelet   Comprehensive metabolic panel with GFR   No orders of the defined types were placed in this encounter.    Follow-Up Instructions: Return in about 5 months (around 10/09/2024) for Rheumatoid arthritis.   Waddell CHRISTELLA Craze, PA-C  Note - This record has been created using Dragon software.  Chart creation errors have been sought, but may not always  have been located. Such creation errors do not reflect on  the standard of medical care.

## 2024-05-09 ENCOUNTER — Encounter: Payer: Self-pay | Admitting: Physician Assistant

## 2024-05-09 ENCOUNTER — Ambulatory Visit: Attending: Physician Assistant | Admitting: Physician Assistant

## 2024-05-09 VITALS — BP 112/75 | HR 66 | Resp 16 | Ht 63.0 in | Wt 134.6 lb

## 2024-05-09 DIAGNOSIS — F900 Attention-deficit hyperactivity disorder, predominantly inattentive type: Secondary | ICD-10-CM

## 2024-05-09 DIAGNOSIS — E785 Hyperlipidemia, unspecified: Secondary | ICD-10-CM | POA: Diagnosis not present

## 2024-05-09 DIAGNOSIS — Z79899 Other long term (current) drug therapy: Secondary | ICD-10-CM

## 2024-05-09 DIAGNOSIS — G459 Transient cerebral ischemic attack, unspecified: Secondary | ICD-10-CM

## 2024-05-09 DIAGNOSIS — M0579 Rheumatoid arthritis with rheumatoid factor of multiple sites without organ or systems involvement: Secondary | ICD-10-CM | POA: Diagnosis not present

## 2024-05-09 DIAGNOSIS — R5383 Other fatigue: Secondary | ICD-10-CM

## 2024-05-09 DIAGNOSIS — Z780 Asymptomatic menopausal state: Secondary | ICD-10-CM

## 2024-05-09 DIAGNOSIS — G47429 Narcolepsy in conditions classified elsewhere without cataplexy: Secondary | ICD-10-CM

## 2024-05-09 NOTE — Patient Instructions (Signed)
 Standing Labs We placed an order today for your standing lab work.   Please have your standing labs drawn in November and every 3 months   Please have your labs drawn 2 weeks prior to your appointment so that the provider can discuss your lab results at your appointment, if possible.  Please note that you may see your imaging and lab results in MyChart before we have reviewed them. We will contact you once all results are reviewed. Please allow our office up to 72 hours to thoroughly review all of the results before contacting the office for clarification of your results.  WALK-IN LAB HOURS  Monday through Thursday from 8:00 am -12:30 pm and 1:00 pm-4:30 pm and Friday from 8:00 am-12:00 pm.  Patients with office visits requiring labs will be seen before walk-in labs.  You may encounter longer than normal wait times. Please allow additional time. Wait times may be shorter on  Monday and Thursday afternoons.  We do not book appointments for walk-in labs. We appreciate your patience and understanding with our staff.   Labs are drawn by [facility]. Please bring your co-pay at the time of your lab draw.  You may receive a bill from [facility] for your lab work.  Please note if you are on Hydroxychloroquine and and an order has been placed for a Hydroxychloroquine level,  you will need to have it drawn 4 hours or more after your last dose.  If you wish to have your labs drawn at another location, please call the office 24 hours in advance so we can fax the orders.  The office is located at [address], [address], [place], [place] [address]   If you have any questions regarding directions or hours of operation,  please call [phone].   As a reminder, please drink plenty of water  prior to coming for your lab work. Thanks!

## 2024-05-10 ENCOUNTER — Other Ambulatory Visit: Payer: Self-pay | Admitting: Physician Assistant

## 2024-05-10 ENCOUNTER — Ambulatory Visit: Payer: Self-pay | Admitting: Physician Assistant

## 2024-05-10 DIAGNOSIS — M0579 Rheumatoid arthritis with rheumatoid factor of multiple sites without organ or systems involvement: Secondary | ICD-10-CM

## 2024-05-10 LAB — CBC WITH DIFFERENTIAL/PLATELET
Absolute Lymphocytes: 2313 {cells}/uL (ref 850–3900)
Absolute Monocytes: 625 {cells}/uL (ref 200–950)
Basophils Absolute: 59 {cells}/uL (ref 0–200)
Basophils Relative: 1 %
Eosinophils Absolute: 89 {cells}/uL (ref 15–500)
Eosinophils Relative: 1.5 %
HCT: 45.6 % — ABNORMAL HIGH (ref 35.0–45.0)
Hemoglobin: 15.1 g/dL (ref 11.7–15.5)
MCH: 30.3 pg (ref 27.0–33.0)
MCHC: 33.1 g/dL (ref 32.0–36.0)
MCV: 91.6 fL (ref 80.0–100.0)
MPV: 11.1 fL (ref 7.5–12.5)
Monocytes Relative: 10.6 %
Neutro Abs: 2814 {cells}/uL (ref 1500–7800)
Neutrophils Relative %: 47.7 %
Platelets: 312 Thousand/uL (ref 140–400)
RBC: 4.98 Million/uL (ref 3.80–5.10)
RDW: 11.8 % (ref 11.0–15.0)
Total Lymphocyte: 39.2 %
WBC: 5.9 Thousand/uL (ref 3.8–10.8)

## 2024-05-10 LAB — COMPREHENSIVE METABOLIC PANEL WITH GFR
AG Ratio: 1.9 (calc) (ref 1.0–2.5)
ALT: 20 U/L (ref 6–29)
AST: 24 U/L (ref 10–35)
Albumin: 4.4 g/dL (ref 3.6–5.1)
Alkaline phosphatase (APISO): 69 U/L (ref 37–153)
BUN: 20 mg/dL (ref 7–25)
CO2: 27 mmol/L (ref 20–32)
Calcium: 9.6 mg/dL (ref 8.6–10.4)
Chloride: 103 mmol/L (ref 98–110)
Creat: 0.71 mg/dL (ref 0.50–1.03)
Globulin: 2.3 g/dL (ref 1.9–3.7)
Glucose, Bld: 88 mg/dL (ref 65–99)
Potassium: 4.8 mmol/L (ref 3.5–5.3)
Sodium: 138 mmol/L (ref 135–146)
Total Bilirubin: 0.7 mg/dL (ref 0.2–1.2)
Total Protein: 6.7 g/dL (ref 6.1–8.1)
eGFR: 100 mL/min/1.73m2 (ref >=60)

## 2024-05-10 NOTE — Telephone Encounter (Signed)
 Plan to keep the prescription written for every 21 days since the patient is just initiating a trial of spacing to every 28 days for now.

## 2024-05-10 NOTE — Telephone Encounter (Signed)
 Last Fill: 03/23/2024 (60 day supply)  Labs: 05/09/2024 CMP WNL Hematocrit remains borderline elevated but unchanged/stable. Rest of CBC WNL.  TB Gold: 09/20/2023 Neg    Next Visit: 10/09/2024  Last Visit: 05/09/2024  DX: Rheumatoid arthritis involving multiple sites with positive rheumatoid factor   Current Dose per office note 05/09/2024: Hyrimoz  40 mg sq injections every 28 days.    Okay to refill Hyrimoz ?

## 2024-05-10 NOTE — Progress Notes (Signed)
 CMP WNL Hematocrit remains borderline elevated but unchanged/stable. Rest of CBC WNL.

## 2024-05-23 ENCOUNTER — Telehealth: Payer: BC Managed Care – PPO | Admitting: Adult Health

## 2024-05-23 DIAGNOSIS — G47419 Narcolepsy without cataplexy: Secondary | ICD-10-CM | POA: Diagnosis not present

## 2024-05-23 NOTE — Progress Notes (Signed)
 PATIENT: Kathleen Alvarez DOB: 23-Mar-1966  REASON FOR VISIT: follow up HISTORY FROM: patient  Virtual Visit via Video Note  I connected with Kathleen Alvarez on 05/23/24 at  1:45 PM EDT by a video enabled telemedicine application located remotely at [facility] and verified that I am speaking with the correct person using two identifiers who was located at their work in [facility]   I discussed the limitations of evaluation and management by telemedicine and the availability of in person appointments. The patient expressed understanding and agreed to proceed.   PATIENT: Kathleen Alvarez DOB: 11-26-1965  REASON FOR VISIT: follow up HISTORY FROM: patient  HISTORY OF PRESENT ILLNESS: Today 05/23/24  Kathleen Alvarez is a 58 y.o. 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 Returns today for follow-up.  She remains on Vyvanse  70 mg daily.  She reports that this is working well for her.  She states that she cannot go without it.  In the past Dr. Chalice has given her a prescription of Adderall to use in the event she cannot get Vyvanse .  However the patient states that since she is switched her pharmacy to [facility] she has not had any delays in getting her medication.  She denies any cataplectic events.  She does have regular follow-ups with her PCP.  Denies any issues with palpitations, heart rate or blood pressure.     HISTORY 09/20/23   Kathleen Alvarez is a 58 y.o. 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 Returns today for follow-up.  She is currently on Vyvanse  70 mg capsule daily.  She states that this works well for her.  Denies falling asleep while driving or working.  She states that she cannot go a day without it.  She states that Dr. Chalice has given her a prescription of Adderall 30 mg tablets to use only if she is unable to get her Vyvanse  on time. She states that she will have to miss work if she does not have any  medication to take.   HISTORY 07/13/22: (Copied from Dr.Dohmeier's note) Rv with Kathleen Alvarez, a 58 year-old female here for follow-up on narcolepsy without cataplexy.  The patient has been treated with initially Adderall and later Vyvanse  to tolerates this medication very well.  That there was a recent shortage and she had to switch on the generic form at 70 mg daily po.  She is doing well on this generic form of Vyvanse .  Doing the bridge over.  She was on 30 mg of Adderall and did not feel that this left up to her expectation.  She has been sleeping- with 1 or 2  interruptions each night. On weekends she sleeps into the 9 AM range. Weekdays up at 7 AM. Naps in daytime  Vivid dreams are present, no cataplexy, Effexor controlled the lucid dreaming. 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  Here for refills.  The Epworth Sleepiness Scale was  endorsed at 16/24  points  when not medications- On medication; the Epworth score is today at 10/ 24 points.   with the 2 highest ratings for sitting and reading at 3 and lying down in the afternoon with 3 points.  Sitting and talking to someone is only endorsed at 1 point as is driving her own car.  She has not had sleep attacks while driving.  Without medication however she would be in danger.     Her fit fatigue severity scale was endorsed  at   30/ 63 while on meds.     REVIEW OF SYSTEMS: Out of a complete 14 system review of symptoms, the patient complains only of the following symptoms, and all other reviewed systems are negative.  ALLERGIES: Allergies  Allergen Reactions   Peg [Polyethylene Glycol] Itching and Nausea And Vomiting    Used when had colonoscopy prep    HOME MEDICATIONS: Outpatient Medications Prior to Visit  Medication Sig Dispense Refill   ACETYLCHOLINE CHLORIDE IO Take 1 capsule by mouth daily.     acetylcysteine (MUCOMYST) 10 % nebulizer solution Take 4 mLs by nebulization.     Adalimumab -adaz 40 MG/0.4ML SOAJ Inject 0.4 mLs  into the skin every 21 ( twenty-one) days. 0.8 mL 0   albuterol (PROVENTIL HFA;VENTOLIN HFA) 108 (90 BASE) MCG/ACT inhaler Inhale 2 puffs into the lungs every 6 (six) hours as needed. For exercise-induced bronchospasm     amphetamine -dextroamphetamine  (ADDERALL) 30 MG tablet Take 1 tablet by mouth daily. (Patient taking differently: Take 30 mg by mouth as needed.) 5 tablet 0   ASPIRIN  LOW DOSE 81 MG tablet TAKE 1 TABLET BY MOUTH EVERY DAY 90 tablet 3   BIJUVA 1-100 MG CAPS Take 1 capsule by mouth daily. (Patient not taking: Reported on 05/09/2024)     cholecalciferol (VITAMIN D ) 1000 UNITS tablet Take 5,000 Units by mouth. Monday-Friday (Patient not taking: Reported on 05/09/2024)     co-enzyme Q-10 30 MG capsule Take 30 mg by mouth 3 (three) times daily. (Patient taking differently: Take 30 mg by mouth 2 (two) times daily as needed.)     COD LIVER OIL PO Take by mouth.     Creatinine POWD by Does not apply route.     DHEA 25 MG CAPS Take by mouth.     estradiol (VIVELLE-DOT) 0.1 MG/24HR patch 1 patch 2 (two) times a week.     ibuprofen  (ADVIL ) 800 MG tablet Take 1 tablet (800 mg total) by mouth every 8 (eight) hours as needed. (Patient taking differently: Take 800 mg by mouth as needed for headache or mild pain (pain score 1-3).) 12 tablet 0   lisdexamfetamine (VYVANSE ) 70 MG capsule Take 1 capsule (70 mg total) by mouth daily. 90 capsule 0   Misc Natural Products (PROGESTERONE EX) Take 1 mL by mouth daily.     Multiple Vitamin (MULTIVITAMIN WITH MINERALS) TABS Take 1 tablet by mouth daily.     NON FORMULARY Take 1 capsule by mouth daily. Tributyrin 350 (Patient not taking: Reported on 05/09/2024)     NON FORMULARY Take 1 capsule by mouth daily. Homocysteine (Patient not taking: Reported on 05/09/2024)     NON FORMULARY Take 1 capsule by mouth daily. Essential aminos     Omega-3 Fatty Acids (FISH OIL PO) Take 2,400 mg by mouth in the morning and at bedtime. (Patient not taking: Reported on 05/09/2024)      Probiotic Product (PROBIOTIC DAILY PO) Take 1 capsule by mouth as needed. (Patient not taking: Reported on 05/09/2024)     rosuvastatin  (CRESTOR ) 5 MG tablet TAKE 1 TABLET BY MOUTH EVERYDAY AT BEDTIME (Patient not taking: Reported on 05/09/2024) 90 tablet 1   UNABLE TO FIND Med Name: Super methyl     venlafaxine (EFFEXOR) 37.5 MG tablet Take 37.5 mg by mouth daily.     No facility-administered medications prior to visit.    PAST MEDICAL HISTORY: Past Medical History:  Diagnosis Date   Anxiety    Hyperlipidemia    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.00)  09/19/2014   RA (rheumatoid arthritis) (HCC)    TIA (transient ischemic attack)     PAST SURGICAL HISTORY: Past Surgical History:  Procedure Laterality Date   CESAREAN SECTION     x2   COLONOSCOPY  2009   family history of colon ca   DILITATION & CURRETTAGE/HYSTROSCOPY WITH NOVASURE ABLATION  08/26/2012   Procedure: DILATATION & CURETTAGE/HYSTEROSCOPY WITH NOVASURE ABLATION;  Surgeon: Charlie JINNY Flowers, MD;  Location: [facility];  Service: Gynecology;  Laterality: N/A;   lipoma      WISDOM TOOTH EXTRACTION      FAMILY HISTORY: Family History  Problem Relation Age of Onset   Other Mother        gallbladder surgery   Colon cancer Father        deceased   Liver cancer Father 54       deceased    Gout Brother     SOCIAL HISTORY: Social History   Socioeconomic History   Marital status: Married    Spouse name: Arley    Number of children: 2   Years of education: 12+   Highest education level: Not on file  Occupational History   Occupation: Photographer: [employer]  Tobacco Use   Smoking status: Never    Passive exposure: Never   Smokeless tobacco: Never  Vaping Use   Vaping status: Never Used  Substance and Sexual Activity   Alcohol use: Yes    Comment: occ.   Drug use: No   Sexual activity: Not on file  Other Topics Concern   Not on file  Social History Narrative   Patient is employed.    Patient  married with 2 children.      Social Drivers of Corporate investment banker Strain: Not on file  Food Insecurity: Not on file  Transportation Needs: Not on file  Physical Activity: Not on file  Stress: Not on file  Social Connections: Not on file  Intimate Partner Violence: Not on file      PHYSICAL EXAM Generalized: Well developed, in no acute distress   Neurological examination  Mentation: Alert oriented to time, place, history taking. Follows all commands speech and language fluent Cranial nerve II-XII: Facial symmetry noted.   DIAGNOSTIC DATA (LABS, IMAGING, TESTING) - I reviewed patient records, labs, notes, testing and imaging myself where available.  Lab Results  Component Value Date   WBC 5.9 05/09/2024   HGB 15.1 05/09/2024   HCT 45.6 (H) 05/09/2024   MCV 91.6 05/09/2024   PLT 312 05/09/2024      Component Value Date/Time   NA 138 05/09/2024 1331   NA 137 04/30/2021 1015   K 4.8 05/09/2024 1331   CL 103 05/09/2024 1331   CO2 27 05/09/2024 1331   GLUCOSE 88 05/09/2024 1331   BUN 20 05/09/2024 1331   BUN 12 04/30/2021 1015   CREATININE 0.71 05/09/2024 1331   CALCIUM  9.6 05/09/2024 1331   PROT 6.7 05/09/2024 1331   PROT 6.5 04/30/2021 1015   ALBUMIN 4.4 04/30/2021 1015   AST 24 05/09/2024 1331   ALT 20 05/09/2024 1331   ALKPHOS 64 04/30/2021 1015   BILITOT 0.7 05/09/2024 1331   BILITOT 0.3 04/30/2021 1015   GFRNONAA 72 03/25/2021 1450   GFRAA 83 03/25/2021 1450   Lab Results  Component Value Date   CHOL 283 (H) 02/18/2024   HDL 77 02/18/2024   LDLCALC 168 (H) 02/18/2024   LDLDIRECT 98 04/30/2021  TRIG 222 (H) 02/18/2024   CHOLHDL 3.7 02/18/2024     ASSESSMENT AND PLAN 58 y.o. year old female  has a past medical history of Anxiety, Hyperlipidemia, 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.00) (09/19/2014), RA (rheumatoid arthritis) (HCC), and TIA (transient ischemic attack). here with:  Narcolepsy without cataplexy  Continue Vyvanse  70 mg  daily Advised if symptoms worsen or she develops new symptoms she should let us  know Keep regular follow-ups with PCP Follow-up in 6 to 8 months or sooner if needed    Duwaine Russell, MSN, NP-C 05/23/2024, 1:47 PM [facility] [address] [place], [place] [address] [phone]

## 2024-05-29 ENCOUNTER — Telehealth: Payer: Self-pay

## 2024-05-29 NOTE — Telephone Encounter (Signed)
 Pt contacted office stating that she was receiving notifications from [facility] that there was processing issues with her medication and the listed resolution options included both submitting a PA as well as sending in an Rx written as DAW. Dispense history shows that pt was previously filling name-brand Hyrimoz , however it appears to have been switched to the generic as of 04/18/24 and seems to have been filled successfully twice.   Contacted [facility] and spoke with rep who confirms that there were indeed no issues filling the generic formulation of the medication and that it was a preemptive test claim that revealed that the pt's current PA would expire prior to the pt's next fill.   Submitted a Prior Authorization request to [facility] for generic HYRIMOZ  via CoverMyMeds. Will update once we receive a response.  Key: [id number]

## 2024-05-29 NOTE — Telephone Encounter (Signed)
 Received notification from [facility] regarding a prior authorization for HYRIMOZ . Authorization has been APPROVED from 05/29/2024 to 05/29/2025. Approval letter sent to scan center. Copy of approval letter also faxed to [facility].  Authorization # [id number]

## 2024-06-10 ENCOUNTER — Other Ambulatory Visit (HOSPITAL_BASED_OUTPATIENT_CLINIC_OR_DEPARTMENT_OTHER): Payer: Self-pay

## 2024-06-10 ENCOUNTER — Other Ambulatory Visit: Payer: Self-pay | Admitting: Neurology

## 2024-06-10 MED ORDER — LISDEXAMFETAMINE DIMESYLATE 70 MG PO CAPS
70.0000 mg | ORAL_CAPSULE | Freq: Every day | ORAL | 0 refills | Status: DC
  Filled 2024-06-10: qty 90, 90d supply, fill #0

## 2024-06-13 ENCOUNTER — Other Ambulatory Visit: Payer: Self-pay | Admitting: Physician Assistant

## 2024-06-13 DIAGNOSIS — M0579 Rheumatoid arthritis with rheumatoid factor of multiple sites without organ or systems involvement: Secondary | ICD-10-CM

## 2024-06-13 NOTE — Telephone Encounter (Signed)
 Last Fill: 05/10/2024  Labs: 05/09/2025 CMP WNL Hematocrit remains borderline elevated but unchanged/stable. Rest of CBC WNL  TB Gold: 09/20/2023 TB Gold Negative   Next Visit: 10/09/2024  Last Visit: 05/09/2024  IK:Myzlfjunpi arthritis involving multiple sites with positive rheumatoid factor   Current Dose per office note 05/09/2024: Hyrimoz  40 mg sq injections every 28 days.    Okay to refill Hyrimoz ?  Contacted patient to confirm dosing , patient is doing every 28 days not every 21 days. Patient also requested we dispense it as written so the pharmacy don't give her the name brand an charge her 100$+ for her prescription.

## 2024-09-03 ENCOUNTER — Encounter: Payer: Self-pay | Admitting: Adult Health

## 2024-09-04 ENCOUNTER — Other Ambulatory Visit (HOSPITAL_BASED_OUTPATIENT_CLINIC_OR_DEPARTMENT_OTHER): Payer: Self-pay

## 2024-09-04 MED ORDER — LISDEXAMFETAMINE DIMESYLATE 70 MG PO CAPS
70.0000 mg | ORAL_CAPSULE | Freq: Every day | ORAL | 0 refills | Status: DC
  Filled 2024-09-04 – 2024-09-06 (×2): qty 30, 30d supply, fill #0

## 2024-09-06 ENCOUNTER — Other Ambulatory Visit: Payer: Self-pay

## 2024-09-06 ENCOUNTER — Other Ambulatory Visit (HOSPITAL_BASED_OUTPATIENT_CLINIC_OR_DEPARTMENT_OTHER): Payer: Self-pay

## 2024-09-06 NOTE — Addendum Note (Signed)
 Addended by: SHONA SAVANT A on: 09/06/2024 11:56 AM   Modules accepted: Orders

## 2024-09-09 ENCOUNTER — Other Ambulatory Visit (HOSPITAL_BASED_OUTPATIENT_CLINIC_OR_DEPARTMENT_OTHER): Payer: Self-pay

## 2024-09-09 MED ORDER — LISDEXAMFETAMINE DIMESYLATE 70 MG PO CAPS
70.0000 mg | ORAL_CAPSULE | Freq: Every day | ORAL | 0 refills | Status: AC
  Filled 2024-09-09: qty 90, 90d supply, fill #0
  Filled 2024-10-04: qty 60, 60d supply, fill #0

## 2024-09-09 NOTE — Addendum Note (Signed)
 Addended by: CHALICE SAUNAS on: 09/09/2024 03:59 PM   Modules accepted: Orders

## 2024-09-26 NOTE — Progress Notes (Signed)
 "  Office Visit Note  Patient: Kathleen Alvarez             Date of Birth: 24-Sep-1966           MRN: [id number]             PCP: Regino Slater, MD Referring: Regino Slater, MD Visit Date: 10/09/2024 Occupation: [employer]  Subjective:  Flare   History of Present Illness: Kathleen Alvarez is a 58 y.o. female with history of seropositive rheumatoid arthritis.  Patient remains on Hyrimoz  40 mg sq injections every 14 days.  Patient states that she was previously spacing the dosing of Hyrimoz  to every 28 days but over the past 1 month she has been more symptomatic.  Patient states that she administered Hyrimoz  on 09/09/2024, 09/16/2024, and again on 10/07/2024. Patient states for the past 1 month she has had increased pain and stiffness affecting the cervical spine as well as increased pain in the right knee.  Patient reports that she was evaluated at [facility] and had a right knee MRI performed on 09/25/2024.  Patient states that she had a cortisone injection performed around Christmas time which helped to alleviate the pain and swelling she was experiencing.  Patient states that she had to have fluid drawn off.   She continues to have residual pain and inflammation in the right wrist and hand. Patient states that she has tried to remain active continuing to play pickle ball as well as going to [employer] but is feeling discouraged since she has been unable to do as many things that she would like to do due to the severity of symptoms.   Activities of Daily Living:  Patient reports morning stiffness for 1-2 hours.   Patient Reports nocturnal pain.  Difficulty dressing/grooming: Denies Difficulty climbing stairs: Denies Difficulty getting out of chair: Denies Difficulty using hands for taps, buttons, cutlery, and/or writing: Reports  Review of Systems  Constitutional:  Positive for fatigue.  HENT:  Negative for mouth sores and mouth dryness.   Eyes:  Negative for dryness.  Respiratory:  Negative  for shortness of breath.   Cardiovascular:  Negative for chest pain and palpitations.  Gastrointestinal:  Negative for blood in stool, constipation and diarrhea.  Endocrine: Negative for increased urination.  Genitourinary:  Negative for involuntary urination.  Musculoskeletal:  Positive for joint pain, joint pain, joint swelling and morning stiffness. Negative for gait problem, myalgias, muscle weakness, muscle tenderness and myalgias.  Skin:  Negative for color change, rash, hair loss and sensitivity to sunlight.  Allergic/Immunologic: Negative for susceptible to infections.  Neurological:  Negative for dizziness and headaches.  Hematological:  Negative for swollen glands.  Psychiatric/Behavioral:  Positive for depressed mood and sleep disturbance. The patient is nervous/anxious.     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    Past Medical History:  Diagnosis Date   Anxiety    Hyperlipidemia    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   RA (rheumatoid arthritis) (HCC)  TIA (transient ischemic attack)     Family History  Problem Relation Age of Onset   Other Mother        gallbladder surgery   Colon cancer Father        deceased   Liver cancer Father 74       deceased    Gout Brother    Past Surgical History:  Procedure Laterality Date   CESAREAN SECTION     x2   COLONOSCOPY  2009   family history  of colon ca   DILITATION & CURRETTAGE/HYSTROSCOPY WITH NOVASURE ABLATION  08/26/2012   Procedure: DILATATION & CURETTAGE/HYSTEROSCOPY WITH NOVASURE ABLATION;  Surgeon: Charlie JINNY Flowers, MD;  Location: [facility];  Service: Gynecology;  Laterality: N/A;   lipoma      WISDOM TOOTH EXTRACTION     Social History[1] Social History   Social History Narrative   Patient is employed.    Patient married with 2 children.        Immunization History  Administered Date(s) Administered   Influenza, Seasonal, Injecte, Preservative Fre 08/31/2014   Influenza,inj,Quad PF,6+ Mos 08/22/2019, 09/19/2020   Moderna Sars-Covid-2 Vaccination 12/14/2019, 01/16/2020, 09/23/2020     Objective: Vital Signs: BP 122/80   Pulse 71   Temp 97.6 F (36.4 C)   Resp 15   Ht 5' 3 (1.6 m)   Wt 141 lb 12.8 oz (64.3 kg)   BMI 25.12 kg/m    Physical Exam Vitals and nursing note reviewed.  Constitutional:      Appearance: She is well-developed.  HENT:     Head: Normocephalic and atraumatic.  Eyes:     Conjunctiva/sclera: Conjunctivae normal.  Cardiovascular:     Rate and Rhythm: Normal rate and regular rhythm.     Heart sounds: Normal heart sounds.  Pulmonary:     Effort: Pulmonary effort is normal.     Breath sounds: Normal breath sounds.  Abdominal:     General: Bowel sounds are normal.     Palpations: Abdomen is soft.  Musculoskeletal:     Cervical back: Normal range of motion.  Lymphadenopathy:     Cervical: No cervical adenopathy.  Skin:    General: Skin is warm and dry.     Capillary Refill: Capillary refill takes less than 2 seconds.  Neurological:     Mental Status: She is alert and oriented to person, place, and time.  Psychiatric:        Behavior: Behavior normal.      Musculoskeletal Exam: C-spine has painful limited range of motion with lateral rotation especially to the left.  Shoulder joints and elbow joints have good range of motion.  Tenderness and inflammation of the right wrist.   Tenderness of the left wrist.  Inflammation noted in several MCP joints.  Complete fist formation bilaterally.  Hip joints have good range of motion with no groin pain.  Knee joints have good range of motion no warmth or effusion.  Ankle joints have good range of motion no tenderness or joint swelling.     CDAI Exam: CDAI Score: -- Patient Global: --; Provider Global: -- Swollen: 6 ; Tender: 8  Joint Exam 10/09/2024      Right  Left  Wrist  Swollen Tender   Tender  MCP 1  Swollen Tender  Swollen Tender  MCP 2  Swollen Tender     MCP 3  Swollen Tender     IP (thumb)  Swollen Tender     Cervical Spine  Tender        Investigation: No additional findings.  Imaging: No results found.  Recent Labs: Lab Results  Component Value Date   WBC 5.9 05/09/2024   HGB 15.1 05/09/2024   PLT 312 05/09/2024   NA 138 05/09/2024   K 4.8 05/09/2024   CL 103 05/09/2024   CO2 27 05/09/2024   GLUCOSE 88 05/09/2024   BUN 20 05/09/2024   CREATININE 0.71 05/09/2024   BILITOT 0.7 05/09/2024   ALKPHOS 64 04/30/2021   AST 24 05/09/2024   ALT 20 05/09/2024   PROT 6.7 05/09/2024   ALBUMIN 4.4 04/30/2021   CALCIUM  9.6 05/09/2024   GFRAA 83 03/25/2021   QFTBGOLDPLUS NEGATIVE 09/20/2023    Speciality Comments: No specialty comments available.  Procedures:  No procedures performed Allergies: Peg [polyethylene glycol (macrogol)]   Assessment / Plan:     Visit Diagnoses: Rheumatoid arthritis involving multiple sites with positive rheumatoid factor (HCC) - +RF, +CCP: Patient presents today experiencing a flare affecting both hands and both wrist joints, right worse than left.  She was previously spacing Hyrimoz  to every 28 days but has had to increase the frequency of dosing due to the increase in symptoms.  She had a dose of Hyrimoz  administered on 09/09/2024, 09/16/2024, and 10/07/2024.  She has tried taking ibuprofen  as needed for symptomatic relief. She had a right knee joint aspiration and  cortisone injection performed in December 2025 performed at [facility].  Patient had MRI of the right knee on 09/25/2024 and was told that she has a meniscal tear as well as degenerative change.  Her right knee joint pain has started to subside.  She has been trying to remain active including playing pickle ball and going to [employer].  Offered to send a prednisone  taper to the pharmacy today but she would like to hold off.  She plans on using Voltaren gel topically for pain relief.  She will notify us  if the symptoms persist or worsen.  Patient was encouraged to remain on Hyrimoz  injections every 14 days.  Discussed that if she continues to have recurrent flares we can discuss treatment alternatives.- Plan: Adalimumab -adaz 40 MG/0.4ML SOAJ  High risk medication use - Hyrimoz  40 mg sq injections every 14 days. CBC and CMP updated on 05/09/24. Orders for CBC and CMP released today.   TB Gold negative on 09/20/23. Order for TB gold released today.  Discussed the importance of holding hyrimoz  if she develops signs or symptoms of an infection and to resume once the infection has completely cleared.   - Plan: QuantiFERON-TB Gold Plus, CBC with Differential/Platelet, Comprehensive metabolic panel with GFR  Screening for tuberculosis -Order for TB gold released today. Plan: QuantiFERON-TB Gold Plus  Dyslipidemia - Lipid panel obtained on 02/18/2024: Total cholesterol 283, triglycerides 222, LDL 168.  Other fatigue:Patient remains active exercising on a regular basis.   Other medical conditions are listed as follows:   Narcolepsy due to underlying condition without cataplexy  TIA (transient ischemic attack)  Attention deficit hyperactivity disorder (ADHD), predominantly inattentive type  Postmenopausal - - DEXA scan on May 18, 2018 which was within normal li     Orders: Orders Placed This Encounter  Procedures   QuantiFERON-TB Gold Plus   CBC with Differential/Platelet   Comprehensive  metabolic panel with GFR   Meds ordered this encounter  Medications   Adalimumab -adaz 40 MG/0.4ML SOAJ    Sig: Inject 40 mg into the skin every 28 (twenty-eight) days.    Dispense:  2.4 mL    Refill:  0    Prescription Type::   Renewal     Follow-Up Instructions: Return in about 5 months (around 03/09/2025) for Rheumatoid arthritis.   Waddell CHRISTELLA Craze, PA-C  Note - This record has been created using Dragon software.  Chart creation errors have been sought, but may not always  have been located. Such creation errors do not reflect on  the standard of medical care.     [1]  Social History Tobacco Use   Smoking status: Never    Passive exposure: Never   Smokeless tobacco: Never  Vaping Use   Vaping status: Never Used  Substance Use Topics   Alcohol use: Yes    Comment: occ.   Drug use: No   "

## 2024-10-03 NOTE — Telephone Encounter (Signed)
 Medication was sent in twice called the patient to verify that medication was sent to the correct pharmacy.

## 2024-10-04 ENCOUNTER — Other Ambulatory Visit (HOSPITAL_BASED_OUTPATIENT_CLINIC_OR_DEPARTMENT_OTHER): Payer: Self-pay

## 2024-10-06 ENCOUNTER — Telehealth: Payer: Self-pay | Admitting: Physician Assistant

## 2024-10-06 NOTE — Telephone Encounter (Signed)
 Pt called stating she is having pain in her hands (mostly right). Pt would like to know if there is anything she could do for relieve her pain before her appt on Monday.

## 2024-10-06 NOTE — Telephone Encounter (Signed)
 Please clarify if the patient would like a prednisone  taper?

## 2024-10-06 NOTE — Telephone Encounter (Signed)
 Attempted to contact the patient and left a message to call the office back.

## 2024-10-09 ENCOUNTER — Ambulatory Visit: Attending: Physician Assistant | Admitting: Physician Assistant

## 2024-10-09 ENCOUNTER — Encounter: Payer: Self-pay | Admitting: Physician Assistant

## 2024-10-09 ENCOUNTER — Telehealth: Payer: Self-pay | Admitting: Rheumatology

## 2024-10-09 VITALS — BP 122/80 | HR 71 | Temp 97.6°F | Resp 15 | Ht 63.0 in | Wt 141.8 lb

## 2024-10-09 DIAGNOSIS — Z111 Encounter for screening for respiratory tuberculosis: Secondary | ICD-10-CM | POA: Diagnosis not present

## 2024-10-09 DIAGNOSIS — E785 Hyperlipidemia, unspecified: Secondary | ICD-10-CM | POA: Diagnosis not present

## 2024-10-09 DIAGNOSIS — R5383 Other fatigue: Secondary | ICD-10-CM

## 2024-10-09 DIAGNOSIS — G459 Transient cerebral ischemic attack, unspecified: Secondary | ICD-10-CM

## 2024-10-09 DIAGNOSIS — M0579 Rheumatoid arthritis with rheumatoid factor of multiple sites without organ or systems involvement: Secondary | ICD-10-CM | POA: Diagnosis not present

## 2024-10-09 DIAGNOSIS — Z79899 Other long term (current) drug therapy: Secondary | ICD-10-CM | POA: Diagnosis not present

## 2024-10-09 DIAGNOSIS — Z780 Asymptomatic menopausal state: Secondary | ICD-10-CM | POA: Diagnosis not present

## 2024-10-09 DIAGNOSIS — G47429 Narcolepsy in conditions classified elsewhere without cataplexy: Secondary | ICD-10-CM | POA: Diagnosis not present

## 2024-10-09 DIAGNOSIS — F900 Attention-deficit hyperactivity disorder, predominantly inattentive type: Secondary | ICD-10-CM

## 2024-10-09 MED ORDER — ADALIMUMAB-ADAZ 40 MG/0.4ML ~~LOC~~ SOAJ: 40.0000 mg | SUBCUTANEOUS | 0 refills | Status: AC

## 2024-10-09 NOTE — Telephone Encounter (Signed)
See previous telephone encounter for details.

## 2024-10-09 NOTE — Telephone Encounter (Signed)
 Patient left a voicemail (10/06/24 at 3:30 pm) stating she was returning our call.

## 2024-10-09 NOTE — Telephone Encounter (Signed)
 Contacted the patient and patient has an appointment today and will discuss with Waddell further at that visit.

## 2024-10-10 ENCOUNTER — Ambulatory Visit: Payer: Self-pay | Admitting: Physician Assistant

## 2024-10-10 NOTE — Progress Notes (Signed)
 CBC and CMP WNL

## 2024-10-11 LAB — CBC WITH DIFFERENTIAL/PLATELET
Absolute Lymphocytes: 2645 {cells}/uL (ref 850–3900)
Absolute Monocytes: 661 {cells}/uL (ref 200–950)
Basophils Absolute: 78 {cells}/uL (ref 0–200)
Basophils Relative: 0.9 %
Eosinophils Absolute: 96 {cells}/uL (ref 15–500)
Eosinophils Relative: 1.1 %
HCT: 45.1 % (ref 35.9–46.0)
Hemoglobin: 14.7 g/dL (ref 11.7–15.5)
MCH: 29.1 pg (ref 27.0–33.0)
MCHC: 32.6 g/dL (ref 31.6–35.4)
MCV: 89.3 fL (ref 81.4–101.7)
MPV: 10.9 fL (ref 7.5–12.5)
Monocytes Relative: 7.6 %
Neutro Abs: 5220 {cells}/uL (ref 1500–7800)
Neutrophils Relative %: 60 %
Platelets: 340 Thousand/uL (ref 140–400)
RBC: 5.05 Million/uL (ref 3.80–5.10)
RDW: 11.7 % (ref 11.0–15.0)
Total Lymphocyte: 30.4 %
WBC: 8.7 Thousand/uL (ref 3.8–10.8)

## 2024-10-11 LAB — COMPREHENSIVE METABOLIC PANEL WITH GFR
AG Ratio: 1.7 (calc) (ref 1.0–2.5)
ALT: 17 U/L (ref 6–29)
AST: 18 U/L (ref 10–35)
Albumin: 4.3 g/dL (ref 3.6–5.1)
Alkaline phosphatase (APISO): 83 U/L (ref 37–153)
BUN: 23 mg/dL (ref 7–25)
CO2: 26 mmol/L (ref 20–32)
Calcium: 9.2 mg/dL (ref 8.6–10.4)
Chloride: 105 mmol/L (ref 98–110)
Creat: 0.62 mg/dL (ref 0.50–1.03)
Globulin: 2.6 g/dL (ref 1.9–3.7)
Glucose, Bld: 76 mg/dL (ref 65–99)
Potassium: 4.7 mmol/L (ref 3.5–5.3)
Sodium: 141 mmol/L (ref 135–146)
Total Bilirubin: 0.5 mg/dL (ref 0.2–1.2)
Total Protein: 6.9 g/dL (ref 6.1–8.1)
eGFR: 104 mL/min/1.73m2

## 2024-10-11 LAB — QUANTIFERON-TB GOLD PLUS
Mitogen-NIL: 8.22 [IU]/mL
NIL: 0.04 [IU]/mL
QuantiFERON-TB Gold Plus: NEGATIVE
TB1-NIL: 0 [IU]/mL
TB2-NIL: 0 [IU]/mL

## 2024-10-11 NOTE — Progress Notes (Signed)
 TB gold negative.    Please clarify if the patient's symptoms of a flare are improving?

## 2024-10-11 NOTE — Progress Notes (Signed)
 Please clarify if she is taking meloxicam ?   We can send in a short course of prednisone  to alleviate her symptoms if her symptoms are not improving.  She would need to avoid NSAID use while taking prednisone .

## 2024-10-24 ENCOUNTER — Other Ambulatory Visit: Payer: Self-pay | Admitting: Adult Health

## 2025-01-25 ENCOUNTER — Telehealth: Admitting: Adult Health

## 2025-03-12 ENCOUNTER — Ambulatory Visit: Admitting: Physician Assistant

## 2025-03-13 ENCOUNTER — Ambulatory Visit: Admitting: Rheumatology
